# Patient Record
Sex: Male | Born: 1960 | Race: White | Hispanic: No | Marital: Married | Smoking: Current every day smoker
Health system: Southern US, Community
[De-identification: ages and names within clinical notes are randomized; demographics above are authoritative.]

## PROBLEM LIST (undated history)

## (undated) DIAGNOSIS — K219 Gastro-esophageal reflux disease without esophagitis: Secondary | ICD-10-CM

## (undated) DIAGNOSIS — J40 Bronchitis, not specified as acute or chronic: Secondary | ICD-10-CM

## (undated) DIAGNOSIS — E119 Type 2 diabetes mellitus without complications: Secondary | ICD-10-CM

## (undated) DIAGNOSIS — E7800 Pure hypercholesterolemia, unspecified: Secondary | ICD-10-CM

## (undated) DIAGNOSIS — J439 Emphysema, unspecified: Secondary | ICD-10-CM

## (undated) DIAGNOSIS — H269 Unspecified cataract: Secondary | ICD-10-CM

## (undated) DIAGNOSIS — I10 Essential (primary) hypertension: Secondary | ICD-10-CM

## (undated) DIAGNOSIS — M1990 Unspecified osteoarthritis, unspecified site: Secondary | ICD-10-CM

## (undated) HISTORY — DX: Gastro-esophageal reflux disease without esophagitis: K21.9

## (undated) HISTORY — PX: BACK SURGERY: SHX140

## (undated) HISTORY — DX: Unspecified cataract: H26.9

## (undated) HISTORY — PX: EYE SURGERY: SHX253

## (undated) HISTORY — DX: Unspecified osteoarthritis, unspecified site: M19.90

## (undated) HISTORY — DX: Bronchitis, not specified as acute or chronic: J40

## (undated) HISTORY — DX: Emphysema, unspecified: J43.9

## (undated) HISTORY — PX: HERNIA REPAIR: SHX51

---

## 2006-01-27 ENCOUNTER — Ambulatory Visit: Payer: Self-pay | Admitting: Cardiology

## 2007-08-31 ENCOUNTER — Ambulatory Visit (HOSPITAL_COMMUNITY): Admission: RE | Admit: 2007-08-31 | Discharge: 2007-08-31 | Payer: Self-pay | Admitting: Ophthalmology

## 2007-09-14 ENCOUNTER — Ambulatory Visit (HOSPITAL_COMMUNITY): Admission: RE | Admit: 2007-09-14 | Discharge: 2007-09-14 | Payer: Self-pay | Admitting: Ophthalmology

## 2009-02-12 ENCOUNTER — Ambulatory Visit (HOSPITAL_COMMUNITY): Admission: RE | Admit: 2009-02-12 | Discharge: 2009-02-12 | Payer: Self-pay | Admitting: Internal Medicine

## 2009-02-25 ENCOUNTER — Ambulatory Visit (HOSPITAL_COMMUNITY): Admission: RE | Admit: 2009-02-25 | Discharge: 2009-02-25 | Payer: Self-pay | Admitting: Internal Medicine

## 2011-02-09 LAB — BASIC METABOLIC PANEL
Calcium: 9.8
Chloride: 103
Potassium: 4.1

## 2011-11-01 ENCOUNTER — Emergency Department (HOSPITAL_COMMUNITY)
Admission: EM | Admit: 2011-11-01 | Discharge: 2011-11-01 | Disposition: A | Discharge status: Home / Self Care | Payer: Self-pay | Attending: Emergency Medicine | Admitting: Emergency Medicine

## 2011-11-01 ENCOUNTER — Emergency Department (HOSPITAL_COMMUNITY): Payer: Self-pay

## 2011-11-01 ENCOUNTER — Encounter (HOSPITAL_COMMUNITY): Payer: Self-pay | Admitting: *Deleted

## 2011-11-01 DIAGNOSIS — S2249XA Multiple fractures of ribs, unspecified side, initial encounter for closed fracture: Secondary | ICD-10-CM | POA: Insufficient documentation

## 2011-11-01 DIAGNOSIS — X58XXXA Exposure to other specified factors, initial encounter: Secondary | ICD-10-CM | POA: Insufficient documentation

## 2011-11-01 DIAGNOSIS — Z79899 Other long term (current) drug therapy: Secondary | ICD-10-CM | POA: Insufficient documentation

## 2011-11-01 DIAGNOSIS — I10 Essential (primary) hypertension: Secondary | ICD-10-CM | POA: Insufficient documentation

## 2011-11-01 DIAGNOSIS — F17200 Nicotine dependence, unspecified, uncomplicated: Secondary | ICD-10-CM | POA: Insufficient documentation

## 2011-11-01 DIAGNOSIS — E7800 Pure hypercholesterolemia, unspecified: Secondary | ICD-10-CM | POA: Insufficient documentation

## 2011-11-01 DIAGNOSIS — S2239XA Fracture of one rib, unspecified side, initial encounter for closed fracture: Secondary | ICD-10-CM

## 2011-11-01 HISTORY — DX: Essential (primary) hypertension: I10

## 2011-11-01 HISTORY — DX: Pure hypercholesterolemia, unspecified: E78.00

## 2011-11-01 MED ORDER — OXYCODONE-ACETAMINOPHEN 5-325 MG PO TABS: 1.0000 | ORAL_TABLET | ORAL | Status: AC | PRN

## 2011-11-01 MED ORDER — OXYCODONE-ACETAMINOPHEN 5-325 MG PO TABS
1.0000 | ORAL_TABLET | Freq: Once | ORAL | Status: AC
  Administered 2011-11-01: 1 via ORAL
  Filled 2011-11-01: qty 1

## 2011-11-01 NOTE — ED Notes (Signed)
Cough since Friday, pain lt lat ribs esp when coughs.  Nonproductive, No fever.

## 2011-11-01 NOTE — Discharge Instructions (Signed)
Rib Fracture Your caregiver has diagnosed you as having a rib fracture (a break). This can occur by a blow to the chest, by a fall against a hard object, or by violent coughing or sneezing. There may be one or many breaks. Rib fractures may heal on their own within 3 to 8 weeks. The longer healing period is usually associated with a continued cough or other aggravating activities. HOME CARE INSTRUCTIONS   Avoid strenuous activity. Be careful during activities and avoid bumping the injured rib. Activities that cause pain pull on the fracture site(s) and are best avoided if possible.   Eat a normal, well-balanced diet. Drink plenty of fluids to avoid constipation.   Take deep breaths several times a day to keep lungs free of infection. Try to cough several times a day, splinting the injured area with a pillow. This will help prevent pneumonia.   Do not wear a rib belt or binder. These restrict breathing which can lead to pneumonia.   Only take over-the-counter or prescription medicines for pain, discomfort, or fever as directed by your caregiver.  SEEK MEDICAL CARE IF:  You develop a continual cough, associated with thick or bloody sputum. SEEK IMMEDIATE MEDICAL CARE IF:   You have a fever.   You have difficulty breathing.   You have nausea (feeling sick to your stomach), vomiting, or abdominal (belly) pain.   You have worsening pain, not controlled with medications.  Document Released: 05/03/2005 Document Revised: 04/22/2011 Document Reviewed: 10/05/2006 Exit[employer] Patient Information 2012 [employer]. 

## 2011-11-01 NOTE — ED Provider Notes (Signed)
History     CSN: [id number]  Arrival date & time 11/01/11  1916   First MD Initiated Contact with Patient 11/01/11 1926      Chief Complaint  Patient presents with  . Cough    (Consider location/radiation/quality/duration/timing/severity/associated sxs/prior treatment) HPI Comments: Patient c/o localized pain to the lateral left chest wall for three days.  States the pain began after he coughed and felt a sharp pain to his side that worsens with deep breathing, coughing and certain movements.  Pain improves with certain positions.  He denies hemoptysis, fever, dyspnea or productive cough  Patient is a 51 y.o. male presenting with chest pain. The history is provided by the patient.  Chest Pain The chest pain began 3 - 5 days ago. Chest pain occurs constantly. The chest pain is unchanged. The pain is associated with coughing and exertion. The severity of the pain is mild. The quality of the pain is described as dull and sharp. The pain does not radiate. Chest pain is worsened by certain positions, deep breathing and exertion (palpation). Primary symptoms include cough. Pertinent negatives for primary symptoms include no fever, no syncope, no shortness of breath, no wheezing, no palpitations, no abdominal pain, no nausea, no vomiting, no dizziness and no altered mental status. He tried beta-agonist inhalers for the symptoms.  Pertinent negatives for past medical history include no cancer, no diabetes, no MI and no PE.     Past Medical History  Diagnosis Date  . Hypertension   . Hypercholesteremia     Past Surgical History  Procedure Date  . Hernia repair   . Back surgery   . Eye surgery     Family History  Problem Relation Age of Onset  . Cancer Mother     History  Substance Use Topics  . Smoking status: Current Everyday Smoker  . Smokeless tobacco: Not on file  . Alcohol Use: Yes      Review of Systems  Constitutional: Negative for fever and appetite change.    Respiratory: Positive for cough. Negative for chest tightness, shortness of breath and wheezing.   Cardiovascular: Positive for chest pain. Negative for palpitations and syncope.  Gastrointestinal: Negative for nausea, vomiting and abdominal pain.  Musculoskeletal: Negative for back pain.  Skin: Negative.   Neurological: Negative for dizziness.  Psychiatric/Behavioral: Negative for altered mental status.  All other systems reviewed and are negative.    Allergies  Review of patient's allergies indicates no known allergies.  Home Medications   Current Outpatient Rx  Name Route Sig Dispense Refill  . ALBUTEROL SULFATE HFA 108 (90 BASE) MCG/ACT IN AERS Inhalation Inhale 2 puffs into the lungs every 6 (six) hours as needed.    . CYMBALTA PO Oral Take 1 capsule by mouth daily.    Marland Kitchen ESOMEPRAZOLE MAGNESIUM 40 MG PO CPDR Oral Take 40 mg by mouth daily before breakfast.      BP 159/92  Pulse 93  Temp 98.5 F (36.9 C) (Oral)  Resp 20  Ht 5\' 7"  (1.702 m)  Wt 170 lb (77.111 kg)  BMI 26.63 kg/m2  SpO2 97%  Physical Exam  Nursing note and vitals reviewed. Constitutional: He is oriented to person, place, and time. He appears well-developed and well-nourished. No distress.  HENT:  Head: Normocephalic and atraumatic.  Mouth/Throat: Oropharynx is clear and moist.  Cardiovascular: Normal rate, normal heart sounds and intact distal pulses.   No murmur heard. Pulmonary/Chest: Effort normal and breath sounds normal. Not tachypneic. No respiratory distress.  He has no decreased breath sounds. He has no wheezes. He exhibits tenderness and bony tenderness. He exhibits no mass, no laceration, no crepitus, no edema, no deformity, no swelling and no retraction.    Abdominal: Soft. He exhibits no distension. There is no tenderness. There is no rebound and no guarding.  Musculoskeletal: Normal range of motion. He exhibits no edema.  Neurological: He is alert and oriented to person, place, and time.  He exhibits normal muscle tone. Coordination normal.  Skin: Skin is warm and dry.    ED Course  Procedures (including critical care time)  Labs Reviewed - No data to display Dg Ribs Unilateral W/chest Left  11/01/2011  *RADIOLOGY REPORT*  Clinical Data: Cough, left rib pain.  LEFT RIBS AND CHEST - 3+ VIEW  Comparison: 09/13/2011  Findings: There are fractures through the posterior lateral left seventh and eighth ribs.  No pneumothorax.  Heart is borderline in size.  Lungs are clear.  No effusions.  IMPRESSION: Left posterior lateral seventh and eighth rib fractures.  No pneumothorax.  Original Report Authenticated By: Cyndie Chime, M.D.        MDM    Patient has localized tenderness to palpation of the left lateral chest wall. No bruising, abrasions, or crepitus. Patient is not guarding. Vital signs are stable. Patient has a followup appointment with his primary care physician in 2 days. I've advised him to return here for any worsening symptoms.    Patient / Family / Caregiver understand and agree with initial ED impression and plan with expectations set for ED visit. Pt stable in ED with no significant deterioration in condition. Pt feels improved after observation and/or treatment in ED.    Percocet prepack dispensed for home use.   Prescribed: Percocet #24   Adams Hinch L. [place], [place] 11/04/11 2212

## 2011-11-08 MED FILL — Oxycodone w/ Acetaminophen Tab 5-325 MG: ORAL | Qty: 6 | Status: AC

## 2011-11-09 NOTE — ED Provider Notes (Signed)
Medical screening examination/treatment/procedure(s) were performed by non-physician practitioner and as supervising physician I was immediately available for consultation/collaboration.  Donnetta Hutching, MD 11/09/11 1739

## 2013-05-13 ENCOUNTER — Encounter (HOSPITAL_COMMUNITY): Payer: Self-pay | Admitting: Emergency Medicine

## 2013-05-13 ENCOUNTER — Emergency Department (HOSPITAL_COMMUNITY)
Admission: EM | Admit: 2013-05-13 | Discharge: 2013-05-13 | Disposition: A | Discharge status: Home / Self Care | Payer: Self-pay | Attending: Emergency Medicine | Admitting: Emergency Medicine

## 2013-05-13 DIAGNOSIS — Z79899 Other long term (current) drug therapy: Secondary | ICD-10-CM | POA: Insufficient documentation

## 2013-05-13 DIAGNOSIS — L02419 Cutaneous abscess of limb, unspecified: Secondary | ICD-10-CM | POA: Insufficient documentation

## 2013-05-13 DIAGNOSIS — Z862 Personal history of diseases of the blood and blood-forming organs and certain disorders involving the immune mechanism: Secondary | ICD-10-CM | POA: Insufficient documentation

## 2013-05-13 DIAGNOSIS — I10 Essential (primary) hypertension: Secondary | ICD-10-CM | POA: Insufficient documentation

## 2013-05-13 DIAGNOSIS — L02415 Cutaneous abscess of right lower limb: Secondary | ICD-10-CM

## 2013-05-13 DIAGNOSIS — Z8639 Personal history of other endocrine, nutritional and metabolic disease: Secondary | ICD-10-CM | POA: Insufficient documentation

## 2013-05-13 DIAGNOSIS — F17200 Nicotine dependence, unspecified, uncomplicated: Secondary | ICD-10-CM | POA: Insufficient documentation

## 2013-05-13 MED ORDER — DOXYCYCLINE HYCLATE 100 MG PO CAPS: 100.0000 mg | ORAL_CAPSULE | Freq: Two times a day (BID) | ORAL | Status: DC

## 2013-05-13 MED ORDER — DOXYCYCLINE HYCLATE 100 MG PO TABS
100.0000 mg | ORAL_TABLET | Freq: Once | ORAL | Status: AC
  Administered 2013-05-13: 100 mg via ORAL
  Filled 2013-05-13: qty 1

## 2013-05-13 MED ORDER — LIDOCAINE HCL (PF) 2 % IJ SOLN
10.0000 mL | Freq: Once | INTRAMUSCULAR | Status: AC
  Administered 2013-05-13: 10 mL
  Filled 2013-05-13: qty 10

## 2013-05-13 NOTE — ED Notes (Signed)
I have a place on the back of my right leg, spouse says it is a boil per pt.

## 2013-05-13 NOTE — ED Provider Notes (Signed)
CSN: [id number]     Arrival date & time 05/13/13  0106 History   First MD Initiated Contact with Patient 05/13/13 0330     Chief Complaint  Patient presents with  . Recurrent Skin Infections   (Consider location/radiation/quality/duration/timing/severity/associated sxs/prior Treatment) The history is provided by the patient.   52 year old male who has noted a painful knot in the right proximal medial thigh. Has been there for the last 3 weeks. He thought it would cost and get better but it hasn't at night. He denies fever, chills, sweats. It is painful to touch but nothing is actually making it better nothing is making it worse. He's not had similar lesions before.  Past Medical History  Diagnosis Date  . Hypertension   . Hypercholesteremia    Past Surgical History  Procedure Laterality Date  . Hernia repair    . Back surgery    . Eye surgery     Family History  Problem Relation Age of Onset  . Cancer Mother    History  Substance Use Topics  . Smoking status: Current Every Day Smoker  . Smokeless tobacco: Not on file  . Alcohol Use: Yes    Review of Systems  All other systems reviewed and are negative.    Allergies  Review of patient's allergies indicates no known allergies.  Home Medications   Current Outpatient Rx  Name  Route  Sig  Dispense  Refill  . albuterol (PROVENTIL HFA) 108 (90 BASE) MCG/ACT inhaler   Inhalation   Inhale 2 puffs into the lungs every 6 (six) hours as needed.         Marland Kitchen esomeprazole (NEXIUM) 40 MG capsule   Oral   Take 40 mg by mouth daily before breakfast.         . DULoxetine HCl (CYMBALTA PO)   Oral   Take 1 capsule by mouth daily.          BP 113/78  Pulse 87  Temp(Src) 97.9 F (36.6 C) (Oral)  Resp 18  Ht 5\' 7"  (1.702 m)  Wt 160 lb (72.576 kg)  BMI 25.05 kg/m2  SpO2 95% Physical Exam  Nursing note and vitals reviewed.  52 year old male, resting comfortably and in no acute distress. Vital signs are normal. Oxygen  saturation is 95%, which is normal. Head is normocephalic and atraumatic. PERRLA, EOMI. Oropharynx is clear. Neck is nontender and supple without adenopathy or JVD. Back is nontender and there is no CVA tenderness. Lungs are clear without rales, wheezes, or rhonchi. Chest is nontender. Heart has regular rate and rhythm without murmur. Abdomen is soft, flat, nontender without masses or hepatosplenomegaly and peristalsis is normoactive. Extremities have no cyanosis or edema, full range of motion is present. There is an abscess in the right proximal medial thigh with surrounding area of erythema consistent with cellulitis. The abscess is only about 1 cm in diameter. Skin is warm and dry without other rash. Neurologic: Mental status is normal, cranial nerves are intact, there are no motor or sensory deficits.  ED Course  Procedures (including critical care time) INCISION AND DRAINAGE Performed by: WUJWJ,XBJYN Consent: Verbal consent obtained. Risks and benefits: risks, benefits and alternatives were discussed Type: abscess  Body area: right thigh  Anesthesia: local infiltration  Incision was made with a #11 blade scalpel. Perpendicular incisions were made to promote ongoing drainage.  Local anesthetic: lidocaine 2% without epinephrine  Anesthetic total: 3 ml  Complexity: complex Blunt dissection to break up loculations  Drainage: purulent  Drainage amount: moderate  Packing material: none  Patient tolerance: Patient tolerated the procedure well with no immediate complications.     MDM   1. Abscess of right thigh    Abscess of the right thigh. This will be treated with incision and drainage because of surrounding cellulitis, he will be discharged with a prescription for doxycycline. He is encouraged to stop smoking.    Dione Booze, MD 05/13/13 [phone]

## 2014-05-20 ENCOUNTER — Emergency Department (HOSPITAL_COMMUNITY)
Admission: EM | Admit: 2014-05-20 | Discharge: 2014-05-20 | Disposition: A | Discharge status: Home / Self Care | Payer: 59 | Attending: Emergency Medicine | Admitting: Emergency Medicine

## 2014-05-20 ENCOUNTER — Emergency Department (HOSPITAL_COMMUNITY): Payer: 59

## 2014-05-20 ENCOUNTER — Encounter (HOSPITAL_COMMUNITY): Payer: Self-pay | Admitting: Emergency Medicine

## 2014-05-20 DIAGNOSIS — Z791 Long term (current) use of non-steroidal anti-inflammatories (NSAID): Secondary | ICD-10-CM | POA: Diagnosis not present

## 2014-05-20 DIAGNOSIS — Z8639 Personal history of other endocrine, nutritional and metabolic disease: Secondary | ICD-10-CM | POA: Diagnosis not present

## 2014-05-20 DIAGNOSIS — M722 Plantar fascial fibromatosis: Secondary | ICD-10-CM | POA: Insufficient documentation

## 2014-05-20 DIAGNOSIS — Z79899 Other long term (current) drug therapy: Secondary | ICD-10-CM | POA: Diagnosis not present

## 2014-05-20 DIAGNOSIS — M79672 Pain in left foot: Secondary | ICD-10-CM | POA: Diagnosis not present

## 2014-05-20 DIAGNOSIS — I10 Essential (primary) hypertension: Secondary | ICD-10-CM | POA: Insufficient documentation

## 2014-05-20 DIAGNOSIS — R52 Pain, unspecified: Secondary | ICD-10-CM

## 2014-05-20 MED ORDER — NAPROXEN 500 MG PO TABS: 500.0000 mg | ORAL_TABLET | Freq: Two times a day (BID) | ORAL | Status: DC

## 2014-05-20 MED ORDER — HYDROCODONE-ACETAMINOPHEN 5-325 MG PO TABS: 1.0000 | ORAL_TABLET | ORAL | Status: DC | PRN

## 2014-05-20 MED ORDER — HYDROCODONE-ACETAMINOPHEN 5-325 MG PO TABS
1.0000 | ORAL_TABLET | Freq: Once | ORAL | Status: AC
  Administered 2014-05-20: 1 via ORAL
  Filled 2014-05-20: qty 1

## 2014-05-20 NOTE — ED Notes (Signed)
PT c/o left foot pain x7 days with no injury reported. PT states the pain to the middle of foot with ambulation and painful ROM.

## 2014-05-20 NOTE — ED Provider Notes (Signed)
CSN: [id number]     Arrival date & time 05/20/14  1527 History  This chart was scribed for non-physician practitioner, Kerrie Buffalo, NP working with Vida Roller, MD by Gwenyth Ober, ED scribe. This patient was seen in room APFT21/APFT21 and the patient's care was started at 4:43 PM  Chief Complaint  Patient presents with  . Foot Pain   Patient is a 54 y.o. male presenting with lower extremity pain. The history is provided by the patient. No language interpreter was used.  Foot Pain This is a recurrent problem. The current episode started more than 1 week ago. The problem occurs every several days. The problem has not changed since onset.The symptoms are aggravated by walking, exertion and standing. He has tried nothing for the symptoms.    HPI Comments: Larry Floyd is a 54 y.o. male with a history of HTN and high cholesterol who presents to the Emergency Department complaining of recurrent, intermittent, shooting left foot pain that started 1 week ago. Pain becomes worse with walking and movement. He states a history of similar symptoms which started 6 months ago and return intermittently. 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 He denies recent injury and a history of DM. Pt also denies fever, chills and ankle pain as associated symptoms.  Past Medical History  Diagnosis Date  . Hypertension   . Hypercholesteremia    Past Surgical History  Procedure Laterality Date  . Hernia repair    . Back surgery    . Eye surgery     Family History  Problem Relation Age of Onset  . Cancer Mother    History  Substance Use Topics  . Smoking status: Current Every Day Smoker -- 2.00 packs/day    Types: Cigarettes  . Smokeless tobacco: Not on file  . Alcohol Use: Yes    Review of Systems  Constitutional: Negative for fever and chills.  Musculoskeletal: Positive for arthralgias. Negative for joint swelling.       Left foot pain  Skin: Negative for wound.  All other  systems reviewed and are negative.  Allergies  Review of patient's allergies indicates no known allergies.  Home Medications   Prior to Admission medications   Medication Sig Start Date End Date Taking? Authorizing Provider  albuterol (PROVENTIL HFA) 108 (90 BASE) MCG/ACT inhaler Inhale 2 puffs into the lungs every 6 (six) hours as needed.    Historical Provider, MD  doxycycline (VIBRAMYCIN) 100 MG capsule Take 1 capsule (100 mg total) by mouth 2 (two) times daily. 05/13/13   Dione Booze, MD  DULoxetine HCl (CYMBALTA PO) Take 1 capsule by mouth daily.    Historical Provider, MD  esomeprazole (NEXIUM) 40 MG capsule Take 40 mg by mouth daily before breakfast.    Historical Provider, MD  HYDROcodone-acetaminophen (NORCO/VICODIN) 5-325 MG per tablet Take 1 tablet by mouth every 4 (four) hours as needed. 05/20/14   Hope Orlene Och, NP  naproxen (NAPROSYN) 500 MG tablet Take 1 tablet (500 mg total) by mouth 2 (two) times daily. 05/20/14   Hope Orlene Och, NP   BP 158/90 mmHg  Pulse 87  Temp(Src) 98.1 F (36.7 C) (Oral)  Resp 18  Ht  (1.702 m)  Wt 157 lb (71.215 kg)  BMI 24.58 kg/m2  SpO2 100% Physical Exam  Constitutional: He is oriented to person, place, and time. He appears well-developed and well-nourished. No distress.  HENT:  Head: Normocephalic and atraumatic.  Mouth/Throat: Oropharynx is  clear and moist. No oropharyngeal exudate.  Eyes: Pupils are equal, round, and reactive to light.  Neck: Neck supple.  Cardiovascular: Normal rate.   Pulmonary/Chest: Effort normal.  Musculoskeletal: He exhibits no edema.  Slightly decreased sensation on the left foot; DP pulses are equal; strength equal; no swelling; full ROM  Neurological: He is alert and oriented to person, place, and time. No cranial nerve deficit.  Skin: Skin is warm and dry. No rash noted.  Psychiatric: He has a normal mood and affect. His behavior is normal.  Nursing note and vitals reviewed.   ED Course  Procedures  (including critical care time) DIAGNOSTIC STUDIES: Oxygen Saturation is 100% on RA, normal by my interpretation.    COORDINATION OF CARE: 4:49 PM Discussed treatment plan with pt at bedside and pt agreed to plan. Dg Foot Complete Left  05/20/2014   CLINICAL DATA:  Switch left foot pain for the past 7 days. No known injury.  EXAM: LEFT FOOT - COMPLETE 3+ VIEW  COMPARISON:  None.  FINDINGS: There is no evidence of fracture or dislocation. There is no evidence of arthropathy or other focal bone abnormality. Soft tissues are unremarkable.  IMPRESSION: Normal examination.   Electronically Signed   By: Gordan Payment M.D.   On: 05/20/2014 17:29     MDM  54 y.o. male with left foot pain that starts at the toes and goes to the arch of the foot. Stable for discharge without decreased circulation. Ace wrap applied, post op shoe, ice rest, medication for pain and inflammation. Follow up with ortho if symptoms worsen. Discussed with the patient and all questioned fully answered. He will return if any problems arise.    Final diagnoses:  Left foot pain  Plantar fasciitis of left foot    I personally performed the services described in this documentation, which was scribed in my presence. The recorded information has been reviewed and is accurate.   [place], NP 05/20/14 1750  Vida Roller, MD 05/20/14 [phone]

## 2014-07-18 ENCOUNTER — Emergency Department (HOSPITAL_COMMUNITY)
Admission: EM | Admit: 2014-07-18 | Discharge: 2014-07-18 | Disposition: A | Discharge status: Home / Self Care | Payer: Self-pay | Attending: Emergency Medicine | Admitting: Emergency Medicine

## 2014-07-18 ENCOUNTER — Encounter (HOSPITAL_COMMUNITY): Payer: Self-pay | Admitting: *Deleted

## 2014-07-18 ENCOUNTER — Emergency Department (HOSPITAL_COMMUNITY): Payer: Self-pay

## 2014-07-18 ENCOUNTER — Emergency Department (HOSPITAL_COMMUNITY): Payer: 59

## 2014-07-18 DIAGNOSIS — Z792 Long term (current) use of antibiotics: Secondary | ICD-10-CM | POA: Insufficient documentation

## 2014-07-18 DIAGNOSIS — Z8639 Personal history of other endocrine, nutritional and metabolic disease: Secondary | ICD-10-CM | POA: Insufficient documentation

## 2014-07-18 DIAGNOSIS — Z791 Long term (current) use of non-steroidal anti-inflammatories (NSAID): Secondary | ICD-10-CM | POA: Insufficient documentation

## 2014-07-18 DIAGNOSIS — I10 Essential (primary) hypertension: Secondary | ICD-10-CM | POA: Insufficient documentation

## 2014-07-18 DIAGNOSIS — R1010 Upper abdominal pain, unspecified: Secondary | ICD-10-CM | POA: Insufficient documentation

## 2014-07-18 DIAGNOSIS — Z79899 Other long term (current) drug therapy: Secondary | ICD-10-CM | POA: Insufficient documentation

## 2014-07-18 DIAGNOSIS — R109 Unspecified abdominal pain: Secondary | ICD-10-CM

## 2014-07-18 LAB — COMPREHENSIVE METABOLIC PANEL
ALT: 19 U/L (ref 0–53)
AST: 22 U/L (ref 0–37)
Albumin: 4.3 g/dL (ref 3.5–5.2)
Alkaline Phosphatase: 93 U/L (ref 39–117)
Anion gap: 9 (ref 5–15)
BILIRUBIN TOTAL: 0.3 mg/dL (ref 0.3–1.2)
BUN: 9 mg/dL (ref 6–23)
CHLORIDE: 103 mmol/L (ref 96–112)
CO2: 25 mmol/L (ref 19–32)
CREATININE: 0.82 mg/dL (ref 0.50–1.35)
Calcium: 9.5 mg/dL (ref 8.4–10.5)
GFR calc Af Amer: >90 mL/min (ref >90)
Glucose, Bld: 188 mg/dL — ABNORMAL HIGH (ref 70–99)
Potassium: 3.8 mmol/L (ref 3.5–5.1)
SODIUM: 137 mmol/L (ref 135–145)
Total Protein: 8 g/dL (ref 6.0–8.3)

## 2014-07-18 LAB — CBC WITH DIFFERENTIAL/PLATELET
BASOS PCT: 0 % (ref 0–1)
Basophils Absolute: 0 10*3/uL (ref 0.0–0.1)
EOS ABS: 0.4 10*3/uL (ref 0.0–0.7)
EOS PCT: 3 % (ref 0–5)
HCT: 45.2 % (ref 39.0–52.0)
Hemoglobin: 15.4 g/dL (ref 13.0–17.0)
Lymphocytes Relative: 27 % (ref 12–46)
Lymphs Abs: 3.2 10*3/uL (ref 0.7–4.0)
MCH: 29.9 pg (ref 26.0–34.0)
MCHC: 34.1 g/dL (ref 30.0–36.0)
MCV: 87.8 fL (ref 78.0–100.0)
Monocytes Absolute: 0.8 10*3/uL (ref 0.1–1.0)
Monocytes Relative: 7 % (ref 3–12)
NEUTROS PCT: 63 % (ref 43–77)
Neutro Abs: 7.4 10*3/uL (ref 1.7–7.7)
PLATELETS: 319 10*3/uL (ref 150–400)
RBC: 5.15 MIL/uL (ref 4.22–5.81)
RDW: 14.3 % (ref 11.5–15.5)
WBC: 11.8 10*3/uL — ABNORMAL HIGH (ref 4.0–10.5)

## 2014-07-18 LAB — URINALYSIS, ROUTINE W REFLEX MICROSCOPIC
BILIRUBIN URINE: NEGATIVE
Glucose, UA: 100 mg/dL — AB
Hgb urine dipstick: NEGATIVE
KETONES UR: NEGATIVE mg/dL
Leukocytes, UA: NEGATIVE
Nitrite: NEGATIVE
PH: 5.5 (ref 5.0–8.0)
PROTEIN: NEGATIVE mg/dL
Specific Gravity, Urine: 1.01 (ref 1.005–1.030)
UROBILINOGEN UA: 0.2 mg/dL (ref 0.0–1.0)

## 2014-07-18 LAB — LIPASE, BLOOD: Lipase: 46 U/L (ref 11–59)

## 2014-07-18 MED ORDER — FAMOTIDINE 20 MG PO TABS
20.0000 mg | ORAL_TABLET | Freq: Once | ORAL | Status: AC
  Administered 2014-07-18: 20 mg via ORAL
  Filled 2014-07-18: qty 1

## 2014-07-18 MED ORDER — IOHEXOL 300 MG/ML  SOLN
50.0000 mL | Freq: Once | INTRAMUSCULAR | Status: AC | PRN
  Administered 2014-07-18: 50 mL via ORAL

## 2014-07-18 MED ORDER — SODIUM CHLORIDE 0.9 % IJ SOLN
INTRAMUSCULAR | Status: AC
  Filled 2014-07-18: qty 36

## 2014-07-18 MED ORDER — IOHEXOL 300 MG/ML  SOLN
100.0000 mL | Freq: Once | INTRAMUSCULAR | Status: AC | PRN
  Administered 2014-07-18: 100 mL via INTRAVENOUS

## 2014-07-18 MED ORDER — FAMOTIDINE 20 MG PO TABS: 20.0000 mg | ORAL_TABLET | Freq: Two times a day (BID) | ORAL | Status: DC

## 2014-07-18 MED ORDER — MORPHINE SULFATE 4 MG/ML IJ SOLN
2.0000 mg | Freq: Once | INTRAMUSCULAR | Status: AC
  Administered 2014-07-18: 2 mg via INTRAVENOUS
  Filled 2014-07-18: qty 1

## 2014-07-18 NOTE — Discharge Instructions (Signed)
°  Do not take over the counter medicines for pain.  [facility]    Kari BaarsEdward Hawkins MD. Specialty: Pulmonary Disease Contact information: [address]  [address]  [place] [address]  [phone]   Syliva OvermanMargaret Simpson, MD. Specialty: [facility] Contact information: [address]  [place] [address]  [phone]   Lilyan PuntScott Luking, MD. Specialty: [facility] Contact information: [address]  [address]  [place] [address]  [phone]   Avon Gullyesfaye Fanta, MD Specialty: Internal Medicine Contact information: [address]  [place] [address]  [phone]   Catalina PizzaZach Hall, MD. Specialty: Internal Medicine Contact information: [address]  [place] [address]  [phone]   Butch PennyAngus Mcinnis, MD. Specialty: Family Medicine Contact information: [address]  [place] [address]  [phone]   John GiovanniStephen Knowlton, MD. Specialty: Family Medicine Contact information: [address]  [address]  [place] [address]  [phone]   Carylon Perchesoy Fagan, MD. Specialty: Internal Medicine Contact information: [address]  [address]  [place] [address]  [phone]     Abdominal Pain Many things can cause belly (abdominal) pain. Most times, the belly pain is not dangerous. Many cases of belly pain can be watched and treated at home. HOME CARE   Do not take medicines that help you go poop (laxatives) unless told to by your doctor.  Only take medicine as told by your doctor.  Eat or drink as told by your doctor. Your doctor will tell you if you should be on a special diet. GET HELP IF:  You do not know what is causing your belly pain.  You have belly pain while you are sick to your stomach (nauseous) or have runny poop (diarrhea).  You have pain while you pee or poop.  Your belly pain wakes you up at night.  You have belly pain that gets worse or better when you eat.  You have belly pain that gets  worse when you eat fatty foods.  You have a fever. GET HELP RIGHT AWAY IF:   The pain does not go away within 2 hours.  You keep throwing up (vomiting).  The pain changes and is only in the right or left part of the belly.  You have bloody or tarry looking poop. MAKE SURE YOU:   Understand these instructions.  Will watch your condition.  Will get help right away if you are not doing well or get worse. Document Released: 10/20/2007 Document Revised: 05/08/2013 Document Reviewed: 01/10/2013 [facility] Patient Information 2015 [employer] [place] [place]. This information is not intended to replace advice given to you by your health care provider. Make sure you discuss any questions you have with your health care provider.

## 2014-07-18 NOTE — ED Notes (Signed)
abd pain, for 3 weeks, no NVD.  Last bm this AM  No fever.  No urinary sx

## 2014-07-18 NOTE — ED Notes (Signed)
Pt alert & oriented x4, stable gait. Patient given discharge instructions, paperwork & prescription(s). Patient  instructed to stop at the registration desk to finish any additional paperwork. Patient verbalized understanding. Pt left department w/ no further questions. 

## 2014-07-18 NOTE — ED Notes (Signed)
Abdominal Ultrasound in progress at bedside.

## 2014-07-18 NOTE — ED Provider Notes (Signed)
CSN: [id number]     Arrival date & time 07/18/14  1617 History  This chart was scribed for Vida RollerBrian D Theoden Mauch, MD by Bronson CurbJacqueline Melvin, ED Scribe. This patient was seen in room APA16A/APA16A and the patient's care was started at 5:03 PM.     Chief Complaint  Patient presents with  . Abdominal Pain    The history is provided by the patient. No language interpreter was used.     HPI Comments: Larry Floyd is a 54 y.o. male who presents to the Emergency Department complaining of intermittent, 10/10 upper abdominal pain for the past 3 weeks, but is more constant today. Patient notes severe pain that wake him from sleep. Patient notes associated abdominal distention. He is able to tolerate PO. Patient reports occasional EtOH consumption, but denies any pain with EtOH consumption. He states the pain is worse at night. Patient takes Nexium for GERD symptoms, however, he states this does not feel similar to GERD. he denies any prior abdominal surgeries. He further denies dysuria, rectal bleeding, nausea, vomiting, diarrhea, fever, chills, numbness/weakness, or rash.   Past Medical History  Diagnosis Date  . Hypertension   . Hypercholesteremia    Past Surgical History  Procedure Laterality Date  . Hernia repair    . Back surgery    . Eye surgery     Family History  Problem Relation Age of Onset  . Cancer Mother    History  Substance Use Topics  . Smoking status: Current Every Day Smoker -- 2.00 packs/day    Types: Cigarettes  . Smokeless tobacco: Not on file  . Alcohol Use: Yes    Review of Systems  Constitutional: Negative for fever and chills.  Gastrointestinal: Positive for abdominal pain and abdominal distention. Negative for nausea, vomiting, diarrhea and anal bleeding.  Genitourinary: Negative for dysuria.  Skin: Negative for rash.  Neurological: Negative for weakness and numbness.  All other systems reviewed and are negative.     Allergies  Review of patient's allergies  indicates no known allergies.  Home Medications   Prior to Admission medications   Medication Sig Start Date End Date Taking? Authorizing Provider  esomeprazole (NEXIUM) 40 MG capsule Take 40 mg by mouth daily before breakfast.   Yes Historical Provider, MD  doxycycline (VIBRAMYCIN) 100 MG capsule Take 1 capsule (100 mg total) by mouth 2 (two) times daily. Patient not taking: Reported on 07/18/2014 05/13/13   Dione Boozeavid Glick, MD  famotidine (PEPCID) 20 MG tablet Take 1 tablet (20 mg total) by mouth 2 (two) times daily. 07/18/14   Vida RollerBrian D Kurstin Dimarzo, MD  HYDROcodone-acetaminophen (NORCO/VICODIN) 5-325 MG per tablet Take 1 tablet by mouth every 4 (four) hours as needed. Patient not taking: Reported on 07/18/2014 05/20/14   Janne NapoleonHope M Neese, NP  naproxen (NAPROSYN) 500 MG tablet Take 1 tablet (500 mg total) by mouth 2 (two) times daily. Patient not taking: Reported on 07/18/2014 05/20/14   Janne NapoleonHope M Neese, NP   Triage Vitals: BP 158/94 mmHg  Pulse 80  Temp(Src) 98.4 F (36.9 C) (Oral)  Resp 24  Ht 5\' 7"  (1.702 m)  Wt 154 lb (69.854 kg)  BMI 24.11 kg/m2  SpO2 99%  Physical Exam  Constitutional: He appears well-developed and well-nourished. No distress.  HENT:  Head: Normocephalic and atraumatic.  Mouth/Throat: Oropharynx is clear and moist. No oropharyngeal exudate.  Eyes: Conjunctivae and EOM are normal. Pupils are equal, round, and reactive to light. Right eye exhibits no discharge. Left eye exhibits no discharge.  No scleral icterus.  Neck: Normal range of motion. Neck supple. No JVD present. No thyromegaly present.  Cardiovascular: Normal rate, regular rhythm, normal heart sounds and intact distal pulses.  Exam reveals no gallop and no friction rub.   No murmur heard. Pulmonary/Chest: Effort normal and breath sounds normal. No respiratory distress. He has no wheezes. He has no rales.  Abdominal: Soft. Bowel sounds are normal. He exhibits no distension and no mass. There is tenderness. There is no CVA  tenderness and negative Murphy's sign. Hernia confirmed negative in the right inguinal area and confirmed negative in the left inguinal area.  RUQ: No murphy's. No CVA.  Scant tenderness bilateral lower. No inguinal hernia. Ventral hernia palpated.  Musculoskeletal: Normal range of motion. He exhibits no edema or tenderness.  Lymphadenopathy:    He has no cervical adenopathy.  Neurological: He is alert. Coordination normal.  Skin: Skin is warm and dry. No rash noted. No erythema.  Psychiatric: He has a normal mood and affect. His behavior is normal.  Nursing note and vitals reviewed.   ED Course  Procedures (including critical care time)  DIAGNOSTIC STUDIES: Oxygen Saturation is 99% on room air, normal by my interpretation.    COORDINATION OF CARE: At 1707 Discussed treatment plan with patient which includes [place]. Patient agrees.   Labs Review Labs Reviewed  COMPREHENSIVE METABOLIC PANEL - Abnormal; Notable for the following:    Glucose, Bld 188 (*)    All other components within normal limits  CBC WITH DIFFERENTIAL/PLATELET - Abnormal; Notable for the following:    WBC 11.8 (*)    All other components within normal limits  URINALYSIS, ROUTINE W REFLEX MICROSCOPIC - Abnormal; Notable for the following:    Glucose, UA 100 (*)    All other components within normal limits  LIPASE, BLOOD    Imaging Review US Abdomen Complete  07/18/2014   CLINICAL DATA:  Mid upper abdominal pain for 3 weeks.  Worse today.  EXAM: ULTRASOUND ABDOMEN COMPLETE  COMPARISON:  None.  FINDINGS: Gallbladder: No gallstones or wall thickening visualized. No sonographic Murphy sign noted.  Common bile duct: Diameter: 3.6 mm  Liver: No focal lesion identified. Increased hepatic parenchymal echogenicity.  IVC: No abnormality visualized.  Pancreas: Limited visualization secondary to overlying bowel gas.  Spleen: Size and appearance within normal limits.  Right Kidney: Length: 11.9 cm. Echogenicity within normal  limits. No mass or hydronephrosis visualized.  Left Kidney: Length: 11.3 cm. Echogenicity within normal limits. No mass or hydronephrosis visualized.  Abdominal aorta: The abdominal aorta measures 3.1 cm. Evaluation is limited secondary to overlying bowel gas.  Other findings: None.  IMPRESSION: 1. No cholelithiasis or sonographic evidence of acute cholecystitis. 2. Limited evaluation of the pancreas secondary to overlying bowel gas. 3. Increased hepatic echogenicity as can be seen with hepatic steatosis. 4. Abdominal aortic aneurysm measuring 3.1 cm. The overall aorta is limited in evaluation secondary to overlying bowel gas. Recommend followup by ultrasound in 3 years. This recommendation follows ACR consensus guidelines: Geller Paper of the ACR Incidental Findings Committee II on Vascular Findings. Earlyne Iba Radiol 2013; 56:213-086   Electronically Signed   By: Elige Ko   On: 07/18/2014 18:31   Ct Abdomen Pelvis W Contrast  07/18/2014   CLINICAL DATA:  Intermittent subacute onset of severe upper abdominal pain and abdominal distention. Initial encounter.  EXAM: CT ABDOMEN AND PELVIS WITH CONTRAST  TECHNIQUE: Multidetector CT imaging of the abdomen and pelvis was performed using the standard protocol  following bolus administration of intravenous contrast.  CONTRAST:  OMNIPAQUE IOHEXOL 300 MG/ML SOLN, 50mL OMNIPAQUE IOHEXOL 300 MG/ML SOLN  COMPARISON:  Abdominal ultrasound performed earlier today at 5:56 p.m., and MRI of the lumbar spine performed 02/25/2009  FINDINGS: Mild right basilar atelectasis is noted.  The liver and spleen are unremarkable in appearance, aside from a calcified granuloma at the anterior aspect of the spleen. The gallbladder is within normal limits. The pancreas and adrenal glands are unremarkable.  The kidneys are unremarkable in appearance. There is no evidence of hydronephrosis. No renal or ureteral stones are seen. No perinephric stranding is appreciated.  No free fluid is  identified. The small bowel is unremarkable in appearance. The stomach is within normal limits. No acute vascular abnormalities are seen. Scattered calcification is seen along the abdominal aorta and its branches.  The appendix is normal in caliber and contains air, without evidence for appendicitis. Scattered diverticulosis is noted along the distal descending and proximal sigmoid colon, without evidence of diverticulitis.  The bladder is mildly distended and grossly unremarkable. The prostate remains normal in size. No inguinal lymphadenopathy is seen.  No acute osseous abnormalities are identified.  IMPRESSION: 1. No acute abnormality seen within the abdomen or pelvis. 2. Scattered diverticulosis along the distal descending and proximal sigmoid colon, without evidence of diverticulitis. 3. Scattered calcification along the abdominal aorta and its branches. 4. Mild right basilar atelectasis noted.   Electronically Signed   By: Roanna Raider M.D.   On: 07/18/2014 19:42      MDM   Final diagnoses:  Abdominal pain  Abdominal pain    The patient has mild abdominal pain at this time, CT scans and ultrasounds reviewed, no acute findings to suggest a source of the patient's symptoms, he was verbally informed that he had a aneurysm of his aorta, this is small, this is not causing his pain. We'll start on Pepcid, he appears stable for discharge at this time. Vital signs reviewed  Filed Vitals:   07/18/14 1645 07/18/14 1722  BP: 158/94 159/94  Pulse: 80 76  Temp: 98.4 F (36.9 C)   TempSrc: Oral   Resp: 24   Height:  (1.702 m)   Weight: 154 lb (69.854 kg)   SpO2: 99% 95%     I personally performed the services described in this documentation, which was scribed in my presence. The recorded information has been reviewed and is accurate.      Vida Roller, MD 07/18/14 2027

## 2014-07-19 NOTE — ED Notes (Signed)
Wife called requesting note for pt to return to work tomorrow

## 2015-08-27 ENCOUNTER — Emergency Department (HOSPITAL_COMMUNITY)
Admission: EM | Admit: 2015-08-27 | Discharge: 2015-08-27 | Disposition: A | Discharge status: Home / Self Care | Payer: Self-pay | Attending: Emergency Medicine | Admitting: Emergency Medicine

## 2015-08-27 ENCOUNTER — Emergency Department (HOSPITAL_COMMUNITY): Payer: Self-pay

## 2015-08-27 ENCOUNTER — Encounter (HOSPITAL_COMMUNITY): Payer: Self-pay

## 2015-08-27 DIAGNOSIS — R0781 Pleurodynia: Secondary | ICD-10-CM

## 2015-08-27 DIAGNOSIS — F17210 Nicotine dependence, cigarettes, uncomplicated: Secondary | ICD-10-CM | POA: Insufficient documentation

## 2015-08-27 DIAGNOSIS — J40 Bronchitis, not specified as acute or chronic: Secondary | ICD-10-CM | POA: Insufficient documentation

## 2015-08-27 DIAGNOSIS — I10 Essential (primary) hypertension: Secondary | ICD-10-CM | POA: Insufficient documentation

## 2015-08-27 DIAGNOSIS — Z79899 Other long term (current) drug therapy: Secondary | ICD-10-CM | POA: Insufficient documentation

## 2015-08-27 MED ORDER — AZITHROMYCIN 250 MG PO TABS: 250.0000 mg | ORAL_TABLET | Freq: Every day | ORAL | Status: DC

## 2015-08-27 MED ORDER — ALBUTEROL SULFATE HFA 108 (90 BASE) MCG/ACT IN AERS
2.0000 | INHALATION_SPRAY | Freq: Once | RESPIRATORY_TRACT | Status: AC
  Administered 2015-08-27: 2 via RESPIRATORY_TRACT
  Filled 2015-08-27: qty 6.7

## 2015-08-27 MED ORDER — HYDROCODONE-ACETAMINOPHEN 5-325 MG PO TABS
1.0000 | ORAL_TABLET | Freq: Once | ORAL | Status: AC
  Administered 2015-08-27: 1 via ORAL
  Filled 2015-08-27: qty 1

## 2015-08-27 MED ORDER — IBUPROFEN 800 MG PO TABS: 800.0000 mg | ORAL_TABLET | Freq: Three times a day (TID) | ORAL | Status: DC

## 2015-08-27 MED ORDER — GUAIFENESIN-CODEINE 100-10 MG/5ML PO SYRP: 10.0000 mL | ORAL_SOLUTION | Freq: Three times a day (TID) | ORAL | Status: DC | PRN

## 2015-08-27 MED ORDER — IBUPROFEN 800 MG PO TABS
800.0000 mg | ORAL_TABLET | Freq: Once | ORAL | Status: AC
  Administered 2015-08-27: 800 mg via ORAL
  Filled 2015-08-27: qty 1

## 2015-08-27 NOTE — ED Notes (Signed)
Pt reports has had a cough for the past week thinks he broke a rib on the left side when he coughed Friday.

## 2015-08-27 NOTE — ED Provider Notes (Signed)
CSN: [id number]     Arrival date & time 08/27/15  1545 History   First MD Initiated Contact with Patient 08/27/15 1606     Chief Complaint  Patient presents with  . rib pain      (Consider location/radiation/quality/duration/timing/severity/associated sxs/prior Treatment) HPI   Larry Floyd is a 55 y.o. male who presents to the Emergency Department complaining of left rib pain for nearly one week.  He states that he has been coughing frequently and thinks he may have broken a rib.  He describes a sharp stabbing pain to the left lateral chest that is worse with deep breathing and movement, improves at rest.  He denies shortness of breath, fever, chills, abd pain and hemoptysis  Past Medical History  Diagnosis Date  . Hypertension   . Hypercholesteremia    Past Surgical History  Procedure Laterality Date  . Hernia repair    . Back surgery    . Eye surgery     Family History  Problem Relation Age of Onset  . Cancer Mother    Social History  Substance Use Topics  . Smoking status: Current Every Day Smoker -- 2.00 packs/day    Types: Cigarettes  . Smokeless tobacco: None  . Alcohol Use: Yes     Comment: occ    Review of Systems  Constitutional: Negative for fever, chills and appetite change.  HENT: Positive for congestion. Negative for sore throat and trouble swallowing.   Respiratory: Positive for cough. Negative for chest tightness, shortness of breath and wheezing.   Cardiovascular: Negative for chest pain (left rib pain).  Gastrointestinal: Negative for nausea, vomiting and abdominal pain.  Genitourinary: Negative for dysuria.  Musculoskeletal: Negative for arthralgias.  Skin: Negative for rash.  Neurological: Negative for dizziness, weakness and numbness.  Hematological: Negative for adenopathy.  All other systems reviewed and are negative.     Allergies  Review of patient's allergies indicates no known allergies.  Home Medications   Prior to Admission  medications   Medication Sig Start Date End Date Taking? Authorizing Provider  doxycycline (VIBRAMYCIN) 100 MG capsule Take 1 capsule (100 mg total) by mouth 2 (two) times daily. Patient not taking: Reported on 07/18/2014 05/13/13   Dione Booze, MD  esomeprazole (NEXIUM) 40 MG capsule Take 40 mg by mouth daily before breakfast.    Historical Provider, MD  famotidine (PEPCID) 20 MG tablet Take 1 tablet (20 mg total) by mouth 2 (two) times daily. 07/18/14   Eber Hong, MD  HYDROcodone-acetaminophen (NORCO/VICODIN) 5-325 MG per tablet Take 1 tablet by mouth every 4 (four) hours as needed. Patient not taking: Reported on 07/18/2014 05/20/14   Janne Napoleon, NP  naproxen (NAPROSYN) 500 MG tablet Take 1 tablet (500 mg total) by mouth 2 (two) times daily. Patient not taking: Reported on 07/18/2014 05/20/14   Janne Napoleon, NP   BP 160/94 mmHg  Pulse 88  Temp(Src) 97.6 F (36.4 C) (Temporal)  Resp 16  Ht  (1.702 m)  Wt 71.215 kg  BMI 24.58 kg/m2  SpO2 97% Physical Exam  Constitutional: He is oriented to person, place, and time. He appears well-developed and well-nourished. No distress.  HENT:  Head: Normocephalic and atraumatic.  Right Ear: Tympanic membrane and ear canal normal.  Left Ear: Tympanic membrane and ear canal normal.  Mouth/Throat: Uvula is midline, oropharynx is clear and moist and mucous membranes are normal. No oropharyngeal exudate.  Eyes: EOM are normal. Pupils are equal, round, and reactive to light.  Neck: Normal range of motion, full passive range of motion without pain and phonation normal. Neck supple.  Cardiovascular: Normal rate, regular rhythm, normal heart sounds and intact distal pulses.   No murmur heard. Pulmonary/Chest: Effort normal. No stridor. No respiratory distress. He has no wheezes. He has no rales. He exhibits tenderness.  Focal tenderness of the left lateral chest wall.  No crepitus or ecchymosis  Abdominal: Soft. He exhibits no distension. There is no  tenderness. There is no rebound.  Musculoskeletal: He exhibits no edema.  Lymphadenopathy:    He has no cervical adenopathy.  Neurological: He is alert and oriented to person, place, and time. He exhibits normal muscle tone. Coordination normal.  Skin: Skin is warm and dry.  Psychiatric: He has a normal mood and affect.  Nursing note and vitals reviewed.   ED Course  Procedures (including critical care time) Labs Review Labs Reviewed - No data to display  Imaging Review Dg Ribs Unilateral W/chest Left  08/27/2015  CLINICAL DATA:  Left-sided axillary rib pain, 5 days duration. No known injury. EXAM: LEFT RIBS AND CHEST - 3+ VIEW COMPARISON:  11/01/2011 FINDINGS: Heart size is normal. Mediastinal shadows are normal. The lungs are clear except for a linear scar at the right base laterally. No rib fracture seen on the right. On the left, there are old healed fractures at the sixth, seventh and eighth ribs. Skin marker in the region of pain overlies a fracture of the eighth rib anteriorly, age indeterminate. No pneumothorax or hemothorax. IMPRESSION: Old healed fractures of the left sixth, seventh and eighth ribs laterally. Indeterminate age fracture of the left eighth rib anteriorly/ laterally. This appears to be in the region of the patient's symptoms, but my impression would be that this is also an old fracture. Electronically Signed   By: Paulina FusiMark  Shogry M.D.   On: 08/27/2015 16:24    I have personally reviewed and evaluated these images and lab results as part of my medical decision-making.   EKG Interpretation None      MDM   Final diagnoses:  Rib pain on left side  Bronchitis   Pt with focal tenderness of the left lateral chest wall.  Sudden onset after coughing.  Possible acute on chronic fx.  Vitals stable.  No shortness of breath.  He appears stable for d/c and agrees to symptomatic tx and close PMD f/u.  Return precautions also given.     Pauline Ausammy Aalia Greulich, PA-C 08/29/15  1349  Bethann BerkshireJoseph Zammit, MD 08/30/15 [phone]

## 2018-03-16 ENCOUNTER — Ambulatory Visit: Payer: Self-pay | Admitting: Physician Assistant

## 2018-03-20 ENCOUNTER — Other Ambulatory Visit: Payer: Self-pay | Admitting: Physician Assistant

## 2018-03-20 ENCOUNTER — Encounter: Payer: Self-pay | Admitting: Physician Assistant

## 2018-03-20 ENCOUNTER — Ambulatory Visit: Payer: Self-pay | Admitting: Physician Assistant

## 2018-03-20 VITALS — BP 136/76 | HR 75 | Temp 98.2°F | Ht 63.0 in | Wt 156.0 lb

## 2018-03-20 DIAGNOSIS — I10 Essential (primary) hypertension: Secondary | ICD-10-CM

## 2018-03-20 DIAGNOSIS — J449 Chronic obstructive pulmonary disease, unspecified: Secondary | ICD-10-CM

## 2018-03-20 DIAGNOSIS — Z1211 Encounter for screening for malignant neoplasm of colon: Secondary | ICD-10-CM

## 2018-03-20 DIAGNOSIS — E785 Hyperlipidemia, unspecified: Secondary | ICD-10-CM

## 2018-03-20 DIAGNOSIS — F17200 Nicotine dependence, unspecified, uncomplicated: Secondary | ICD-10-CM | POA: Insufficient documentation

## 2018-03-20 DIAGNOSIS — Z125 Encounter for screening for malignant neoplasm of prostate: Secondary | ICD-10-CM

## 2018-03-20 DIAGNOSIS — I152 Hypertension secondary to endocrine disorders: Secondary | ICD-10-CM | POA: Insufficient documentation

## 2018-03-20 DIAGNOSIS — Z7689 Persons encountering health services in other specified circumstances: Secondary | ICD-10-CM

## 2018-03-20 DIAGNOSIS — E1159 Type 2 diabetes mellitus with other circulatory complications: Secondary | ICD-10-CM | POA: Insufficient documentation

## 2018-03-20 DIAGNOSIS — K219 Gastro-esophageal reflux disease without esophagitis: Secondary | ICD-10-CM

## 2018-03-20 DIAGNOSIS — E119 Type 2 diabetes mellitus without complications: Secondary | ICD-10-CM

## 2018-03-20 NOTE — Progress Notes (Signed)
BP 136/76 (BP Location: Left Arm, Patient Position: Sitting, Cuff Size: Normal)   Pulse 75   Temp 98.2 F (36.8 C)   Ht 5\' 3"  (1.6 m)   Wt 156 lb (70.8 kg)   SpO2 98%   BMI 27.63 kg/m    Subjective:    Patient ID: Larry Floyd, male    DOB: 08-11-1960, 57 y.o.   MRN: [id number]  HPI: Larry Floyd is a 57 y.o. male presenting on 03/20/2018 for New Patient (Initial Visit) (previous patient at [facility]. pt last been there about a month ago)   HPI   Chief Complaint  Patient presents with  . New Patient (Initial Visit)    previous patient at [facility]. pt last been there about a month ago   Pt had labs and last went to [facility] first part of October.  He says he was told he was diabetic but he was not given any medication nor counseled on eating.   He says he has had No colon canceer screening since he was here in 2013  Pt works as Electrical engineer  Pt is still smoking 3 ppd.    Relevant past medical, surgical, family and social history reviewed and updated as indicated. Interim medical history since our last visit reviewed. Allergies and medications reviewed and updated.   Current Outpatient Medications:  .  albuterol (PROVENTIL HFA;VENTOLIN HFA) 108 (90 Base) MCG/ACT inhaler, Inhale 2 puffs into the lungs every 6 (six) hours as needed for wheezing or shortness of breath (cough)., Disp: , Rfl:  .  amLODipine-atorvastatin (CADUET) 5-10 MG tablet, Take 1 tablet by mouth daily., Disp: , Rfl:  .  cetirizine (ZYRTEC) 10 MG tablet, Take 5 mg by mouth daily., Disp: , Rfl:  .  gabapentin (NEURONTIN) 600 MG tablet, Take 600 mg by mouth daily., Disp: , Rfl:  .  mometasone-formoterol (DULERA) 100-5 MCG/ACT AERO, Inhale 2 puffs into the lungs 2 (two) times daily., Disp: , Rfl:  .  nicotine (NICOTROL) 10 MG inhaler, Inhale 1 continuous puffing into the lungs as needed for smoking cessation., Disp: , Rfl:  .  omeprazole (PRILOSEC) 20 MG capsule, Take 20 mg by mouth daily.,  Disp: , Rfl:    Review of Systems  Constitutional: Negative for appetite change, chills, diaphoresis, fatigue, fever and unexpected weight change.  HENT: Positive for dental problem. Negative for congestion, drooling, ear pain, facial swelling, hearing loss, mouth sores, sneezing, sore throat, trouble swallowing and voice change.   Eyes: Negative for pain, discharge, redness, itching and visual disturbance.  Respiratory: Negative for cough, choking, shortness of breath and wheezing.   Cardiovascular: Positive for leg swelling. Negative for chest pain and palpitations.  Gastrointestinal: Negative for abdominal pain, blood in stool, constipation, diarrhea and vomiting.  Endocrine: Negative for cold intolerance, heat intolerance and polydipsia.  Genitourinary: Negative for decreased urine volume, dysuria and hematuria.  Musculoskeletal: Positive for back pain. Negative for arthralgias and gait problem.  Skin: Negative for rash.  Allergic/Immunologic: Positive for environmental allergies.  Neurological: Positive for headaches. Negative for seizures, syncope and light-headedness.  Hematological: Negative for adenopathy.  Psychiatric/Behavioral: Negative for agitation, dysphoric mood and suicidal ideas. The patient is nervous/anxious.     Per HPI unless specifically indicated above     Objective:    BP 136/76 (BP Location: Left Arm, Patient Position: Sitting, Cuff Size: Normal)   Pulse 75   Temp 98.2 F (36.8 C)   Ht 5\' 3"  (1.6 m)  Wt 156 lb (70.8 kg)   SpO2 98%   BMI 27.63 kg/m   Wt Readings from Last 3 Encounters:  03/20/18 156 lb (70.8 kg)  08/27/15 157 lb (71.2 kg)  07/18/14 154 lb (69.9 kg)    Physical Exam  Constitutional: He is oriented to person, place, and time. He appears well-developed and well-nourished.  HENT:  Head: Normocephalic and atraumatic.  Mouth/Throat: Oropharynx is clear and moist. No oropharyngeal exudate.  Eyes: Pupils are equal, round, and reactive to  light. Conjunctivae and EOM are normal.  Neck: Neck supple. No thyromegaly present.  Cardiovascular: Normal rate and regular rhythm.  Pulmonary/Chest: Effort normal and breath sounds normal. He has no wheezes. He has no rales.  Abdominal: Soft. Bowel sounds are normal. He exhibits no mass. There is no hepatosplenomegaly. There is no tenderness.  Musculoskeletal: He exhibits no edema.  Lymphadenopathy:    He has no cervical adenopathy.  Neurological: He is alert and oriented to person, place, and time.  Skin: Skin is warm and dry. No rash noted.  Psychiatric: He has a normal mood and affect. His behavior is normal. Thought content normal.  Vitals reviewed.       Assessment & Plan:   Encounter Diagnoses  Name Primary?  . Encounter to establish care Yes  . Essential hypertension   . Hyperlipidemia, unspecified hyperlipidemia type   . Chronic obstructive pulmonary disease, unspecified COPD type (HCC)   . Tobacco use disorder   . Gastroesophageal reflux disease, esophagitis presence not specified   . Screening for colon cancer     -pt was given iFOBT for colon cancer screening -Record request for labs done last month was sent to [facility] clinic -pt to Continue current meds -counseled smoking cessation -pt will follow up in 2 months.  If labs reveal a need, will have pt RTO sooner.  He can RTO sooner if he has a need.

## 2018-04-11 ENCOUNTER — Encounter: Payer: Self-pay | Admitting: Physician Assistant

## 2018-04-18 ENCOUNTER — Other Ambulatory Visit (HOSPITAL_COMMUNITY)
Admission: RE | Admit: 2018-04-18 | Discharge: 2018-04-18 | Disposition: A | Discharge status: Home / Self Care | Payer: Self-pay | Source: Ambulatory Visit | Attending: Physician Assistant | Admitting: Physician Assistant

## 2018-04-18 DIAGNOSIS — Z125 Encounter for screening for malignant neoplasm of prostate: Secondary | ICD-10-CM | POA: Insufficient documentation

## 2018-04-18 DIAGNOSIS — I10 Essential (primary) hypertension: Secondary | ICD-10-CM | POA: Insufficient documentation

## 2018-04-18 DIAGNOSIS — E785 Hyperlipidemia, unspecified: Secondary | ICD-10-CM | POA: Insufficient documentation

## 2018-04-18 DIAGNOSIS — E119 Type 2 diabetes mellitus without complications: Secondary | ICD-10-CM | POA: Insufficient documentation

## 2018-04-18 LAB — HEMOGLOBIN A1C
Hgb A1c MFr Bld: 6.8 % — ABNORMAL HIGH (ref 4.8–5.6)
Mean Plasma Glucose: 148.46 mg/dL

## 2018-04-18 LAB — COMPREHENSIVE METABOLIC PANEL
ALT: 18 U/L (ref 0–44)
AST: 17 U/L (ref 15–41)
Albumin: 4.6 g/dL (ref 3.5–5.0)
Alkaline Phosphatase: 120 U/L (ref 38–126)
Anion gap: 8 (ref 5–15)
BUN: 6 mg/dL (ref 6–20)
CALCIUM: 9.6 mg/dL (ref 8.9–10.3)
CO2: 26 mmol/L (ref 22–32)
Chloride: 103 mmol/L (ref 98–111)
Creatinine, Ser: 0.76 mg/dL (ref 0.61–1.24)
GFR calc Af Amer: >60 mL/min (ref >60)
Glucose, Bld: 123 mg/dL — ABNORMAL HIGH (ref 70–99)
Potassium: 4 mmol/L (ref 3.5–5.1)
Sodium: 137 mmol/L (ref 135–145)
Total Bilirubin: 0.6 mg/dL (ref 0.3–1.2)
Total Protein: 9 g/dL — ABNORMAL HIGH (ref 6.5–8.1)

## 2018-04-18 LAB — LIPID PANEL
Cholesterol: 158 mg/dL (ref 0–200)
HDL: 34 mg/dL — ABNORMAL LOW (ref >40)
LDL CALC: 94 mg/dL (ref 0–99)
TRIGLYCERIDES: 150 mg/dL — AB (ref <150)
Total CHOL/HDL Ratio: 4.6 RATIO
VLDL: 30 mg/dL (ref 0–40)

## 2018-04-18 LAB — PSA: PROSTATIC SPECIFIC ANTIGEN: 0.49 ng/mL (ref 0.00–4.00)

## 2018-04-25 ENCOUNTER — Encounter: Payer: Self-pay | Admitting: Physician Assistant

## 2018-04-25 ENCOUNTER — Ambulatory Visit: Payer: Self-pay | Admitting: Physician Assistant

## 2018-04-25 VITALS — BP 126/82 | HR 83 | Temp 97.9°F | Ht 63.0 in | Wt 157.5 lb

## 2018-04-25 DIAGNOSIS — F17200 Nicotine dependence, unspecified, uncomplicated: Secondary | ICD-10-CM

## 2018-04-25 DIAGNOSIS — I10 Essential (primary) hypertension: Secondary | ICD-10-CM

## 2018-04-25 DIAGNOSIS — K219 Gastro-esophageal reflux disease without esophagitis: Secondary | ICD-10-CM

## 2018-04-25 DIAGNOSIS — E119 Type 2 diabetes mellitus without complications: Secondary | ICD-10-CM

## 2018-04-25 DIAGNOSIS — J449 Chronic obstructive pulmonary disease, unspecified: Secondary | ICD-10-CM

## 2018-04-25 DIAGNOSIS — E785 Hyperlipidemia, unspecified: Secondary | ICD-10-CM

## 2018-04-25 DIAGNOSIS — F17210 Nicotine dependence, cigarettes, uncomplicated: Secondary | ICD-10-CM

## 2018-04-25 LAB — IFOBT (OCCULT BLOOD): IMMUNOLOGICAL FECAL OCCULT BLOOD TEST: NEGATIVE

## 2018-04-25 MED ORDER — METFORMIN HCL ER 500 MG PO TB24: 500.0000 mg | ORAL_TABLET | Freq: Every day | ORAL | 1 refills | Status: DC

## 2018-04-25 MED ORDER — MOMETASONE FURO-FORMOTEROL FUM 100-5 MCG/ACT IN AERO: 2.0000 | INHALATION_SPRAY | Freq: Two times a day (BID) | RESPIRATORY_TRACT | 1 refills | Status: DC

## 2018-04-25 MED ORDER — GABAPENTIN 600 MG PO TABS: ORAL_TABLET | ORAL | 1 refills | Status: DC

## 2018-04-25 MED ORDER — ALBUTEROL SULFATE HFA 108 (90 BASE) MCG/ACT IN AERS: 2.0000 | INHALATION_SPRAY | Freq: Four times a day (QID) | RESPIRATORY_TRACT | 1 refills | Status: DC | PRN

## 2018-04-25 MED ORDER — CETIRIZINE HCL 10 MG PO TABS: 5.0000 mg | ORAL_TABLET | Freq: Every day | ORAL | 1 refills | Status: DC

## 2018-04-25 MED ORDER — AMLODIPINE-ATORVASTATIN 5-10 MG PO TABS: 1.0000 | ORAL_TABLET | Freq: Every day | ORAL | 1 refills | Status: DC

## 2018-04-25 MED ORDER — OMEPRAZOLE 20 MG PO CPDR: 20.0000 mg | DELAYED_RELEASE_CAPSULE | Freq: Every day | ORAL | 1 refills | Status: DC

## 2018-04-25 NOTE — Patient Instructions (Signed)

## 2018-04-25 NOTE — Progress Notes (Signed)
BP 126/82 (BP Location: Right Arm, Patient Position: Sitting, Cuff Size: Normal)   Pulse 83   Temp 97.9 F (36.6 C)   Ht 5\' 3"  (1.6 m)   Wt 157 lb 8 oz (71.4 kg)   SpO2 97%   BMI 27.90 kg/m    Subjective:    Patient ID: Larry Floyd, male    DOB: [birth year], 57 y.o.   MRN: [id number]  HPI: Larry Floyd is a 57 y.o. male presenting on 04/25/2018 for Hyperlipidemia; Hypertension; and COPD   HPI  Pt still smoking about 2 1/2 ppd  Pt has no complaints today.    Relevant past medical, surgical, family and social history reviewed and updated as indicated. Interim medical history since our last visit reviewed. Allergies and medications reviewed and updated.   Current Outpatient Medications:  .  albuterol (PROVENTIL HFA;VENTOLIN HFA) 108 (90 Base) MCG/ACT inhaler, Inhale 2 puffs into the lungs every 6 (six) hours as needed for wheezing or shortness of breath (cough)., Disp: , Rfl:  .  amLODipine-atorvastatin (CADUET) 5-10 MG tablet, Take 1 tablet by mouth daily., Disp: , Rfl:  .  cetirizine (ZYRTEC) 10 MG tablet, Take 5 mg by mouth daily., Disp: , Rfl:  .  gabapentin (NEURONTIN) 600 MG tablet, Take 600 mg by mouth daily., Disp: , Rfl:  .  mometasone-formoterol (DULERA) 100-5 MCG/ACT AERO, Inhale 2 puffs into the lungs 2 (two) times daily., Disp: , Rfl:  .  nicotine (NICOTROL) 10 MG inhaler, Inhale 1 continuous puffing into the lungs as needed for smoking cessation., Disp: , Rfl:  .  omeprazole (PRILOSEC) 20 MG capsule, Take 20 mg by mouth daily., Disp: , Rfl:      Review of Systems  Constitutional: Negative for appetite change, chills, diaphoresis, fatigue, fever and unexpected weight change.  HENT: Positive for sneezing. Negative for congestion, dental problem, drooling, ear pain, facial swelling, hearing loss, mouth sores, sore throat, trouble swallowing and voice change.   Eyes: Negative for pain, discharge, redness, itching and visual disturbance.  Respiratory:  Negative for cough, choking, shortness of breath and wheezing.   Cardiovascular: Negative for chest pain, palpitations and leg swelling.  Gastrointestinal: Negative for abdominal pain, blood in stool, constipation, diarrhea and vomiting.  Endocrine: Negative for cold intolerance, heat intolerance and polydipsia.  Genitourinary: Negative for decreased urine volume, dysuria and hematuria.  Musculoskeletal: Positive for gait problem. Negative for arthralgias and back pain.  Skin: Negative for rash.  Allergic/Immunologic: Negative for environmental allergies.  Neurological: Negative for seizures, syncope, light-headedness and headaches.  Hematological: Negative for adenopathy.  Psychiatric/Behavioral: Negative for agitation, dysphoric mood and suicidal ideas. The patient is not nervous/anxious.     Per HPI unless specifically indicated above     Objective:    BP 126/82 (BP Location: Right Arm, Patient Position: Sitting, Cuff Size: Normal)   Pulse 83   Temp 97.9 F (36.6 C)   Ht 5\' 3"  (1.6 m)   Wt 157 lb 8 oz (71.4 kg)   SpO2 97%   BMI 27.90 kg/m   Wt Readings from Last 3 Encounters:  04/25/18 157 lb 8 oz (71.4 kg)  03/20/18 156 lb (70.8 kg)  08/27/15 157 lb (71.2 kg)    Physical Exam Vitals signs reviewed.  Constitutional:      Appearance: He is well-developed.  HENT:     Head: Normocephalic and atraumatic.  Neck:     Musculoskeletal: Neck supple.  Cardiovascular:     Rate and Rhythm: Normal rate  and regular rhythm.  Pulmonary:     Effort: Pulmonary effort is normal.     Breath sounds: Normal breath sounds. No wheezing.  Abdominal:     General: Bowel sounds are normal.     Palpations: Abdomen is soft.     Tenderness: There is no abdominal tenderness.  Lymphadenopathy:     Cervical: No cervical adenopathy.  Skin:    General: Skin is warm and dry.  Neurological:     Mental Status: He is alert and oriented to person, place, and time.  Psychiatric:        Behavior:  Behavior normal.     Results for orders placed or performed during the hospital encounter of 04/18/18  Lipid panel  Result Value Ref Range   Cholesterol 158 0 - 200 mg/dL   Triglycerides 409 (H) <150 mg/dL   HDL 34 (L) >81 mg/dL   Total CHOL/HDL Ratio 4.6 RATIO   VLDL 30 0 - 40 mg/dL   LDL Cholesterol 94 0 - 99 mg/dL  PSA  Result Value Ref Range   Prostatic Specific Antigen 0.49 0.00 - 4.00 ng/mL  Comprehensive metabolic panel  Result Value Ref Range   Sodium 137 135 - 145 mmol/L   Potassium 4.0 3.5 - 5.1 mmol/L   Chloride 103 98 - 111 mmol/L   CO2 26 22 - 32 mmol/L   Glucose, Bld 123 (H) 70 - 99 mg/dL   BUN 6 6 - 20 mg/dL   Creatinine, Ser 1.91 0.61 - 1.24 mg/dL   Calcium 9.6 8.9 - 47.8 mg/dL   Total Protein 9.0 (H) 6.5 - 8.1 g/dL   Albumin 4.6 3.5 - 5.0 g/dL   AST 17 15 - 41 U/L   ALT 18 0 - 44 U/L   Alkaline Phosphatase 120 38 - 126 U/L   Total Bilirubin 0.6 0.3 - 1.2 mg/dL   GFR calc non Af Amer >60 >60 mL/min   GFR calc Af Amer >60 >60 mL/min   Anion gap 8 5 - 15  Hemoglobin A1c  Result Value Ref Range   Hgb A1c MFr Bld 6.8 (H) 4.8 - 5.6 %   Mean Plasma Glucose 148.46 mg/dL      Assessment & Plan:   Encounter Diagnoses  Name Primary?  . Controlled type 2 diabetes mellitus without complication, without long-term current use of insulin (HCC) Yes  . Essential hypertension   . Hyperlipidemia, unspecified hyperlipidemia type   . Chronic obstructive pulmonary disease, unspecified COPD type (HCC)   . Tobacco use disorder   . Heavy cigarette smoker   . Gastroesophageal reflux disease, esophagitis presence not specified      -Reviewed labs with pt -discussed diagnosis of Diabetes with pt-   rx metformin and counseled pt on diet and lifestyle.  Will mail reading information -counseled Smoking cessation.  Discussed that it is even more important now with the diagnosis of DM -pt to continue other medications -pt to follow up 3 months.  He is to RTO sooner for any  problems with medications or anything else

## 2018-05-01 DIAGNOSIS — E119 Type 2 diabetes mellitus without complications: Secondary | ICD-10-CM | POA: Insufficient documentation

## 2018-05-01 DIAGNOSIS — E1140 Type 2 diabetes mellitus with diabetic neuropathy, unspecified: Secondary | ICD-10-CM | POA: Insufficient documentation

## 2018-06-27 ENCOUNTER — Ambulatory Visit: Payer: Self-pay | Admitting: Physician Assistant

## 2018-07-24 ENCOUNTER — Other Ambulatory Visit (HOSPITAL_COMMUNITY)
Admission: RE | Admit: 2018-07-24 | Discharge: 2018-07-24 | Disposition: A | Discharge status: Home / Self Care | Payer: Self-pay | Source: Ambulatory Visit | Attending: Physician Assistant | Admitting: Physician Assistant

## 2018-07-24 DIAGNOSIS — E785 Hyperlipidemia, unspecified: Secondary | ICD-10-CM | POA: Insufficient documentation

## 2018-07-24 DIAGNOSIS — I10 Essential (primary) hypertension: Secondary | ICD-10-CM | POA: Insufficient documentation

## 2018-07-24 DIAGNOSIS — E119 Type 2 diabetes mellitus without complications: Secondary | ICD-10-CM | POA: Insufficient documentation

## 2018-07-24 LAB — COMPREHENSIVE METABOLIC PANEL
ALT: 24 U/L (ref 0–44)
AST: 20 U/L (ref 15–41)
Albumin: 4.4 g/dL (ref 3.5–5.0)
Alkaline Phosphatase: 103 U/L (ref 38–126)
Anion gap: 8 (ref 5–15)
BUN: 10 mg/dL (ref 6–20)
CO2: 25 mmol/L (ref 22–32)
CREATININE: 0.72 mg/dL (ref 0.61–1.24)
Calcium: 9.8 mg/dL (ref 8.9–10.3)
Chloride: 103 mmol/L (ref 98–111)
GFR calc Af Amer: >60 mL/min (ref >60)
GFR calc non Af Amer: >60 mL/min (ref >60)
Glucose, Bld: 100 mg/dL — ABNORMAL HIGH (ref 70–99)
Potassium: 3.7 mmol/L (ref 3.5–5.1)
Sodium: 136 mmol/L (ref 135–145)
Total Bilirubin: 0.3 mg/dL (ref 0.3–1.2)
Total Protein: 8.1 g/dL (ref 6.5–8.1)

## 2018-07-24 LAB — LIPID PANEL
Cholesterol: 173 mg/dL (ref 0–200)
HDL: 32 mg/dL — ABNORMAL LOW (ref >40)
LDL Cholesterol: 74 mg/dL (ref 0–99)
Total CHOL/HDL Ratio: 5.4 RATIO
Triglycerides: 337 mg/dL — ABNORMAL HIGH (ref <150)
VLDL: 67 mg/dL — ABNORMAL HIGH (ref 0–40)

## 2018-07-24 LAB — HEMOGLOBIN A1C
Hgb A1c MFr Bld: 7.1 % — ABNORMAL HIGH (ref 4.8–5.6)
Mean Plasma Glucose: 157.07 mg/dL

## 2018-07-25 LAB — MICROALBUMIN, URINE: Microalb, Ur: <3 ug/mL — ABNORMAL HIGH

## 2018-07-31 ENCOUNTER — Ambulatory Visit: Payer: Self-pay | Admitting: Physician Assistant

## 2018-07-31 ENCOUNTER — Other Ambulatory Visit: Payer: Self-pay

## 2018-07-31 ENCOUNTER — Encounter: Payer: Self-pay | Admitting: Physician Assistant

## 2018-07-31 VITALS — BP 136/80 | HR 81 | Temp 100.0°F | Ht 63.0 in | Wt 158.5 lb

## 2018-07-31 DIAGNOSIS — K219 Gastro-esophageal reflux disease without esophagitis: Secondary | ICD-10-CM

## 2018-07-31 DIAGNOSIS — J449 Chronic obstructive pulmonary disease, unspecified: Secondary | ICD-10-CM

## 2018-07-31 DIAGNOSIS — F17200 Nicotine dependence, unspecified, uncomplicated: Secondary | ICD-10-CM

## 2018-07-31 DIAGNOSIS — E119 Type 2 diabetes mellitus without complications: Secondary | ICD-10-CM

## 2018-07-31 DIAGNOSIS — E785 Hyperlipidemia, unspecified: Secondary | ICD-10-CM

## 2018-07-31 DIAGNOSIS — I10 Essential (primary) hypertension: Secondary | ICD-10-CM

## 2018-07-31 MED ORDER — GABAPENTIN 600 MG PO TABS: ORAL_TABLET | ORAL | 4 refills | Status: DC

## 2018-07-31 NOTE — Progress Notes (Signed)
BP 136/80 (BP Location: Right Arm, Patient Position: Sitting, Cuff Size: Normal)   Pulse 81   Temp 100 F (37.8 C)   Ht 5\' 3"  (1.6 m)   Wt 158 lb 8 oz (71.9 kg)   SpO2 96%   BMI 28.08 kg/m    Subjective:    Patient ID: Larry Floyd, male    DOB: December 13, 1960, 58 y.o.   MRN: [id number]  HPI: Larry Floyd is a 58 y.o. male presenting on 07/31/2018 for Diabetes; Hyperlipidemia; Hypertension; and COPD   HPI   Chief Complaint  Patient presents with  . Diabetes  . Hyperlipidemia  . Hypertension  . COPD     Pt is doing well and has no complaints.  He is still smoking.   Relevant past medical, surgical, family and social history reviewed and updated as indicated. Interim medical history since our last visit reviewed. Allergies and medications reviewed and updated.   Current Outpatient Medications:  .  albuterol (PROVENTIL HFA;VENTOLIN HFA) 108 (90 Base) MCG/ACT inhaler, Inhale 2 puffs into the lungs every 6 (six) hours as needed for wheezing or shortness of breath (cough)., Disp: 3 Inhaler, Rfl: 1 .  amLODipine-atorvastatin (CADUET) 5-10 MG tablet, Take 1 tablet by mouth daily., Disp: 90 tablet, Rfl: 1 .  cetirizine (ZYRTEC) 10 MG tablet, Take 0.5 tablets (5 mg total) by mouth daily., Disp: 90 tablet, Rfl: 1 .  gabapentin (NEURONTIN) 600 MG tablet, 1 po qd at 5pm, Disp: 90 tablet, Rfl: 1 .  metFORMIN (GLUCOPHAGE-XR) 500 MG 24 hr tablet, Take 1 tablet (500 mg total) by mouth daily with breakfast., Disp: 90 tablet, Rfl: 1 .  mometasone-formoterol (DULERA) 100-5 MCG/ACT AERO, Inhale 2 puffs into the lungs 2 (two) times daily., Disp: 3 Inhaler, Rfl: 1 .  nicotine (NICOTROL) 10 MG inhaler, Inhale 1 continuous puffing into the lungs as needed for smoking cessation., Disp: , Rfl:  .  omeprazole (PRILOSEC) 20 MG capsule, Take 1 capsule (20 mg total) by mouth daily., Disp: 90 capsule, Rfl: 1    Review of Systems  Respiratory: Negative for shortness of breath.   Cardiovascular:  Negative for chest pain.  Gastrointestinal: Negative for abdominal pain.    Per HPI unless specifically indicated above     Objective:    BP 136/80 (BP Location: Right Arm, Patient Position: Sitting, Cuff Size: Normal)   Pulse 81   Temp 100 F (37.8 C)   Ht 5\' 3"  (1.6 m)   Wt 158 lb 8 oz (71.9 kg)   SpO2 96%   BMI 28.08 kg/m   Wt Readings from Last 3 Encounters:  07/31/18 158 lb 8 oz (71.9 kg)  04/25/18 157 lb 8 oz (71.4 kg)  03/20/18 156 lb (70.8 kg)    Physical Exam Vitals signs reviewed.  Constitutional:      Appearance: He is well-developed.  HENT:     Head: Normocephalic and atraumatic.  Neck:     Musculoskeletal: Neck supple.  Cardiovascular:     Rate and Rhythm: Normal rate and regular rhythm.  Pulmonary:     Effort: Pulmonary effort is normal.     Breath sounds: Normal breath sounds. No wheezing.  Abdominal:     General: Bowel sounds are normal.     Palpations: Abdomen is soft.     Tenderness: There is no abdominal tenderness.  Musculoskeletal:     Right lower leg: No edema.     Left lower leg: No edema.  Lymphadenopathy:  Cervical: No cervical adenopathy.  Skin:    General: Skin is warm and dry.  Neurological:     Mental Status: He is alert and oriented to person, place, and time.  Psychiatric:        Behavior: Behavior normal.     Results for orders placed or performed during the hospital encounter of 07/24/18  Microalbumin, urine  Result Value Ref Range   Microalb, Ur <3.0 (H) Not Estab. ug/mL  Lipid panel  Result Value Ref Range   Cholesterol 173 0 - 200 mg/dL   Triglycerides 194 (H) <150 mg/dL   HDL 32 (L) >17 mg/dL   Total CHOL/HDL Ratio 5.4 RATIO   VLDL 67 (H) 0 - 40 mg/dL   LDL Cholesterol 74 0 - 99 mg/dL  Comprehensive metabolic panel  Result Value Ref Range   Sodium 136 135 - 145 mmol/L   Potassium 3.7 3.5 - 5.1 mmol/L   Chloride 103 98 - 111 mmol/L   CO2 25 22 - 32 mmol/L   Glucose, Bld 100 (H) 70 - 99 mg/dL   BUN 10 6 -  20 mg/dL   Creatinine, Ser 4.08 0.61 - 1.24 mg/dL   Calcium 9.8 8.9 - 14.4 mg/dL   Total Protein 8.1 6.5 - 8.1 g/dL   Albumin 4.4 3.5 - 5.0 g/dL   AST 20 15 - 41 U/L   ALT 24 0 - 44 U/L   Alkaline Phosphatase 103 38 - 126 U/L   Total Bilirubin 0.3 0.3 - 1.2 mg/dL   GFR calc non Af Amer >60 >60 mL/min   GFR calc Af Amer >60 >60 mL/min   Anion gap 8 5 - 15  Hemoglobin A1c  Result Value Ref Range   Hgb A1c MFr Bld 7.1 (H) 4.8 - 5.6 %   Mean Plasma Glucose 157.07 mg/dL      Assessment & Plan:   Encounter Diagnoses  Name Primary?  . Controlled type 2 diabetes mellitus without complication, without long-term current use of insulin (HCC) Yes  . Essential hypertension   . Hyperlipidemia, unspecified hyperlipidemia type   . Chronic obstructive pulmonary disease, unspecified COPD type (HCC)   . Tobacco use disorder   . Gastroesophageal reflux disease, esophagitis presence not specified      -reviewed lab with pt -pt to continue current medications -counseled smoking cessation -pt to follow up 3 months.  RTO sooner prn

## 2018-10-06 ENCOUNTER — Other Ambulatory Visit: Payer: Self-pay | Admitting: Physician Assistant

## 2018-10-25 ENCOUNTER — Other Ambulatory Visit (HOSPITAL_COMMUNITY)
Admission: RE | Admit: 2018-10-25 | Discharge: 2018-10-25 | Disposition: A | Discharge status: Home / Self Care | Payer: Self-pay | Source: Ambulatory Visit | Attending: Physician Assistant | Admitting: Physician Assistant

## 2018-10-25 DIAGNOSIS — I10 Essential (primary) hypertension: Secondary | ICD-10-CM | POA: Insufficient documentation

## 2018-10-25 DIAGNOSIS — E119 Type 2 diabetes mellitus without complications: Secondary | ICD-10-CM | POA: Insufficient documentation

## 2018-10-25 DIAGNOSIS — E785 Hyperlipidemia, unspecified: Secondary | ICD-10-CM | POA: Insufficient documentation

## 2018-10-25 LAB — LIPID PANEL
Cholesterol: 143 mg/dL (ref 0–200)
HDL: 32 mg/dL — ABNORMAL LOW (ref >40)
LDL Cholesterol: 70 mg/dL (ref 0–99)
Total CHOL/HDL Ratio: 4.5 RATIO
Triglycerides: 206 mg/dL — ABNORMAL HIGH (ref <150)
VLDL: 41 mg/dL — ABNORMAL HIGH (ref 0–40)

## 2018-10-25 LAB — COMPREHENSIVE METABOLIC PANEL
ALT: 28 U/L (ref 0–44)
AST: 21 U/L (ref 15–41)
Albumin: 4.3 g/dL (ref 3.5–5.0)
Alkaline Phosphatase: 112 U/L (ref 38–126)
Anion gap: 9 (ref 5–15)
BUN: 11 mg/dL (ref 6–20)
CO2: 25 mmol/L (ref 22–32)
Calcium: 9.5 mg/dL (ref 8.9–10.3)
Chloride: 102 mmol/L (ref 98–111)
Creatinine, Ser: 0.68 mg/dL (ref 0.61–1.24)
GFR calc Af Amer: >60 mL/min (ref >60)
GFR calc non Af Amer: >60 mL/min (ref >60)
Glucose, Bld: 94 mg/dL (ref 70–99)
Potassium: 4 mmol/L (ref 3.5–5.1)
Sodium: 136 mmol/L (ref 135–145)
Total Bilirubin: 0.3 mg/dL (ref 0.3–1.2)
Total Protein: 8 g/dL (ref 6.5–8.1)

## 2018-10-25 LAB — HEMOGLOBIN A1C
Hgb A1c MFr Bld: 7 % — ABNORMAL HIGH (ref 4.8–5.6)
Mean Plasma Glucose: 154.2 mg/dL

## 2018-10-30 ENCOUNTER — Encounter: Payer: Self-pay | Admitting: Physician Assistant

## 2018-10-30 ENCOUNTER — Ambulatory Visit: Payer: Self-pay | Admitting: Physician Assistant

## 2018-10-30 DIAGNOSIS — I10 Essential (primary) hypertension: Secondary | ICD-10-CM

## 2018-10-30 DIAGNOSIS — E119 Type 2 diabetes mellitus without complications: Secondary | ICD-10-CM

## 2018-10-30 DIAGNOSIS — F17200 Nicotine dependence, unspecified, uncomplicated: Secondary | ICD-10-CM

## 2018-10-30 DIAGNOSIS — E785 Hyperlipidemia, unspecified: Secondary | ICD-10-CM

## 2018-10-30 DIAGNOSIS — J449 Chronic obstructive pulmonary disease, unspecified: Secondary | ICD-10-CM

## 2018-10-30 NOTE — Progress Notes (Signed)
There were no vitals taken for this visit.   Subjective:    Patient ID: Larry Floyd, male    DOB: Nov 18, 1960, 58 y.o.   MRN: [id number]  HPI: Larry Floyd is a 58 y.o. male presenting on 10/30/2018 for No chief complaint on file.   HPI    This is a telemedicine visit due to coronavirus pandemic.  It is via Telephone as pt does not have a mobile phone with video capabilities   I connected with  Larry Floyd on 10/30/18 by a video enabled telemedicine application and verified that I am speaking with the correct person using two identifiers.   I discussed the limitations of evaluation and management by telemedicine. The patient expressed understanding and agreed to proceed.   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  Provider is at office  Pt says he never wears a mask.  He says his wife doesn't either.   Pt says he is doing well and has no complaints  He is continuing to smoke.  He says the nicotrol has helped him to cut back however.     Relevant past medical, surgical, family and social history reviewed and updated as indicated. Interim medical history since our last visit reviewed. Allergies and medications reviewed and updated.   Current Outpatient Medications:  .  albuterol (PROVENTIL HFA;VENTOLIN HFA) 108 (90 Base) MCG/ACT inhaler, Inhale 2 puffs into the lungs every 6 (six) hours as needed for wheezing or shortness of breath (cough)., Disp: 3 Inhaler, Rfl: 1 .  amLODipine-atorvastatin (CADUET) 5-10 MG tablet, Take 1 tablet by mouth daily., Disp: 90 tablet, Rfl: 1 .  cetirizine (ZYRTEC) 10 MG tablet, Take 0.5 tablets (5 mg total) by mouth daily., Disp: 90 tablet, Rfl: 1 .  gabapentin (NEURONTIN) 600 MG tablet, 1 po qd at 5pm, Disp: 30 tablet, Rfl: 4 .  metFORMIN (GLUCOPHAGE-XR) 500 MG 24 hr tablet, TAKE 1 Tablet BY MOUTH ONCE DAILY WITH BREAKFAST, Disp: 90 tablet, Rfl: 1 .  mometasone-formoterol (DULERA) 100-5 MCG/ACT AERO, Inhale 2 puffs into  the lungs 2 (two) times daily., Disp: 3 Inhaler, Rfl: 1 .  nicotine (NICOTROL) 10 MG inhaler, Inhale 1 continuous puffing into the lungs as needed for smoking cessation., Disp: , Rfl:  .  omeprazole (PRILOSEC) 20 MG capsule, Take 1 capsule (20 mg total) by mouth daily., Disp: 90 capsule, Rfl: 1    Review of Systems  Per HPI unless specifically indicated above     Objective:    There were no vitals taken for this visit.  Wt Readings from Last 3 Encounters:  07/31/18 158 lb 8 oz (71.9 kg)  04/25/18 157 lb 8 oz (71.4 kg)  03/20/18 156 lb (70.8 kg)    Physical Exam Pulmonary:     Effort: Pulmonary effort is normal. No respiratory distress.  Neurological:     Mental Status: He is alert and oriented to person, place, and time.  Psychiatric:        Attention and Perception: Attention normal.        Speech: Speech normal.        Behavior: Behavior is cooperative.     Results for orders placed or performed during the hospital encounter of 10/25/18  Hemoglobin A1c  Result Value Ref Range   Hgb A1c MFr Bld 7.0 (H) 4.8 - 5.6 %   Mean Plasma Glucose 154.2 mg/dL  Lipid panel  Result Value Ref Range   Cholesterol 143 0 - 200  mg/dL   Triglycerides 409206 (H) <150 mg/dL   HDL 32 (L) >81>40 mg/dL   Total CHOL/HDL Ratio 4.5 RATIO   VLDL 41 (H) 0 - 40 mg/dL   LDL Cholesterol 70 0 - 99 mg/dL  Comprehensive metabolic panel  Result Value Ref Range   Sodium 136 135 - 145 mmol/L   Potassium 4.0 3.5 - 5.1 mmol/L   Chloride 102 98 - 111 mmol/L   CO2 25 22 - 32 mmol/L   Glucose, Bld 94 70 - 99 mg/dL   BUN 11 6 - 20 mg/dL   Creatinine, Ser 1.910.68 0.61 - 1.24 mg/dL   Calcium 9.5 8.9 - 47.810.3 mg/dL   Total Protein 8.0 6.5 - 8.1 g/dL   Albumin 4.3 3.5 - 5.0 g/dL   AST 21 15 - 41 U/L   ALT 28 0 - 44 U/L   Alkaline Phosphatase 112 38 - 126 U/L   Total Bilirubin 0.3 0.3 - 1.2 mg/dL   GFR calc non Af Amer >60 >60 mL/min   GFR calc Af Amer >60 >60 mL/min   Anion gap 9 5 - 15      Assessment & Plan:    Encounter Diagnoses  Name Primary?  . Controlled type 2 diabetes mellitus without complication, without long-term current use of insulin (HCC) Yes  . Hyperlipidemia, unspecified hyperlipidemia type   . Essential hypertension   . Chronic obstructive pulmonary disease, unspecified COPD type (HCC)   . Tobacco use disorder      -Reviewed labs with pt -pt to continue current medications -encouraged pt to wear mask in public to reduce risk of contracting covid-19 -Pt to follow up in 3 months.  He is to contact office sooner prn

## 2018-12-04 ENCOUNTER — Other Ambulatory Visit: Payer: Self-pay | Admitting: Physician Assistant

## 2018-12-07 ENCOUNTER — Other Ambulatory Visit: Payer: Self-pay | Admitting: Physician Assistant

## 2019-01-31 ENCOUNTER — Other Ambulatory Visit (HOSPITAL_COMMUNITY)
Admission: RE | Admit: 2019-01-31 | Discharge: 2019-01-31 | Disposition: A | Discharge status: Home / Self Care | Payer: Self-pay | Source: Ambulatory Visit | Attending: Physician Assistant | Admitting: Physician Assistant

## 2019-01-31 DIAGNOSIS — E785 Hyperlipidemia, unspecified: Secondary | ICD-10-CM | POA: Insufficient documentation

## 2019-01-31 DIAGNOSIS — E119 Type 2 diabetes mellitus without complications: Secondary | ICD-10-CM | POA: Insufficient documentation

## 2019-01-31 DIAGNOSIS — I10 Essential (primary) hypertension: Secondary | ICD-10-CM | POA: Insufficient documentation

## 2019-01-31 LAB — COMPREHENSIVE METABOLIC PANEL
ALT: 26 U/L (ref 0–44)
AST: 27 U/L (ref 15–41)
Albumin: 4.2 g/dL (ref 3.5–5.0)
Alkaline Phosphatase: 100 U/L (ref 38–126)
Anion gap: 9 (ref 5–15)
BUN: 8 mg/dL (ref 6–20)
CO2: 23 mmol/L (ref 22–32)
Calcium: 9.2 mg/dL (ref 8.9–10.3)
Chloride: 104 mmol/L (ref 98–111)
Creatinine, Ser: 0.73 mg/dL (ref 0.61–1.24)
GFR calc Af Amer: >60 mL/min (ref >60)
GFR calc non Af Amer: >60 mL/min (ref >60)
Glucose, Bld: 168 mg/dL — ABNORMAL HIGH (ref 70–99)
Potassium: 3.3 mmol/L — ABNORMAL LOW (ref 3.5–5.1)
Sodium: 136 mmol/L (ref 135–145)
Total Bilirubin: 0.7 mg/dL (ref 0.3–1.2)
Total Protein: 8 g/dL (ref 6.5–8.1)

## 2019-01-31 LAB — LIPID PANEL
Cholesterol: 137 mg/dL (ref 0–200)
HDL: 34 mg/dL — ABNORMAL LOW (ref >40)
LDL Cholesterol: 67 mg/dL (ref 0–99)
Total CHOL/HDL Ratio: 4 RATIO
Triglycerides: 180 mg/dL — ABNORMAL HIGH (ref <150)
VLDL: 36 mg/dL (ref 0–40)

## 2019-01-31 LAB — HEMOGLOBIN A1C
Hgb A1c MFr Bld: 6.8 % — ABNORMAL HIGH (ref 4.8–5.6)
Mean Plasma Glucose: 148.46 mg/dL

## 2019-02-05 ENCOUNTER — Ambulatory Visit: Payer: Self-pay | Admitting: Physician Assistant

## 2019-02-08 ENCOUNTER — Ambulatory Visit: Payer: Self-pay | Admitting: Physician Assistant

## 2019-02-08 ENCOUNTER — Encounter: Payer: Self-pay | Admitting: Physician Assistant

## 2019-02-08 VITALS — BP 110/66 | HR 76 | Temp 99.3°F | Wt 153.2 lb

## 2019-02-08 DIAGNOSIS — I10 Essential (primary) hypertension: Secondary | ICD-10-CM

## 2019-02-08 DIAGNOSIS — J449 Chronic obstructive pulmonary disease, unspecified: Secondary | ICD-10-CM

## 2019-02-08 DIAGNOSIS — F17200 Nicotine dependence, unspecified, uncomplicated: Secondary | ICD-10-CM

## 2019-02-08 DIAGNOSIS — Z125 Encounter for screening for malignant neoplasm of prostate: Secondary | ICD-10-CM

## 2019-02-08 DIAGNOSIS — E119 Type 2 diabetes mellitus without complications: Secondary | ICD-10-CM

## 2019-02-08 DIAGNOSIS — E785 Hyperlipidemia, unspecified: Secondary | ICD-10-CM

## 2019-02-08 NOTE — Progress Notes (Signed)
BP 110/66   Pulse 76   Temp 99.3 F (37.4 C)   Wt 153 lb 3.2 oz (69.5 kg)   SpO2 95%   BMI 27.14 kg/m    Subjective:    Patient ID: Larry Floyd, male    DOB: March 01, 1961, 58 y.o.   MRN: [id number]  HPI: Larry Floyd is a 58 y.o. male presenting on 02/08/2019 for Diabetes, COPD, and Hyperlipidemia   HPI  Pt had a negative covid 19 screening questionnaire  Pt says he is doing well.  He has cut back on his smoking.  He says he is feeling well and he has no complaints.  He says he is wearing a mask on the rare occasion that he goes out in public.    Relevant past medical, surgical, family and social history reviewed and updated as indicated. Interim medical history since our last visit reviewed. Allergies and medications reviewed and updated.   Current Outpatient Medications:  .  CADUET 5-10 MG tablet, TAKE 1 Tablet BY MOUTH ONCE DAILY (Patient taking differently: Take 1 tablet by mouth daily as needed. ), Disp: 90 tablet, Rfl: 1 .  cetirizine (ZYRTEC) 10 MG tablet, Take 0.5 tablets (5 mg total) by mouth daily., Disp: 90 tablet, Rfl: 1 .  DULERA 100-5 MCG/ACT AERO, INHALE 2 PUFFS BY MOUTH TWICE DAILY. RINSE MOUTH AFTER EACH USE, Disp: 39 g, Rfl: 1 .  gabapentin (NEURONTIN) 600 MG tablet, 1 po qd at 5pm, Disp: 30 tablet, Rfl: 4 .  metFORMIN (GLUCOPHAGE-XR) 500 MG 24 hr tablet, TAKE 1 Tablet BY MOUTH ONCE DAILY WITH BREAKFAST, Disp: 90 tablet, Rfl: 1 .  nicotine (NICOTROL) 10 MG inhaler, Inhale 1 continuous puffing into the lungs as needed for smoking cessation., Disp: , Rfl:  .  omeprazole (PRILOSEC) 20 MG capsule, TAKE 1 Capsule BY MOUTH ONCE DAILY, Disp: 90 capsule, Rfl: 1 .  PROVENTIL HFA 108 (90 Base) MCG/ACT inhaler, INHALE 2 PUFFS BY MOUTH EVERY 6 HOURS AS NEEDED FOR COUGHING, WHEEZING, OR SHORTNESS OF BREATH, Disp: 20.1 g, Rfl: 1    Review of Systems  Per HPI unless specifically indicated above     Objective:    BP 110/66   Pulse 76   Temp 99.3 F (37.4 C)    Wt 153 lb 3.2 oz (69.5 kg)   SpO2 95%   BMI 27.14 kg/m   Wt Readings from Last 3 Encounters:  02/08/19 153 lb 3.2 oz (69.5 kg)  07/31/18 158 lb 8 oz (71.9 kg)  04/25/18 157 lb 8 oz (71.4 kg)    Physical Exam Vitals signs reviewed.  Constitutional:      General: He is not in acute distress.    Appearance: Normal appearance. He is well-developed. He is not ill-appearing.  HENT:     Head: Normocephalic and atraumatic.  Neck:     Musculoskeletal: Neck supple.  Cardiovascular:     Rate and Rhythm: Normal rate and regular rhythm.  Pulmonary:     Effort: Pulmonary effort is normal.     Breath sounds: Normal breath sounds. No wheezing.  Abdominal:     General: Bowel sounds are normal.     Palpations: Abdomen is soft.     Tenderness: There is no abdominal tenderness.  Musculoskeletal:     Right lower leg: No edema.     Left lower leg: No edema.  Lymphadenopathy:     Cervical: No cervical adenopathy.  Skin:    General: Skin is warm and dry.  Neurological:     Mental Status: He is alert and oriented to person, place, and time.  Psychiatric:        Attention and Perception: Attention normal.        Speech: Speech normal.        Behavior: Behavior normal. Behavior is cooperative.     Results for orders placed or performed during the hospital encounter of 01/31/19  Comprehensive metabolic panel  Result Value Ref Range   Sodium 136 135 - 145 mmol/L   Potassium 3.3 (L) 3.5 - 5.1 mmol/L   Chloride 104 98 - 111 mmol/L   CO2 23 22 - 32 mmol/L   Glucose, Bld 168 (H) 70 - 99 mg/dL   BUN 8 6 - 20 mg/dL   Creatinine, Ser 4.74 0.61 - 1.24 mg/dL   Calcium 9.2 8.9 - 25.9 mg/dL   Total Protein 8.0 6.5 - 8.1 g/dL   Albumin 4.2 3.5 - 5.0 g/dL   AST 27 15 - 41 U/L   ALT 26 0 - 44 U/L   Alkaline Phosphatase 100 38 - 126 U/L   Total Bilirubin 0.7 0.3 - 1.2 mg/dL   GFR calc non Af Amer >60 >60 mL/min   GFR calc Af Amer >60 >60 mL/min   Anion gap 9 5 - 15  Lipid panel  Result Value  Ref Range   Cholesterol 137 0 - 200 mg/dL   Triglycerides 563 (H) <150 mg/dL   HDL 34 (L) >87 mg/dL   Total CHOL/HDL Ratio 4.0 RATIO   VLDL 36 0 - 40 mg/dL   LDL Cholesterol 67 0 - 99 mg/dL  Hemoglobin F6E  Result Value Ref Range   Hgb A1c MFr Bld 6.8 (H) 4.8 - 5.6 %   Mean Plasma Glucose 148.46 mg/dL      Assessment & Plan:   Encounter Diagnoses  Name Primary?  . Controlled type 2 diabetes mellitus without complication, without long-term current use of insulin (HCC) Yes  . Essential hypertension   . Hyperlipidemia, unspecified hyperlipidemia type   . Chronic obstructive pulmonary disease, unspecified COPD type (HCC)   . Tobacco use disorder       -reviewed labs with pt -pt to continue current medications -pt congratulated on efforts to cut back on smoking and encouraged him to continue to goal of quitting smoking -pt encouraged to continue to wear a mask when in public -pt to follow up 3 months.   He is to contact office sooner prn

## 2019-02-12 ENCOUNTER — Ambulatory Visit: Payer: Self-pay | Admitting: Physician Assistant

## 2019-04-16 ENCOUNTER — Other Ambulatory Visit: Payer: Self-pay | Admitting: Physician Assistant

## 2019-05-30 ENCOUNTER — Other Ambulatory Visit (HOSPITAL_COMMUNITY)
Admission: RE | Admit: 2019-05-30 | Discharge: 2019-05-30 | Disposition: A | Discharge status: Home / Self Care | Payer: Self-pay | Source: Ambulatory Visit | Attending: Physician Assistant | Admitting: Physician Assistant

## 2019-05-30 ENCOUNTER — Other Ambulatory Visit: Payer: Self-pay

## 2019-05-30 DIAGNOSIS — Z125 Encounter for screening for malignant neoplasm of prostate: Secondary | ICD-10-CM

## 2019-05-30 DIAGNOSIS — E785 Hyperlipidemia, unspecified: Secondary | ICD-10-CM

## 2019-05-30 DIAGNOSIS — I10 Essential (primary) hypertension: Secondary | ICD-10-CM

## 2019-05-30 DIAGNOSIS — E119 Type 2 diabetes mellitus without complications: Secondary | ICD-10-CM

## 2019-05-30 LAB — COMPREHENSIVE METABOLIC PANEL
ALT: 25 U/L (ref 0–44)
AST: 20 U/L (ref 15–41)
Albumin: 4.5 g/dL (ref 3.5–5.0)
Alkaline Phosphatase: 116 U/L (ref 38–126)
Anion gap: 8 (ref 5–15)
BUN: 7 mg/dL (ref 6–20)
CO2: 27 mmol/L (ref 22–32)
Calcium: 9.4 mg/dL (ref 8.9–10.3)
Chloride: 101 mmol/L (ref 98–111)
Creatinine, Ser: 0.67 mg/dL (ref 0.61–1.24)
GFR calc Af Amer: >60 mL/min (ref >60)
GFR calc non Af Amer: >60 mL/min (ref >60)
Glucose, Bld: 81 mg/dL (ref 70–99)
Potassium: 4 mmol/L (ref 3.5–5.1)
Sodium: 136 mmol/L (ref 135–145)
Total Bilirubin: 0.4 mg/dL (ref 0.3–1.2)
Total Protein: 8.4 g/dL — ABNORMAL HIGH (ref 6.5–8.1)

## 2019-05-30 LAB — LIPID PANEL
Cholesterol: 161 mg/dL (ref 0–200)
HDL: 38 mg/dL — ABNORMAL LOW (ref >40)
LDL Cholesterol: 91 mg/dL (ref 0–99)
Total CHOL/HDL Ratio: 4.2 RATIO
Triglycerides: 158 mg/dL — ABNORMAL HIGH (ref <150)
VLDL: 32 mg/dL (ref 0–40)

## 2019-05-30 LAB — PSA: Prostatic Specific Antigen: 0.42 ng/mL (ref 0.00–4.00)

## 2019-05-30 LAB — HEMOGLOBIN A1C
Hgb A1c MFr Bld: 6.7 % — ABNORMAL HIGH (ref 4.8–5.6)
Mean Plasma Glucose: 145.59 mg/dL

## 2019-06-05 ENCOUNTER — Encounter: Payer: Self-pay | Admitting: Physician Assistant

## 2019-06-05 ENCOUNTER — Ambulatory Visit: Payer: Self-pay | Admitting: Physician Assistant

## 2019-06-05 DIAGNOSIS — I10 Essential (primary) hypertension: Secondary | ICD-10-CM

## 2019-06-05 DIAGNOSIS — E785 Hyperlipidemia, unspecified: Secondary | ICD-10-CM

## 2019-06-05 DIAGNOSIS — J449 Chronic obstructive pulmonary disease, unspecified: Secondary | ICD-10-CM

## 2019-06-05 DIAGNOSIS — E119 Type 2 diabetes mellitus without complications: Secondary | ICD-10-CM

## 2019-06-05 DIAGNOSIS — F17200 Nicotine dependence, unspecified, uncomplicated: Secondary | ICD-10-CM

## 2019-06-05 NOTE — Progress Notes (Signed)
There were no vitals taken for this visit.   Subjective:    Patient ID: Larry Floyd, male    DOB: 1961/01/09, 59 y.o.   MRN: [id number]  HPI: Larry Floyd is a 59 y.o. male presenting on 06/05/2019 for No chief complaint on file.   HPI  This is a telemedicine appointment due to coronavirus pandemic.  It is via Telephone as pt does not have a video enabled device.  Pt was scheduled to come into the office for this visit, but his wife called yesterday and changed both of their appointments to virtual format.    I connected with  [place] on 06/05/19 by a video enabled telemedicine application and verified that I am speaking with the correct person using two identifiers.   I discussed the limitations of evaluation and management by telemedicine. The patient expressed understanding and agreed to proceed.  Pt is at work.  Provider is at office.     Pt says he Feels okay.  He is Still smoking.  He checks his bp at home and he says it's been good.  He Can't remember any numbers of his readings.  He has no complaints today.      Relevant past medical, surgical, family and social history reviewed and updated as indicated. Interim medical history since our last visit reviewed. Allergies and medications reviewed and updated.   Current Outpatient Medications:  .  CADUET 5-10 MG tablet, TAKE 1 Tablet BY MOUTH ONCE DAILY, Disp: 90 tablet, Rfl: 1 .  cetirizine (ZYRTEC) 10 MG tablet, TAKE 1/2 Tablet BY MOUTH ONCE DAILY, Disp: 90 tablet, Rfl: 1 .  DULERA 100-5 MCG/ACT AERO, INHALE 2 PUFFS BY MOUTH TWICE DAILY. RINSE MOUTH AFTER EACH USE, Disp: 39 g, Rfl: 1 .  gabapentin (NEURONTIN) 600 MG tablet, 1 po qd at 5pm, Disp: 30 tablet, Rfl: 4 .  metFORMIN (GLUCOPHAGE-XR) 500 MG 24 hr tablet, TAKE 1 Tablet BY MOUTH ONCE DAILY WITH BREAKFAST, Disp: 90 tablet, Rfl: 1 .  nicotine (NICOTROL) 10 MG inhaler, Inhale 1 continuous puffing into the lungs as needed for smoking cessation., Disp: , Rfl:   .  omeprazole (PRILOSEC) 20 MG capsule, TAKE 1 Capsule BY MOUTH ONCE DAILY, Disp: 90 capsule, Rfl: 1 .  PROVENTIL HFA 108 (90 Base) MCG/ACT inhaler, INHALE 2 PUFFS BY MOUTH EVERY 6 HOURS AS NEEDED FOR COUGHING, WHEEZING, OR SHORTNESS OF BREATH, Disp: 20.1 g, Rfl: 1    Review of Systems  Per HPI unless specifically indicated above     Objective:    There were no vitals taken for this visit.  Wt Readings from Last 3 Encounters:  02/08/19 153 lb 3.2 oz (69.5 kg)  07/31/18 158 lb 8 oz (71.9 kg)  04/25/18 157 lb 8 oz (71.4 kg)    Physical Exam Pulmonary:     Effort: Pulmonary effort is normal. No respiratory distress.  Neurological:     Mental Status: He is alert and oriented to person, place, and time.  Psychiatric:        Attention and Perception: Attention normal.        Speech: Speech normal.        Behavior: Behavior is cooperative.     Comments: Pt is his usual pleasant self today.       Results for orders placed or performed during the hospital encounter of 05/30/19  PSA  Result Value Ref Range   Prostatic Specific Antigen 0.42 0.00 - 4.00 ng/mL  Lipid panel  Result Value Ref Range   Cholesterol 161 0 - 200 mg/dL   Triglycerides 817 (H) <150 mg/dL   HDL 38 (L) >71 mg/dL   Total CHOL/HDL Ratio 4.2 RATIO   VLDL 32 0 - 40 mg/dL   LDL Cholesterol 91 0 - 99 mg/dL  Comprehensive metabolic panel  Result Value Ref Range   Sodium 136 135 - 145 mmol/L   Potassium 4.0 3.5 - 5.1 mmol/L   Chloride 101 98 - 111 mmol/L   CO2 27 22 - 32 mmol/L   Glucose, Bld 81 70 - 99 mg/dL   BUN 7 6 - 20 mg/dL   Creatinine, Ser 1.65 0.61 - 1.24 mg/dL   Calcium 9.4 8.9 - 79.0 mg/dL   Total Protein 8.4 (H) 6.5 - 8.1 g/dL   Albumin 4.5 3.5 - 5.0 g/dL   AST 20 15 - 41 U/L   ALT 25 0 - 44 U/L   Alkaline Phosphatase 116 38 - 126 U/L   Total Bilirubin 0.4 0.3 - 1.2 mg/dL   GFR calc non Af Amer >60 >60 mL/min   GFR calc Af Amer >60 >60 mL/min   Anion gap 8 5 - 15  Hemoglobin A1c  Result  Value Ref Range   Hgb A1c MFr Bld 6.7 (H) 4.8 - 5.6 %   Mean Plasma Glucose 145.59 mg/dL      Assessment & Plan:   Encounter Diagnoses  Name Primary?  . Controlled type 2 diabetes mellitus without complication, without long-term current use of insulin (HCC) Yes  . Essential hypertension   . Hyperlipidemia, unspecified hyperlipidemia type   . Chronic obstructive pulmonary disease, unspecified COPD type (HCC)   . Tobacco use disorder      -Reviewed labs with pt -as things look pretty well controlled, will Contine current rx -Encouraged smoking cessation -pt to F/u 3 mo.  He is to contact office sooner prn

## 2019-08-01 ENCOUNTER — Other Ambulatory Visit: Payer: Self-pay | Admitting: Physician Assistant

## 2019-08-28 ENCOUNTER — Other Ambulatory Visit: Payer: Self-pay

## 2019-08-28 ENCOUNTER — Other Ambulatory Visit (HOSPITAL_COMMUNITY)
Admission: RE | Admit: 2019-08-28 | Discharge: 2019-08-28 | Disposition: A | Discharge status: Home / Self Care | Payer: Self-pay | Source: Ambulatory Visit | Attending: Physician Assistant | Admitting: Physician Assistant

## 2019-08-28 DIAGNOSIS — E785 Hyperlipidemia, unspecified: Secondary | ICD-10-CM | POA: Insufficient documentation

## 2019-08-28 DIAGNOSIS — I10 Essential (primary) hypertension: Secondary | ICD-10-CM | POA: Insufficient documentation

## 2019-08-28 DIAGNOSIS — E119 Type 2 diabetes mellitus without complications: Secondary | ICD-10-CM | POA: Insufficient documentation

## 2019-08-28 LAB — LIPID PANEL
Cholesterol: 160 mg/dL (ref 0–200)
HDL: 32 mg/dL — ABNORMAL LOW (ref >40)
LDL Cholesterol: 88 mg/dL (ref 0–99)
Total CHOL/HDL Ratio: 5 RATIO
Triglycerides: 201 mg/dL — ABNORMAL HIGH (ref <150)
VLDL: 40 mg/dL (ref 0–40)

## 2019-08-28 LAB — COMPREHENSIVE METABOLIC PANEL
ALT: 21 U/L (ref 0–44)
AST: 17 U/L (ref 15–41)
Albumin: 4.3 g/dL (ref 3.5–5.0)
Alkaline Phosphatase: 107 U/L (ref 38–126)
Anion gap: 10 (ref 5–15)
BUN: 9 mg/dL (ref 6–20)
CO2: 27 mmol/L (ref 22–32)
Calcium: 9.4 mg/dL (ref 8.9–10.3)
Chloride: 98 mmol/L (ref 98–111)
Creatinine, Ser: 0.74 mg/dL (ref 0.61–1.24)
GFR calc Af Amer: >60 mL/min (ref >60)
GFR calc non Af Amer: >60 mL/min (ref >60)
Glucose, Bld: 95 mg/dL (ref 70–99)
Potassium: 3.6 mmol/L (ref 3.5–5.1)
Sodium: 135 mmol/L (ref 135–145)
Total Bilirubin: 0.5 mg/dL (ref 0.3–1.2)
Total Protein: 8 g/dL (ref 6.5–8.1)

## 2019-08-29 LAB — HEMOGLOBIN A1C
Hgb A1c MFr Bld: 6.8 % — ABNORMAL HIGH (ref 4.8–5.6)
Mean Plasma Glucose: 148 mg/dL

## 2019-08-29 LAB — MICROALBUMIN, URINE: Microalb, Ur: <3 ug/mL — ABNORMAL HIGH

## 2019-09-03 ENCOUNTER — Encounter: Payer: Self-pay | Admitting: Physician Assistant

## 2019-09-03 ENCOUNTER — Other Ambulatory Visit: Payer: Self-pay

## 2019-09-03 ENCOUNTER — Ambulatory Visit: Payer: Self-pay | Admitting: Physician Assistant

## 2019-09-03 VITALS — BP 136/80 | HR 89 | Temp 99.7°F

## 2019-09-03 DIAGNOSIS — E785 Hyperlipidemia, unspecified: Secondary | ICD-10-CM

## 2019-09-03 DIAGNOSIS — Z1211 Encounter for screening for malignant neoplasm of colon: Secondary | ICD-10-CM

## 2019-09-03 DIAGNOSIS — I10 Essential (primary) hypertension: Secondary | ICD-10-CM

## 2019-09-03 DIAGNOSIS — E119 Type 2 diabetes mellitus without complications: Secondary | ICD-10-CM

## 2019-09-03 DIAGNOSIS — F17200 Nicotine dependence, unspecified, uncomplicated: Secondary | ICD-10-CM

## 2019-09-03 DIAGNOSIS — J449 Chronic obstructive pulmonary disease, unspecified: Secondary | ICD-10-CM

## 2019-09-03 MED ORDER — GABAPENTIN 600 MG PO TABS: ORAL_TABLET | ORAL | 3 refills | Status: DC

## 2019-09-03 NOTE — Progress Notes (Signed)
BP 136/80   Pulse 89   Temp 99.7 F (37.6 C)   SpO2 96%    Subjective:    Patient ID: Larry Floyd, male    DOB: 12/17/1960, 59 y.o.   MRN: [id number]  HPI: Larry Floyd is a 59 y.o. male presenting on 09/03/2019 for Diabetes, Hyperlipidemia, Hypertension, and COPD   HPI    Pt had a negative covid 19 screening questionnaire.   Pt is 70yoM with dm, copd, GERD, htn who is Working as a Presenter, broadcasting.  Pt is doing well.  His only complaint is he C/o burning toes- already taking gabapentin 600 qd.  He continues to smoke.      Relevant past medical, surgical, family and social history reviewed and updated as indicated. Interim medical history since our last visit reviewed. Allergies and medications reviewed and updated.   Current Outpatient Medications:  .  CADUET 5-10 MG tablet, TAKE 1 Tablet BY MOUTH ONCE DAILY, Disp: 90 tablet, Rfl: 1 .  cetirizine (ZYRTEC) 10 MG tablet, TAKE 1/2 Tablet BY MOUTH ONCE DAILY, Disp: 90 tablet, Rfl: 1 .  DULERA 100-5 MCG/ACT AERO, INHALE 2 PUFFS BY MOUTH TWICE DAILY. RINSE MOUTH AFTER EACH USE, Disp: 39 g, Rfl: 1 .  gabapentin (NEURONTIN) 600 MG tablet, TAKE 1 TABLET BY MOUTH ONCE DAILY AT 5PM, Disp: 30 tablet, Rfl: 0 .  metFORMIN (GLUCOPHAGE-XR) 500 MG 24 hr tablet, TAKE 1 Tablet BY MOUTH ONCE DAILY WITH BREAKFAST, Disp: 90 tablet, Rfl: 1 .  nicotine (NICOTROL) 10 MG inhaler, Inhale 1 continuous puffing into the lungs as needed for smoking cessation., Disp: , Rfl:  .  omeprazole (PRILOSEC) 20 MG capsule, TAKE 1 Capsule BY MOUTH ONCE DAILY, Disp: 90 capsule, Rfl: 1 .  PROVENTIL HFA 108 (90 Base) MCG/ACT inhaler, INHALE 2 PUFFS BY MOUTH EVERY 6 HOURS AS NEEDED FOR COUGHING, WHEEZING, OR SHORTNESS OF BREATH, Disp: 20.1 g, Rfl: 1     Review of Systems  Per HPI unless specifically indicated above     Objective:    BP 136/80   Pulse 89   Temp 99.7 F (37.6 C)   SpO2 96%   Wt Readings from Last 3 Encounters:  02/08/19 153 lb 3.2 oz  (69.5 kg)  07/31/18 158 lb 8 oz (71.9 kg)  04/25/18 157 lb 8 oz (71.4 kg)    Physical Exam Vitals reviewed.  Constitutional:      General: He is not in acute distress.    Appearance: He is well-developed. He is not ill-appearing.  HENT:     Head: Normocephalic and atraumatic.  Cardiovascular:     Rate and Rhythm: Normal rate and regular rhythm.  Pulmonary:     Effort: Pulmonary effort is normal.     Breath sounds: Normal breath sounds. No wheezing.  Abdominal:     General: Bowel sounds are normal.     Palpations: Abdomen is soft.     Tenderness: There is no abdominal tenderness.  Musculoskeletal:     Cervical back: Neck supple.     Right lower leg: No edema.     Left lower leg: No edema.  Lymphadenopathy:     Cervical: No cervical adenopathy.  Skin:    General: Skin is warm and dry.  Neurological:     Mental Status: He is alert and oriented to person, place, and time.  Psychiatric:        Attention and Perception: Attention normal.        Mood and  Affect: Mood normal.        Speech: Speech normal.        Behavior: Behavior normal. Behavior is cooperative.     Results for orders placed or performed during the hospital encounter of 08/28/19  Microalbumin, urine  Result Value Ref Range   Microalb, Ur <3.0 (H) Not Estab. ug/mL  Hemoglobin A1c  Result Value Ref Range   Hgb A1c MFr Bld 6.8 (H) 4.8 - 5.6 %   Mean Plasma Glucose 148 mg/dL  Lipid panel  Result Value Ref Range   Cholesterol 160 0 - 200 mg/dL   Triglycerides 161 (H) <150 mg/dL   HDL 32 (L) >09 mg/dL   Total CHOL/HDL Ratio 5.0 RATIO   VLDL 40 0 - 40 mg/dL   LDL Cholesterol 88 0 - 99 mg/dL  Comprehensive metabolic panel  Result Value Ref Range   Sodium 135 135 - 145 mmol/L   Potassium 3.6 3.5 - 5.1 mmol/L   Chloride 98 98 - 111 mmol/L   CO2 27 22 - 32 mmol/L   Glucose, Bld 95 70 - 99 mg/dL   BUN 9 6 - 20 mg/dL   Creatinine, Ser 6.04 0.61 - 1.24 mg/dL   Calcium 9.4 8.9 - 54.0 mg/dL   Total Protein  8.0 6.5 - 8.1 g/dL   Albumin 4.3 3.5 - 5.0 g/dL   AST 17 15 - 41 U/L   ALT 21 0 - 44 U/L   Alkaline Phosphatase 107 38 - 126 U/L   Total Bilirubin 0.5 0.3 - 1.2 mg/dL   GFR calc non Af Amer >60 >60 mL/min   GFR calc Af Amer >60 >60 mL/min   Anion gap 10 5 - 15      Assessment & Plan:   Encounter Diagnoses  Name Primary?  . Controlled type 2 diabetes mellitus without complication, without long-term current use of insulin (HCC) Yes  . Essential hypertension   . Hyperlipidemia, unspecified hyperlipidemia type   . Chronic obstructive pulmonary disease, unspecified COPD type (HCC)   . Tobacco use disorder   . Screening for colon cancer      -Reviewed labs with pt -Pt was given ifobt -will Add 1/2 tab gabapentin in morning and continue 1 whole pill in evening for neuropathy -DM foot exam was updated -encouraged smoking cessation - pt to Follow up 3 months.  He is to contact office sooner prn

## 2019-09-07 ENCOUNTER — Other Ambulatory Visit: Payer: Self-pay | Admitting: Physician Assistant

## 2019-09-07 DIAGNOSIS — Z1211 Encounter for screening for malignant neoplasm of colon: Secondary | ICD-10-CM

## 2019-10-11 ENCOUNTER — Other Ambulatory Visit: Payer: Self-pay | Admitting: Physician Assistant

## 2019-10-11 MED ORDER — METFORMIN HCL ER 500 MG PO TB24: ORAL_TABLET | ORAL | 1 refills | Status: DC

## 2019-10-11 MED ORDER — FLUTICASONE-SALMETEROL 100-50 MCG/DOSE IN AEPB: 1.0000 | INHALATION_SPRAY | Freq: Two times a day (BID) | RESPIRATORY_TRACT | 1 refills | Status: DC

## 2019-10-11 MED ORDER — AMLODIPINE-ATORVASTATIN 5-10 MG PO TABS: 1.0000 | ORAL_TABLET | Freq: Every day | ORAL | 1 refills | Status: DC

## 2019-10-11 MED ORDER — ALBUTEROL SULFATE HFA 108 (90 BASE) MCG/ACT IN AERS: INHALATION_SPRAY | RESPIRATORY_TRACT | 1 refills | Status: DC

## 2019-10-11 MED ORDER — OMEPRAZOLE 20 MG PO CPDR: 20.0000 mg | DELAYED_RELEASE_CAPSULE | Freq: Every day | ORAL | 1 refills | Status: DC

## 2019-11-29 ENCOUNTER — Other Ambulatory Visit (HOSPITAL_COMMUNITY)
Admission: RE | Admit: 2019-11-29 | Discharge: 2019-11-29 | Disposition: A | Discharge status: Home / Self Care | Payer: Self-pay | Source: Ambulatory Visit | Attending: Physician Assistant | Admitting: Physician Assistant

## 2019-11-29 DIAGNOSIS — E785 Hyperlipidemia, unspecified: Secondary | ICD-10-CM | POA: Insufficient documentation

## 2019-11-29 DIAGNOSIS — E119 Type 2 diabetes mellitus without complications: Secondary | ICD-10-CM | POA: Insufficient documentation

## 2019-11-29 DIAGNOSIS — I10 Essential (primary) hypertension: Secondary | ICD-10-CM | POA: Insufficient documentation

## 2019-11-29 LAB — COMPREHENSIVE METABOLIC PANEL
ALT: 27 U/L (ref 0–44)
AST: 19 U/L (ref 15–41)
Albumin: 4.4 g/dL (ref 3.5–5.0)
Alkaline Phosphatase: 108 U/L (ref 38–126)
Anion gap: 9 (ref 5–15)
BUN: 11 mg/dL (ref 6–20)
CO2: 25 mmol/L (ref 22–32)
Calcium: 9.1 mg/dL (ref 8.9–10.3)
Chloride: 99 mmol/L (ref 98–111)
Creatinine, Ser: 0.73 mg/dL (ref 0.61–1.24)
GFR calc Af Amer: >60 mL/min (ref >60)
GFR calc non Af Amer: >60 mL/min (ref >60)
Glucose, Bld: 111 mg/dL — ABNORMAL HIGH (ref 70–99)
Potassium: 3.6 mmol/L (ref 3.5–5.1)
Sodium: 133 mmol/L — ABNORMAL LOW (ref 135–145)
Total Bilirubin: 0.5 mg/dL (ref 0.3–1.2)
Total Protein: 8.1 g/dL (ref 6.5–8.1)

## 2019-11-29 LAB — LIPID PANEL
Cholesterol: 161 mg/dL (ref 0–200)
HDL: 28 mg/dL — ABNORMAL LOW (ref >40)
LDL Cholesterol: 83 mg/dL (ref 0–99)
Total CHOL/HDL Ratio: 5.8 RATIO
Triglycerides: 248 mg/dL — ABNORMAL HIGH (ref <150)
VLDL: 50 mg/dL — ABNORMAL HIGH (ref 0–40)

## 2019-11-29 LAB — HEMOGLOBIN A1C
Hgb A1c MFr Bld: 7.3 % — ABNORMAL HIGH (ref 4.8–5.6)
Mean Plasma Glucose: 162.81 mg/dL

## 2019-12-03 ENCOUNTER — Ambulatory Visit: Payer: Self-pay | Admitting: Physician Assistant

## 2019-12-12 ENCOUNTER — Ambulatory Visit: Payer: Self-pay | Admitting: Physician Assistant

## 2019-12-31 ENCOUNTER — Ambulatory Visit: Payer: Self-pay | Admitting: Physician Assistant

## 2019-12-31 ENCOUNTER — Encounter: Payer: Self-pay | Admitting: Physician Assistant

## 2019-12-31 VITALS — BP 128/76 | HR 78 | Temp 97.7°F

## 2019-12-31 DIAGNOSIS — E1140 Type 2 diabetes mellitus with diabetic neuropathy, unspecified: Secondary | ICD-10-CM

## 2019-12-31 DIAGNOSIS — J449 Chronic obstructive pulmonary disease, unspecified: Secondary | ICD-10-CM

## 2019-12-31 DIAGNOSIS — F17200 Nicotine dependence, unspecified, uncomplicated: Secondary | ICD-10-CM

## 2019-12-31 DIAGNOSIS — I10 Essential (primary) hypertension: Secondary | ICD-10-CM

## 2019-12-31 DIAGNOSIS — E119 Type 2 diabetes mellitus without complications: Secondary | ICD-10-CM

## 2019-12-31 MED ORDER — GABAPENTIN 600 MG PO TABS: 600.0000 mg | ORAL_TABLET | Freq: Two times a day (BID) | ORAL | 3 refills | Status: DC

## 2019-12-31 NOTE — Progress Notes (Signed)
BP 128/76   Pulse 78   Temp 97.7 F (36.5 C)   SpO2 96%    Subjective:    Patient ID: Larry Floyd, male    DOB: 25-Sep-1960, 59 y.o.   MRN: [id number]  HPI: Larry Floyd is a 59 y.o. male presenting on 12/31/2019 for No chief complaint on file.   HPI     Pt had a negative covid 19 screening questionnaire.   Pt is 58yoM with HTN, DM and COPd.  He is doing Doing okay except for his feet burn.  He says it bothers him a lot because he is on his feet a lot at work.  He got his covid vaccination.       Relevant past medical, surgical, family and social history reviewed and updated as indicated. Interim medical history since our last visit reviewed. Allergies and medications reviewed and updated.   Current Outpatient Medications:  .  albuterol (PROVENTIL HFA) 108 (90 Base) MCG/ACT inhaler, INHALE 2 PUFFS BY MOUTH EVERY 6 HOURS AS NEEDED FOR COUGHING, WHEEZING, OR SHORTNESS OF BREATH, Disp: 20.1 g, Rfl: 1 .  amLODipine-atorvastatin (CADUET) 5-10 MG tablet, Take 1 tablet by mouth daily., Disp: 90 tablet, Rfl: 1 .  cetirizine (ZYRTEC) 10 MG tablet, TAKE 1/2 Tablet BY MOUTH ONCE DAILY, Disp: 90 tablet, Rfl: 1 .  Fluticasone-Salmeterol (ADVAIR DISKUS) 100-50 MCG/DOSE AEPB, Inhale 1 puff into the lungs in the morning and at bedtime., Disp: 180 each, Rfl: 1 .  gabapentin (NEURONTIN) 600 MG tablet, Take 1/2 tablet qam and TAKE 1 TABLET BY MOUTH ONCE DAILY AT 5PM, Disp: 45 tablet, Rfl: 3 .  metFORMIN (GLUCOPHAGE-XR) 500 MG 24 hr tablet, TAKE 1 Tablet BY MOUTH ONCE DAILY WITH BREAKFAST, Disp: 90 tablet, Rfl: 1 .  omeprazole (PRILOSEC) 20 MG capsule, Take 1 capsule (20 mg total) by mouth daily., Disp: 90 capsule, Rfl: 1 .  nicotine (NICOTROL) 10 MG inhaler, Inhale 1 continuous puffing into the lungs as needed for smoking cessation., Disp: , Rfl:      Review of Systems  Per HPI unless specifically indicated above     Objective:    BP 128/76   Pulse 78   Temp 97.7 F (36.5 C)    SpO2 96%   Wt Readings from Last 3 Encounters:  02/08/19 153 lb 3.2 oz (69.5 kg)  07/31/18 158 lb 8 oz (71.9 kg)  04/25/18 157 lb 8 oz (71.4 kg)    Physical Exam Vitals reviewed.  Constitutional:      General: He is not in acute distress.    Appearance: He is well-developed. He is not ill-appearing.  HENT:     Head: Normocephalic and atraumatic.  Cardiovascular:     Rate and Rhythm: Normal rate and regular rhythm.  Pulmonary:     Effort: Pulmonary effort is normal.     Breath sounds: Normal breath sounds. No wheezing.  Abdominal:     General: Bowel sounds are normal.     Palpations: Abdomen is soft.     Tenderness: There is no abdominal tenderness.  Musculoskeletal:     Cervical back: Neck supple.     Right lower leg: No edema.     Left lower leg: No edema.  Lymphadenopathy:     Cervical: No cervical adenopathy.  Skin:    General: Skin is warm and dry.  Neurological:     Mental Status: He is alert and oriented to person, place, and time.  Psychiatric:  Behavior: Behavior normal.     Results for orders placed or performed during the hospital encounter of 11/29/19  Hemoglobin A1c  Result Value Ref Range   Hgb A1c MFr Bld 7.3 (H) 4.8 - 5.6 %   Mean Plasma Glucose 162.81 mg/dL  Comprehensive metabolic panel  Result Value Ref Range   Sodium 133 (L) 135 - 145 mmol/L   Potassium 3.6 3.5 - 5.1 mmol/L   Chloride 99 98 - 111 mmol/L   CO2 25 22 - 32 mmol/L   Glucose, Bld 111 (H) 70 - 99 mg/dL   BUN 11 6 - 20 mg/dL   Creatinine, Ser 2.68 0.61 - 1.24 mg/dL   Calcium 9.1 8.9 - 34.1 mg/dL   Total Protein 8.1 6.5 - 8.1 g/dL   Albumin 4.4 3.5 - 5.0 g/dL   AST 19 15 - 41 U/L   ALT 27 0 - 44 U/L   Alkaline Phosphatase 108 38 - 126 U/L   Total Bilirubin 0.5 0.3 - 1.2 mg/dL   GFR calc non Af Amer >60 >60 mL/min   GFR calc Af Amer >60 >60 mL/min   Anion gap 9 5 - 15  Lipid panel  Result Value Ref Range   Cholesterol 161 0 - 200 mg/dL   Triglycerides 962 (H) <150  mg/dL   HDL 28 (L) >22 mg/dL   Total CHOL/HDL Ratio 5.8 RATIO   VLDL 50 (H) 0 - 40 mg/dL   LDL Cholesterol 83 0 - 99 mg/dL      Assessment & Plan:   Encounter Diagnoses  Name Primary?  . Controlled type 2 diabetes mellitus without complication, without long-term current use of insulin (HCC) Yes  . Essential hypertension   . Chronic obstructive pulmonary disease, unspecified COPD type (HCC)   . Tobacco use disorder   . Type 2 diabetes mellitus with diabetic neuropathy, without long-term current use of insulin (HCC)       -reviewed labs wth pt.  Counseled to watch diet as his a1c is up some -pt to continue current medications -will Increase gabapentin to 1 tab bid for neuropathy -encouraged smoking cessation -pt to follow up 3 months.  He is to contact office sooner prn

## 2019-12-31 NOTE — Patient Instructions (Signed)
WYSHU'O appt- Nov 15 at 2:15pm

## 2020-01-10 ENCOUNTER — Other Ambulatory Visit: Payer: Self-pay | Admitting: Physician Assistant

## 2020-01-23 ENCOUNTER — Other Ambulatory Visit: Payer: Self-pay | Admitting: Physician Assistant

## 2020-01-23 MED ORDER — METFORMIN HCL ER 500 MG PO TB24: 500.0000 mg | ORAL_TABLET | Freq: Every day | ORAL | 1 refills | Status: DC

## 2020-01-23 MED ORDER — AMLODIPINE BESYLATE 5 MG PO TABS: 5.0000 mg | ORAL_TABLET | Freq: Every day | ORAL | 1 refills | Status: DC

## 2020-03-19 ENCOUNTER — Other Ambulatory Visit: Payer: Self-pay | Admitting: Physician Assistant

## 2020-03-19 DIAGNOSIS — I10 Essential (primary) hypertension: Secondary | ICD-10-CM

## 2020-03-19 DIAGNOSIS — E119 Type 2 diabetes mellitus without complications: Secondary | ICD-10-CM

## 2020-03-19 DIAGNOSIS — E785 Hyperlipidemia, unspecified: Secondary | ICD-10-CM

## 2020-03-24 ENCOUNTER — Other Ambulatory Visit: Payer: Self-pay

## 2020-03-24 ENCOUNTER — Other Ambulatory Visit (HOSPITAL_COMMUNITY)
Admission: RE | Admit: 2020-03-24 | Discharge: 2020-03-24 | Disposition: A | Discharge status: Home / Self Care | Payer: Self-pay | Source: Ambulatory Visit | Attending: Physician Assistant | Admitting: Physician Assistant

## 2020-03-24 DIAGNOSIS — E785 Hyperlipidemia, unspecified: Secondary | ICD-10-CM | POA: Insufficient documentation

## 2020-03-24 DIAGNOSIS — I10 Essential (primary) hypertension: Secondary | ICD-10-CM | POA: Insufficient documentation

## 2020-03-24 DIAGNOSIS — E119 Type 2 diabetes mellitus without complications: Secondary | ICD-10-CM | POA: Insufficient documentation

## 2020-03-24 LAB — COMPREHENSIVE METABOLIC PANEL WITH GFR
ALT: 24 U/L (ref 0–44)
AST: 18 U/L (ref 15–41)
Albumin: 4.2 g/dL (ref 3.5–5.0)
Alkaline Phosphatase: 98 U/L (ref 38–126)
Anion gap: 10 (ref 5–15)
BUN: 11 mg/dL (ref 6–20)
CO2: 25 mmol/L (ref 22–32)
Calcium: 9.4 mg/dL (ref 8.9–10.3)
Chloride: 101 mmol/L (ref 98–111)
Creatinine, Ser: 0.81 mg/dL (ref 0.61–1.24)
GFR, Estimated: >60 mL/min
Glucose, Bld: 91 mg/dL (ref 70–99)
Potassium: 3.5 mmol/L (ref 3.5–5.1)
Sodium: 136 mmol/L (ref 135–145)
Total Bilirubin: 0.4 mg/dL (ref 0.3–1.2)
Total Protein: 8 g/dL (ref 6.5–8.1)

## 2020-03-24 LAB — LIPID PANEL
Cholesterol: 171 mg/dL (ref 0–200)
HDL: 36 mg/dL — ABNORMAL LOW (ref >40)
LDL Cholesterol: 75 mg/dL (ref 0–99)
Total CHOL/HDL Ratio: 4.8 RATIO
Triglycerides: 300 mg/dL — ABNORMAL HIGH (ref <150)
VLDL: 60 mg/dL — ABNORMAL HIGH (ref 0–40)

## 2020-03-24 LAB — HEMOGLOBIN A1C
Hgb A1c MFr Bld: 7.1 % — ABNORMAL HIGH (ref 4.8–5.6)
Mean Plasma Glucose: 157.07 mg/dL

## 2020-03-31 ENCOUNTER — Encounter: Payer: Self-pay | Admitting: Physician Assistant

## 2020-03-31 ENCOUNTER — Ambulatory Visit: Payer: Self-pay | Admitting: Physician Assistant

## 2020-03-31 VITALS — BP 130/76 | HR 98 | Temp 96.3°F | Ht 67.0 in | Wt 159.0 lb

## 2020-03-31 DIAGNOSIS — E119 Type 2 diabetes mellitus without complications: Secondary | ICD-10-CM

## 2020-03-31 DIAGNOSIS — F17200 Nicotine dependence, unspecified, uncomplicated: Secondary | ICD-10-CM

## 2020-03-31 DIAGNOSIS — J449 Chronic obstructive pulmonary disease, unspecified: Secondary | ICD-10-CM

## 2020-03-31 DIAGNOSIS — I10 Essential (primary) hypertension: Secondary | ICD-10-CM

## 2020-03-31 DIAGNOSIS — E785 Hyperlipidemia, unspecified: Secondary | ICD-10-CM

## 2020-03-31 MED ORDER — FISH OIL 1000 MG PO CPDR: 1.0000 | DELAYED_RELEASE_CAPSULE | Freq: Two times a day (BID) | ORAL | Status: DC

## 2020-03-31 NOTE — Progress Notes (Signed)
BP 130/76   Pulse 98   Temp (!) 96.3 F (35.7 C)   Ht 5\' 7"  (1.702 m)   Wt 159 lb (72.1 kg)   SpO2 94%   BMI 24.90 kg/m    Subjective:    Patient ID: Larry Floyd, male    DOB: 01/04/1961, 59 y.o.   MRN: [id number]  HPI: Larry Floyd is a 59 y.o. male presenting on 03/31/2020 for Diabetes, Hyperlipidemia, and Hypertension   HPI   Pt had a negative covid 19 screening questionnaire.   Chief Complaint  Patient presents with  . Diabetes  . Hyperlipidemia  . Hypertension   Pt says he is doing well and he has no complaints.     Relevant past medical, surgical, family and social history reviewed and updated as indicated. Interim medical history since our last visit reviewed. Allergies and medications reviewed and updated.   Current Outpatient Medications:  .  albuterol (PROVENTIL HFA) 108 (90 Base) MCG/ACT inhaler, INHALE 2 PUFFS BY MOUTH EVERY 6 HOURS AS NEEDED FOR COUGHING, WHEEZING, OR SHORTNESS OF BREATH, Disp: 20.1 g, Rfl: 1 .  amLODipine-atorvastatin (CADUET) 5-10 MG tablet, Take 1 tablet by mouth daily., Disp: 90 tablet, Rfl: 1 .  cetirizine (ZYRTEC) 10 MG tablet, TAKE 1/2 Tablet BY MOUTH ONCE DAILY, Disp: 90 tablet, Rfl: 1 .  Fluticasone-Salmeterol (ADVAIR DISKUS) 100-50 MCG/DOSE AEPB, Inhale 1 puff into the lungs in the morning and at bedtime., Disp: 180 each, Rfl: 1 .  gabapentin (NEURONTIN) 600 MG tablet, Take 1 tablet (600 mg total) by mouth 2 (two) times daily., Disp: 60 tablet, Rfl: 3 .  metFORMIN (GLUCOPHAGE-XR) 500 MG 24 hr tablet, TAKE 1 Tablet BY MOUTH ONCE DAILY WITH BREAKFAST, Disp: 90 tablet, Rfl: 1 .  nicotine (NICOTROL) 10 MG inhaler, Inhale 1 continuous puffing into the lungs as needed for smoking cessation., Disp: , Rfl:  .  omeprazole (PRILOSEC) 20 MG capsule, Take 1 capsule (20 mg total) by mouth daily., Disp: 90 capsule, Rfl: 1     Review of Systems  Per HPI unless specifically indicated above     Objective:    BP 130/76    Pulse 98   Temp (!) 96.3 F (35.7 C)   Ht 5\' 7"  (1.702 m)   Wt 159 lb (72.1 kg)   SpO2 94%   BMI 24.90 kg/m   Wt Readings from Last 3 Encounters:  03/31/20 159 lb (72.1 kg)  02/08/19 153 lb 3.2 oz (69.5 kg)  07/31/18 158 lb 8 oz (71.9 kg)    Physical Exam Vitals reviewed.  Constitutional:      General: He is not in acute distress.    Appearance: He is well-developed. He is not ill-appearing.  HENT:     Head: Normocephalic and atraumatic.  Cardiovascular:     Rate and Rhythm: Normal rate and regular rhythm.  Pulmonary:     Effort: Pulmonary effort is normal.     Breath sounds: Normal breath sounds. No wheezing.  Abdominal:     General: Bowel sounds are normal.     Palpations: Abdomen is soft.     Tenderness: There is no abdominal tenderness.  Musculoskeletal:     Cervical back: Neck supple.     Right lower leg: No edema.     Left lower leg: No edema.  Lymphadenopathy:     Cervical: No cervical adenopathy.  Skin:    General: Skin is warm and dry.  Neurological:     Mental Status: He  is alert and oriented to person, place, and time.  Psychiatric:        Behavior: Behavior normal.     Results for orders placed or performed during the hospital encounter of 03/24/20  Hemoglobin A1c  Result Value Ref Range   Hgb A1c MFr Bld 7.1 (H) 4.8 - 5.6 %   Mean Plasma Glucose 157.07 mg/dL  Lipid panel  Result Value Ref Range   Cholesterol 171 0 - 200 mg/dL   Triglycerides 182 (H) <150 mg/dL   HDL 36 (L) >99 mg/dL   Total CHOL/HDL Ratio 4.8 RATIO   VLDL 60 (H) 0 - 40 mg/dL   LDL Cholesterol 75 0 - 99 mg/dL  Comprehensive metabolic panel  Result Value Ref Range   Sodium 136 135 - 145 mmol/L   Potassium 3.5 3.5 - 5.1 mmol/L   Chloride 101 98 - 111 mmol/L   CO2 25 22 - 32 mmol/L   Glucose, Bld 91 70 - 99 mg/dL   BUN 11 6 - 20 mg/dL   Creatinine, Ser 3.71 0.61 - 1.24 mg/dL   Calcium 9.4 8.9 - 69.6 mg/dL   Total Protein 8.0 6.5 - 8.1 g/dL   Albumin 4.2 3.5 - 5.0 g/dL    AST 18 15 - 41 U/L   ALT 24 0 - 44 U/L   Alkaline Phosphatase 98 38 - 126 U/L   Total Bilirubin 0.4 0.3 - 1.2 mg/dL   GFR, Estimated >78 >93 mL/min   Anion gap 10 5 - 15      Assessment & Plan:   Encounter Diagnoses  Name Primary?  . Controlled type 2 diabetes mellitus without complication, without long-term current use of insulin (HCC) Yes  . Essential hypertension   . Hyperlipidemia, unspecified hyperlipidemia type   . Chronic obstructive pulmonary disease, unspecified COPD type (HCC)   . Tobacco use disorder      -reviewed labs with pt -pt to Continue current meds -will Add fish oit -pt on list for DM eye exam -encouaraged smoking cessation -pt to follow up 3 months.  He is to contact office sooner prn

## 2020-05-08 ENCOUNTER — Other Ambulatory Visit: Payer: Self-pay | Admitting: Physician Assistant

## 2020-05-08 MED ORDER — ATORVASTATIN CALCIUM 10 MG PO TABS: 10.0000 mg | ORAL_TABLET | Freq: Every day | ORAL | 2 refills | Status: DC

## 2020-05-08 MED ORDER — METFORMIN HCL ER 500 MG PO TB24: ORAL_TABLET | ORAL | 2 refills | Status: DC

## 2020-05-08 MED ORDER — AMLODIPINE BESYLATE 5 MG PO TABS: 5.0000 mg | ORAL_TABLET | Freq: Every day | ORAL | 2 refills | Status: DC

## 2020-05-13 ENCOUNTER — Other Ambulatory Visit: Payer: Self-pay | Admitting: Physician Assistant

## 2020-06-05 ENCOUNTER — Other Ambulatory Visit: Payer: Self-pay | Admitting: Physician Assistant

## 2020-06-05 MED ORDER — ALBUTEROL SULFATE HFA 108 (90 BASE) MCG/ACT IN AERS: INHALATION_SPRAY | RESPIRATORY_TRACT | 1 refills | Status: DC

## 2020-06-05 MED ORDER — AMLODIPINE BESYLATE 5 MG PO TABS: 5.0000 mg | ORAL_TABLET | Freq: Every day | ORAL | 1 refills | Status: DC

## 2020-06-05 MED ORDER — FLUTICASONE-SALMETEROL 100-50 MCG/DOSE IN AEPB: INHALATION_SPRAY | RESPIRATORY_TRACT | 1 refills | Status: DC

## 2020-06-05 MED ORDER — ATORVASTATIN CALCIUM 10 MG PO TABS: 10.0000 mg | ORAL_TABLET | Freq: Every day | ORAL | 1 refills | Status: DC

## 2020-07-02 ENCOUNTER — Other Ambulatory Visit: Payer: Self-pay | Admitting: Physician Assistant

## 2020-07-02 DIAGNOSIS — Z125 Encounter for screening for malignant neoplasm of prostate: Secondary | ICD-10-CM

## 2020-07-02 DIAGNOSIS — I10 Essential (primary) hypertension: Secondary | ICD-10-CM

## 2020-07-02 DIAGNOSIS — E119 Type 2 diabetes mellitus without complications: Secondary | ICD-10-CM

## 2020-07-02 DIAGNOSIS — E785 Hyperlipidemia, unspecified: Secondary | ICD-10-CM

## 2020-07-08 ENCOUNTER — Other Ambulatory Visit (HOSPITAL_COMMUNITY)
Admission: RE | Admit: 2020-07-08 | Discharge: 2020-07-08 | Disposition: A | Discharge status: Home / Self Care | Payer: Self-pay | Source: Ambulatory Visit | Attending: Physician Assistant | Admitting: Physician Assistant

## 2020-07-08 ENCOUNTER — Other Ambulatory Visit: Payer: Self-pay

## 2020-07-08 DIAGNOSIS — E119 Type 2 diabetes mellitus without complications: Secondary | ICD-10-CM

## 2020-07-08 DIAGNOSIS — I10 Essential (primary) hypertension: Secondary | ICD-10-CM

## 2020-07-08 DIAGNOSIS — E785 Hyperlipidemia, unspecified: Secondary | ICD-10-CM

## 2020-07-08 DIAGNOSIS — Z125 Encounter for screening for malignant neoplasm of prostate: Secondary | ICD-10-CM

## 2020-07-08 LAB — COMPREHENSIVE METABOLIC PANEL
ALT: 23 U/L (ref 0–44)
AST: 17 U/L (ref 15–41)
Albumin: 4.3 g/dL (ref 3.5–5.0)
Alkaline Phosphatase: 101 U/L (ref 38–126)
Anion gap: 8 (ref 5–15)
BUN: 8 mg/dL (ref 6–20)
CO2: 25 mmol/L (ref 22–32)
Calcium: 9.1 mg/dL (ref 8.9–10.3)
Chloride: 98 mmol/L (ref 98–111)
Creatinine, Ser: 0.77 mg/dL (ref 0.61–1.24)
GFR, Estimated: >60 mL/min (ref >60)
Glucose, Bld: 113 mg/dL — ABNORMAL HIGH (ref 70–99)
Potassium: 4 mmol/L (ref 3.5–5.1)
Sodium: 131 mmol/L — ABNORMAL LOW (ref 135–145)
Total Bilirubin: 0.4 mg/dL (ref 0.3–1.2)
Total Protein: 8.4 g/dL — ABNORMAL HIGH (ref 6.5–8.1)

## 2020-07-08 LAB — LIPID PANEL
Cholesterol: 156 mg/dL (ref 0–200)
HDL: 38 mg/dL — ABNORMAL LOW (ref >40)
LDL Cholesterol: 86 mg/dL (ref 0–99)
Total CHOL/HDL Ratio: 4.1 RATIO
Triglycerides: 159 mg/dL — ABNORMAL HIGH (ref <150)
VLDL: 32 mg/dL (ref 0–40)

## 2020-07-08 LAB — PSA: Prostatic Specific Antigen: 0.61 ng/mL (ref 0.00–4.00)

## 2020-07-08 LAB — HEMOGLOBIN A1C
Hgb A1c MFr Bld: 7.3 % — ABNORMAL HIGH (ref 4.8–5.6)
Mean Plasma Glucose: 162.81 mg/dL

## 2020-07-14 ENCOUNTER — Encounter: Payer: Self-pay | Admitting: Physician Assistant

## 2020-07-14 ENCOUNTER — Ambulatory Visit: Payer: Self-pay | Admitting: Physician Assistant

## 2020-07-14 VITALS — BP 138/80

## 2020-07-14 DIAGNOSIS — J449 Chronic obstructive pulmonary disease, unspecified: Secondary | ICD-10-CM

## 2020-07-14 DIAGNOSIS — I10 Essential (primary) hypertension: Secondary | ICD-10-CM

## 2020-07-14 DIAGNOSIS — E119 Type 2 diabetes mellitus without complications: Secondary | ICD-10-CM

## 2020-07-14 DIAGNOSIS — E785 Hyperlipidemia, unspecified: Secondary | ICD-10-CM

## 2020-07-14 DIAGNOSIS — F17200 Nicotine dependence, unspecified, uncomplicated: Secondary | ICD-10-CM

## 2020-07-14 MED ORDER — GABAPENTIN 600 MG PO TABS: 600.0000 mg | ORAL_TABLET | Freq: Two times a day (BID) | ORAL | 3 refills | Status: DC

## 2020-07-14 NOTE — Progress Notes (Signed)
BP 138/80    Subjective:    Patient ID: Larry Floyd, male    DOB: 11/15/1960, 60 y.o.   MRN: [id number]  HPI: Larry Floyd is a 60 y.o. male presenting on 07/14/2020 for No chief complaint on file.   HPI     This is a telemedicine appointment due to coronavirus pandemic,  It is via telephone as pt does not have a video enabled device.   I connected with  Loralyn Freshwater Din on 07/14/20 by a video enabled telemedicine application and verified that I am speaking with the correct person using two identifiers.   I discussed the limitations of evaluation and management by telemedicine. The patient expressed understanding and agreed to proceed.  Pt is at home.  Provider is at office.    Pt is 60yoM with Dm dyslipidemia  htn and copd.  He says he is Doing well.  He says he Gets "swimmy headed" at times.  When questioned further, the times he is getting "swimmy headed" is in the morning when he gets to work.  He says he does not eat breakfast.   He doesn't have any cp or sob or nausea with the episodes.   He is still smoking  He Got his covid booster  He says he drinks 1 or 2 beer maybe twice/week.        Relevant past medical, surgical, family and social history reviewed and updated as indicated. Interim medical history since our last visit reviewed. Allergies and medications reviewed and updated.   Current Outpatient Medications:  .  albuterol (PROVENTIL HFA) 108 (90 Base) MCG/ACT inhaler, INHALE 2 PUFFS BY MOUTH EVERY 6 HOURS AS NEEDED FOR COUGHING, WHEEZING, OR SHORTNESS OF BREATH, Disp: 6.7 g, Rfl: 1 .  CADUET 5-10 MG tablet, TAKE 1 Tablet BY MOUTH ONCE DAILY, Disp: 90 tablet, Rfl: 1 .  cetirizine (ZYRTEC) 10 MG tablet, TAKE 1/2 Tablet BY MOUTH ONCE DAILY, Disp: 45 tablet, Rfl: 1 .  Fluticasone-Salmeterol (ADVAIR) 100-50 MCG/DOSE AEPB, INHALE 1 PUFF BY MOUTH TWICE DAILY (EVERY 12 HOURS). RINSE MOUTH AFTER USE. (IN THE morning and at bedtime), Disp: 60 each, Rfl: 1 .   gabapentin (NEURONTIN) 600 MG tablet, Take 1 tablet (600 mg total) by mouth 2 (two) times daily., Disp: 60 tablet, Rfl: 3 .  metFORMIN (GLUCOPHAGE-XR) 500 MG 24 hr tablet, TAKE 1 Tablet BY MOUTH ONCE DAILY WITH BREAKFAST, Disp: 90 tablet, Rfl: 1 .  nicotine (NICOTROL) 10 MG inhaler, Inhale 1 continuous puffing into the lungs as needed for smoking cessation., Disp: , Rfl:  .  omeprazole (PRILOSEC) 20 MG capsule, TAKE 1 Capsule BY MOUTH ONCE DAILY, Disp: 90 capsule, Rfl: 1 .  Omega-3 Fatty Acids (FISH OIL) 1000 MG CPDR, Take 1 capsule by mouth in the morning and at bedtime. (Patient not taking: Reported on 07/14/2020), Disp: , Rfl:      Review of Systems  Per HPI unless specifically indicated above     Objective:    BP 138/80   Wt Readings from Last 3 Encounters:  03/31/20 159 lb (72.1 kg)  02/08/19 153 lb 3.2 oz (69.5 kg)  07/31/18 158 lb 8 oz (71.9 kg)    Physical Exam Pulmonary:     Effort: No respiratory distress.     Comments: Pt is talking in complete sentences without sob Neurological:     Mental Status: He is alert and oriented to person, place, and time.  Psychiatric:        Attention  and Perception: Attention normal.        Speech: Speech normal.        Behavior: Behavior is cooperative.     Results for orders placed or performed during the hospital encounter of 07/08/20  Lipid panel  Result Value Ref Range   Cholesterol 156 0 - 200 mg/dL   Triglycerides 224 (H) <150 mg/dL   HDL 38 (L) >82 mg/dL   Total CHOL/HDL Ratio 4.1 RATIO   VLDL 32 0 - 40 mg/dL   LDL Cholesterol 86 0 - 99 mg/dL  Hemoglobin N0I  Result Value Ref Range   Hgb A1c MFr Bld 7.3 (H) 4.8 - 5.6 %   Mean Plasma Glucose 162.81 mg/dL  Comprehensive metabolic panel  Result Value Ref Range   Sodium 131 (L) 135 - 145 mmol/L   Potassium 4.0 3.5 - 5.1 mmol/L   Chloride 98 98 - 111 mmol/L   CO2 25 22 - 32 mmol/L   Glucose, Bld 113 (H) 70 - 99 mg/dL   BUN 8 6 - 20 mg/dL   Creatinine, Ser 3.70 0.61 -  1.24 mg/dL   Calcium 9.1 8.9 - 48.8 mg/dL   Total Protein 8.4 (H) 6.5 - 8.1 g/dL   Albumin 4.3 3.5 - 5.0 g/dL   AST 17 15 - 41 U/L   ALT 23 0 - 44 U/L   Alkaline Phosphatase 101 38 - 126 U/L   Total Bilirubin 0.4 0.3 - 1.2 mg/dL   GFR, Estimated >89 >16 mL/min   Anion gap 8 5 - 15  PSA  Result Value Ref Range   Prostatic Specific Antigen 0.61 0.00 - 4.00 ng/mL      Assessment & Plan:    Encounter Diagnoses  Name Primary?  . Controlled type 2 diabetes mellitus without complication, without long-term current use of insulin (HCC) Yes  . Essential hypertension   . Hyperlipidemia, unspecified hyperlipidemia type   . Chronic obstructive pulmonary disease, unspecified COPD type (HCC)   . Tobacco use disorder      -reviewed labs with pt -pt is counseled to Eat before work.  Discussed that being diabetic he should not skip meals.  Small meal or a snack is fine to prevent low blood sugars and that swimmy headed feeling he has before work. He should contact office if this strategy does not resolve his symptoms -pt to continue current medications -encouraged smoking cessation -pt to follow up 3 months.  He is to contact office sooner prn

## 2020-09-29 ENCOUNTER — Other Ambulatory Visit: Payer: Self-pay | Admitting: Physician Assistant

## 2020-09-29 DIAGNOSIS — E119 Type 2 diabetes mellitus without complications: Secondary | ICD-10-CM

## 2020-09-29 DIAGNOSIS — E785 Hyperlipidemia, unspecified: Secondary | ICD-10-CM

## 2020-09-29 DIAGNOSIS — I10 Essential (primary) hypertension: Secondary | ICD-10-CM

## 2020-10-01 ENCOUNTER — Other Ambulatory Visit: Payer: Self-pay | Admitting: Physician Assistant

## 2020-10-03 ENCOUNTER — Other Ambulatory Visit (HOSPITAL_COMMUNITY)
Admission: RE | Admit: 2020-10-03 | Discharge: 2020-10-03 | Disposition: A | Discharge status: Home / Self Care | Payer: Self-pay | Source: Ambulatory Visit | Attending: Physician Assistant | Admitting: Physician Assistant

## 2020-10-03 ENCOUNTER — Other Ambulatory Visit: Payer: Self-pay

## 2020-10-03 DIAGNOSIS — I10 Essential (primary) hypertension: Secondary | ICD-10-CM

## 2020-10-03 DIAGNOSIS — E119 Type 2 diabetes mellitus without complications: Secondary | ICD-10-CM

## 2020-10-03 DIAGNOSIS — E785 Hyperlipidemia, unspecified: Secondary | ICD-10-CM

## 2020-10-03 LAB — LIPID PANEL
Cholesterol: 163 mg/dL (ref 0–200)
HDL: 30 mg/dL — ABNORMAL LOW (ref >40)
LDL Cholesterol: 56 mg/dL (ref 0–99)
Total CHOL/HDL Ratio: 5.4 RATIO
Triglycerides: 383 mg/dL — ABNORMAL HIGH (ref <150)
VLDL: 77 mg/dL — ABNORMAL HIGH (ref 0–40)

## 2020-10-03 LAB — COMPREHENSIVE METABOLIC PANEL
ALT: 24 U/L (ref 0–44)
AST: 19 U/L (ref 15–41)
Albumin: 4.4 g/dL (ref 3.5–5.0)
Alkaline Phosphatase: 110 U/L (ref 38–126)
Anion gap: 8 (ref 5–15)
BUN: 13 mg/dL (ref 6–20)
CO2: 24 mmol/L (ref 22–32)
Calcium: 9.2 mg/dL (ref 8.9–10.3)
Chloride: 101 mmol/L (ref 98–111)
Creatinine, Ser: 0.85 mg/dL (ref 0.61–1.24)
GFR, Estimated: >60 mL/min (ref >60)
Glucose, Bld: 139 mg/dL — ABNORMAL HIGH (ref 70–99)
Potassium: 3.7 mmol/L (ref 3.5–5.1)
Sodium: 133 mmol/L — ABNORMAL LOW (ref 135–145)
Total Bilirubin: 0.5 mg/dL (ref 0.3–1.2)
Total Protein: 8 g/dL (ref 6.5–8.1)

## 2020-10-03 LAB — HEMOGLOBIN A1C
Hgb A1c MFr Bld: 8 % — ABNORMAL HIGH (ref 4.8–5.6)
Mean Plasma Glucose: 182.9 mg/dL

## 2020-10-04 LAB — MICROALBUMIN, URINE: Microalb, Ur: 8.5 ug/mL — ABNORMAL HIGH

## 2020-10-09 ENCOUNTER — Other Ambulatory Visit: Payer: Self-pay

## 2020-10-09 ENCOUNTER — Ambulatory Visit: Payer: Self-pay | Admitting: Physician Assistant

## 2020-10-09 ENCOUNTER — Encounter: Payer: Self-pay | Admitting: Physician Assistant

## 2020-10-09 ENCOUNTER — Ambulatory Visit (HOSPITAL_COMMUNITY)
Admission: RE | Admit: 2020-10-09 | Discharge: 2020-10-09 | Disposition: A | Discharge status: Home / Self Care | Payer: Self-pay | Source: Ambulatory Visit | Attending: Physician Assistant | Admitting: Physician Assistant

## 2020-10-09 VITALS — BP 122/76 | HR 77 | Temp 97.8°F | Wt 155.0 lb

## 2020-10-09 DIAGNOSIS — F17200 Nicotine dependence, unspecified, uncomplicated: Secondary | ICD-10-CM

## 2020-10-09 DIAGNOSIS — E785 Hyperlipidemia, unspecified: Secondary | ICD-10-CM

## 2020-10-09 DIAGNOSIS — E119 Type 2 diabetes mellitus without complications: Secondary | ICD-10-CM

## 2020-10-09 DIAGNOSIS — R609 Edema, unspecified: Secondary | ICD-10-CM | POA: Insufficient documentation

## 2020-10-09 DIAGNOSIS — J449 Chronic obstructive pulmonary disease, unspecified: Secondary | ICD-10-CM | POA: Insufficient documentation

## 2020-10-09 DIAGNOSIS — I10 Essential (primary) hypertension: Secondary | ICD-10-CM

## 2020-10-09 MED ORDER — FISH OIL 1000 MG PO CPDR: 1.0000 | DELAYED_RELEASE_CAPSULE | Freq: Two times a day (BID) | ORAL | Status: DC

## 2020-10-09 MED ORDER — ATORVASTATIN CALCIUM 10 MG PO TABS: 10.0000 mg | ORAL_TABLET | Freq: Every day | ORAL | 3 refills | Status: DC

## 2020-10-09 MED ORDER — OMEPRAZOLE 20 MG PO CPDR
1.0000 | DELAYED_RELEASE_CAPSULE | Freq: Every day | ORAL | 1 refills | Status: DC
Start: 2020-10-09 — End: 2021-04-13

## 2020-10-09 MED ORDER — METFORMIN HCL 500 MG PO TABS: 500.0000 mg | ORAL_TABLET | Freq: Two times a day (BID) | ORAL | 3 refills | Status: DC

## 2020-10-09 MED ORDER — ALBUTEROL SULFATE HFA 108 (90 BASE) MCG/ACT IN AERS: INHALATION_SPRAY | RESPIRATORY_TRACT | 1 refills | Status: DC

## 2020-10-09 MED ORDER — HYDROCHLOROTHIAZIDE 25 MG PO TABS: 25.0000 mg | ORAL_TABLET | Freq: Every day | ORAL | 3 refills | Status: DC

## 2020-10-09 NOTE — Patient Instructions (Signed)
[employer] application

## 2020-10-09 NOTE — Progress Notes (Signed)
BP 122/76   Pulse 77   Temp 97.8 F (36.6 C)   Wt 155 lb (70.3 kg)   SpO2 95%   BMI 24.28 kg/m    Subjective:    Patient ID: Larry Floyd, male    DOB: 1961-03-29, 60 y.o.   MRN: [id number]  HPI: Larry Floyd is a 60 y.o. male presenting on 10/09/2020 for No chief complaint on file.   HPI  Pt had a negative covid 19 screening questionnaire.     Pt is 59yoM who presents for routine follow up diabetes, hypertension and dyslipidemia.  He says his Ankles swelling for coulple weeks.  He Works as Electrical engineer.  He is Still smoking.  He says he sometimes feels dizzy.  He says when he feels dizzy is when he gets to work.  He does not eat anything prior to going to work.  No LOC.  He says the dizziness goes away when he eats.    Relevant past medical, surgical, family and social history reviewed and updated as indicated. Interim medical history since our last visit reviewed. Allergies and medications reviewed and updated.   Current Outpatient Medications:  .  albuterol (PROVENTIL HFA) 108 (90 Base) MCG/ACT inhaler, INHALE 2 PUFFS BY MOUTH EVERY 6 HOURS AS NEEDED FOR COUGHING, WHEEZING, OR SHORTNESS OF BREATH, Disp: 6.7 g, Rfl: 1 .  CADUET 5-10 MG tablet, TAKE 1 Tablet BY MOUTH ONCE DAILY, Disp: 90 tablet, Rfl: 1 .  cetirizine (ZYRTEC) 10 MG tablet, TAKE 1/2 Tablet BY MOUTH ONCE DAILY, Disp: 45 tablet, Rfl: 1 .  Fluticasone-Salmeterol (ADVAIR) 100-50 MCG/DOSE AEPB, INHALE 1 PUFF BY MOUTH TWICE DAILY (EVERY 12 HOURS). RINSE MOUTH AFTER USE. (IN THE morning and at bedtime), Disp: 60 each, Rfl: 1 .  gabapentin (NEURONTIN) 600 MG tablet, Take 1 tablet (600 mg total) by mouth 2 (two) times daily., Disp: 60 tablet, Rfl: 3 .  metFORMIN (GLUCOPHAGE-XR) 500 MG 24 hr tablet, TAKE 1 Tablet BY MOUTH ONCE DAILY WITH BREAKFAST, Disp: 90 tablet, Rfl: 1 .  nicotine (NICOTROL) 10 MG inhaler, Inhale 1 continuous puffing into the lungs as needed for smoking cessation., Disp: , Rfl:  .  Omega-3  Fatty Acids (FISH OIL) 1000 MG CPDR, Take 1 capsule by mouth in the morning and at bedtime., Disp: , Rfl:  .  omeprazole (PRILOSEC) 20 MG capsule, TAKE 1 Capsule BY MOUTH ONCE DAILY, Disp: 90 capsule, Rfl: 1   Review of Systems  Per HPI unless specifically indicated above     Objective:    BP 122/76   Pulse 77   Temp 97.8 F (36.6 C)   Wt 155 lb (70.3 kg)   SpO2 95%   BMI 24.28 kg/m   Wt Readings from Last 3 Encounters:  10/09/20 155 lb (70.3 kg)  03/31/20 159 lb (72.1 kg)  02/08/19 153 lb 3.2 oz (69.5 kg)    Physical Exam Vitals reviewed.  Constitutional:      General: He is not in acute distress.    Appearance: He is well-developed. He is not toxic-appearing.  HENT:     Head: Normocephalic and atraumatic.  Cardiovascular:     Rate and Rhythm: Normal rate and regular rhythm.  Pulmonary:     Effort: Pulmonary effort is normal. No tachypnea or respiratory distress.     Breath sounds: No stridor. Wheezing present. No rhonchi.     Comments: Scattered expiratory wheezes Abdominal:     General: Bowel sounds are normal.  Palpations: Abdomen is soft.     Tenderness: There is no abdominal tenderness.  Musculoskeletal:     Cervical back: Neck supple.     Right lower leg: No edema.     Left lower leg: No edema.  Lymphadenopathy:     Cervical: No cervical adenopathy.  Skin:    General: Skin is warm and dry.  Neurological:     Mental Status: He is alert and oriented to person, place, and time.  Psychiatric:        Behavior: Behavior normal.     Results for orders placed or performed during the hospital encounter of 10/03/20  Lipid panel  Result Value Ref Range   Cholesterol 163 0 - 200 mg/dL   Triglycerides 845 (H) <150 mg/dL   HDL 30 (L) >36 mg/dL   Total CHOL/HDL Ratio 5.4 RATIO   VLDL 77 (H) 0 - 40 mg/dL   LDL Cholesterol 56 0 - 99 mg/dL  Comprehensive metabolic panel  Result Value Ref Range   Sodium 133 (L) 135 - 145 mmol/L   Potassium 3.7 3.5 - 5.1  mmol/L   Chloride 101 98 - 111 mmol/L   CO2 24 22 - 32 mmol/L   Glucose, Bld 139 (H) 70 - 99 mg/dL   BUN 13 6 - 20 mg/dL   Creatinine, Ser 4.68 0.61 - 1.24 mg/dL   Calcium 9.2 8.9 - 03.2 mg/dL   Total Protein 8.0 6.5 - 8.1 g/dL   Albumin 4.4 3.5 - 5.0 g/dL   AST 19 15 - 41 U/L   ALT 24 0 - 44 U/L   Alkaline Phosphatase 110 38 - 126 U/L   Total Bilirubin 0.5 0.3 - 1.2 mg/dL   GFR, Estimated >12 >24 mL/min   Anion gap 8 5 - 15  Hemoglobin A1c  Result Value Ref Range   Hgb A1c MFr Bld 8.0 (H) 4.8 - 5.6 %   Mean Plasma Glucose 182.9 mg/dL  Microalbumin, urine  Result Value Ref Range   Microalb, Ur 8.5 (H) Not Estab. ug/mL      Assessment & Plan:   Encounter Diagnoses  Name Primary?  . Controlled type 2 diabetes mellitus without complication, without long-term current use of insulin (HCC) Yes  . Essential hypertension   . Chronic obstructive pulmonary disease, unspecified COPD type (HCC)   . Hyperlipidemia, unspecified hyperlipidemia type   . Edema, unspecified type   . Tobacco use disorder      -reviewed labs with pt  -Increase metformin to bid -Order caduiet separatedly (not available together through [facility]) -Increase fish oil to 2 dauly -will get CXR for edema and smoking and abnormal lung sounds -he is given application for [facility] [employer] financial assistance (to cover cxr) -encouraged smoking cessation -pt was given [employer] application for advair (since it is no longer available through [facility]) -will discontinue amlodipine and rx hctz -pt is encouraged to eat before he goes to work.  Discussed importance of regular feedings, particularly in light of his diabetes -DM foot exam was updated -pt self-cancelled 2 different DM eye exams that had been scheduled for him.  He cannot be scheduled again -pt to follow up 3 months.  He is to contact office sooner prn

## 2020-10-14 ENCOUNTER — Ambulatory Visit: Payer: Self-pay | Admitting: Physician Assistant

## 2020-12-29 ENCOUNTER — Other Ambulatory Visit: Payer: Self-pay | Admitting: Physician Assistant

## 2020-12-29 DIAGNOSIS — E785 Hyperlipidemia, unspecified: Secondary | ICD-10-CM

## 2020-12-29 DIAGNOSIS — E119 Type 2 diabetes mellitus without complications: Secondary | ICD-10-CM

## 2020-12-29 DIAGNOSIS — I10 Essential (primary) hypertension: Secondary | ICD-10-CM

## 2021-01-01 ENCOUNTER — Other Ambulatory Visit (HOSPITAL_COMMUNITY)
Admission: RE | Admit: 2021-01-01 | Discharge: 2021-01-01 | Disposition: A | Discharge status: Home / Self Care | Payer: Self-pay | Source: Ambulatory Visit | Attending: Physician Assistant | Admitting: Physician Assistant

## 2021-01-01 DIAGNOSIS — E119 Type 2 diabetes mellitus without complications: Secondary | ICD-10-CM | POA: Insufficient documentation

## 2021-01-01 DIAGNOSIS — E785 Hyperlipidemia, unspecified: Secondary | ICD-10-CM | POA: Insufficient documentation

## 2021-01-01 DIAGNOSIS — I10 Essential (primary) hypertension: Secondary | ICD-10-CM | POA: Insufficient documentation

## 2021-01-01 LAB — COMPREHENSIVE METABOLIC PANEL
ALT: 20 U/L (ref 0–44)
AST: 14 U/L — ABNORMAL LOW (ref 15–41)
Albumin: 4.5 g/dL (ref 3.5–5.0)
Alkaline Phosphatase: 135 U/L — ABNORMAL HIGH (ref 38–126)
Anion gap: 11 (ref 5–15)
BUN: 13 mg/dL (ref 6–20)
CO2: 24 mmol/L (ref 22–32)
Calcium: 8.7 mg/dL — ABNORMAL LOW (ref 8.9–10.3)
Chloride: 90 mmol/L — ABNORMAL LOW (ref 98–111)
Creatinine, Ser: 0.83 mg/dL (ref 0.61–1.24)
GFR, Estimated: >60 mL/min (ref >60)
Glucose, Bld: 382 mg/dL — ABNORMAL HIGH (ref 70–99)
Potassium: 3.5 mmol/L (ref 3.5–5.1)
Sodium: 125 mmol/L — ABNORMAL LOW (ref 135–145)
Total Bilirubin: 0.7 mg/dL (ref 0.3–1.2)
Total Protein: 8.5 g/dL — ABNORMAL HIGH (ref 6.5–8.1)

## 2021-01-01 LAB — LIPID PANEL
Cholesterol: 166 mg/dL (ref 0–200)
HDL: 30 mg/dL — ABNORMAL LOW (ref >40)
LDL Cholesterol: 62 mg/dL (ref 0–99)
Total CHOL/HDL Ratio: 5.5 RATIO
Triglycerides: 368 mg/dL — ABNORMAL HIGH (ref <150)
VLDL: 74 mg/dL — ABNORMAL HIGH (ref 0–40)

## 2021-01-01 LAB — HEMOGLOBIN A1C
Hgb A1c MFr Bld: 10.8 % — ABNORMAL HIGH (ref 4.8–5.6)
Mean Plasma Glucose: 263.26 mg/dL

## 2021-01-06 ENCOUNTER — Ambulatory Visit: Payer: Self-pay | Admitting: Physician Assistant

## 2021-01-06 ENCOUNTER — Encounter: Payer: Self-pay | Admitting: Physician Assistant

## 2021-01-06 VITALS — BP 129/89 | HR 87 | Temp 97.6°F | Wt 147.0 lb

## 2021-01-06 DIAGNOSIS — E1165 Type 2 diabetes mellitus with hyperglycemia: Secondary | ICD-10-CM

## 2021-01-06 DIAGNOSIS — Z1211 Encounter for screening for malignant neoplasm of colon: Secondary | ICD-10-CM

## 2021-01-06 DIAGNOSIS — J449 Chronic obstructive pulmonary disease, unspecified: Secondary | ICD-10-CM

## 2021-01-06 DIAGNOSIS — E785 Hyperlipidemia, unspecified: Secondary | ICD-10-CM

## 2021-01-06 DIAGNOSIS — F17200 Nicotine dependence, unspecified, uncomplicated: Secondary | ICD-10-CM

## 2021-01-06 DIAGNOSIS — I10 Essential (primary) hypertension: Secondary | ICD-10-CM

## 2021-01-06 MED ORDER — METFORMIN HCL 1000 MG PO TABS: 1000.0000 mg | ORAL_TABLET | Freq: Two times a day (BID) | ORAL | 1 refills | Status: DC

## 2021-01-06 NOTE — Progress Notes (Signed)
BP 129/89   Pulse 87   Temp 97.6 F (36.4 C)   Wt 147 lb (66.7 kg)   SpO2 96%   BMI 23.02 kg/m    Subjective:    Patient ID: Larry Floyd, male    DOB: 02/20/1961, 60 y.o.   MRN: [id number]  HPI: Larry Floyd is a 60 y.o. male presenting on 01/06/2021 for Hypertension, Diabetes, and Hyperlipidemia   HPI   Pt had a negative covid 19 screening questionnaire.   Chief Complaint  Patient presents with   Hypertension   Diabetes   Hyperlipidemia     Pt says he is doing well and has no complaints.  He is Still smoking.  He says his Breathing is okay and he is having no sob or CP.     Relevant past medical, surgical, family and social history reviewed and updated as indicated. Interim medical history since our last visit reviewed. Allergies and medications reviewed and updated.    Current Outpatient Medications:    albuterol (PROVENTIL HFA) 108 (90 Base) MCG/ACT inhaler, INHALE 2 PUFFS BY MOUTH EVERY 6 HOURS AS NEEDED FOR COUGHING, WHEEZING, OR SHORTNESS OF BREATH, Disp: 6.7 g, Rfl: 1   atorvastatin (LIPITOR) 10 MG tablet, Take 1 tablet (10 mg total) by mouth daily., Disp: 90 tablet, Rfl: 3   cetirizine (ZYRTEC) 10 MG tablet, TAKE 1/2 Tablet BY MOUTH ONCE DAILY, Disp: 45 tablet, Rfl: 1   Fluticasone-Salmeterol (ADVAIR) 100-50 MCG/DOSE AEPB, INHALE 1 PUFF BY MOUTH TWICE DAILY (EVERY 12 HOURS). RINSE MOUTH AFTER USE. (IN THE morning and at bedtime), Disp: 60 each, Rfl: 1   gabapentin (NEURONTIN) 600 MG tablet, Take 1 tablet (600 mg total) by mouth 2 (two) times daily., Disp: 60 tablet, Rfl: 3   hydrochlorothiazide (HYDRODIURIL) 25 MG tablet, Take 1 tablet (25 mg total) by mouth daily., Disp: 90 tablet, Rfl: 3   metFORMIN (GLUCOPHAGE) 500 MG tablet, Take 1 tablet (500 mg total) by mouth 2 (two) times daily with a meal., Disp: 180 tablet, Rfl: 3   nicotine (NICOTROL) 10 MG inhaler, Inhale 1 continuous puffing into the lungs as needed for smoking cessation., Disp: , Rfl:     Omega-3 Fatty Acids (FISH OIL) 1000 MG CPDR, Take 1 capsule by mouth in the morning and at bedtime., Disp: , Rfl:    omeprazole (PRILOSEC) 20 MG capsule, Take 1 capsule (20 mg total) by mouth daily., Disp: 90 capsule, Rfl: 1    Review of Systems  Per HPI unless specifically indicated above     Objective:    BP 129/89   Pulse 87   Temp 97.6 F (36.4 C)   Wt 147 lb (66.7 kg)   SpO2 96%   BMI 23.02 kg/m   Wt Readings from Last 3 Encounters:  01/06/21 147 lb (66.7 kg)  10/09/20 155 lb (70.3 kg)  03/31/20 159 lb (72.1 kg)    Physical Exam Vitals reviewed.  Constitutional:      General: He is not in acute distress.    Appearance: He is well-developed. He is not ill-appearing.  HENT:     Head: Normocephalic and atraumatic.  Cardiovascular:     Rate and Rhythm: Normal rate and regular rhythm.  Pulmonary:     Effort: Pulmonary effort is normal.     Breath sounds: Normal breath sounds. No wheezing.  Abdominal:     General: Bowel sounds are normal.     Palpations: Abdomen is soft.     Tenderness: There is  no abdominal tenderness.  Musculoskeletal:     Cervical back: Neck supple.     Right lower leg: No edema.     Left lower leg: No edema.  Lymphadenopathy:     Cervical: No cervical adenopathy.  Skin:    General: Skin is warm and dry.  Neurological:     Mental Status: He is alert and oriented to person, place, and time.  Psychiatric:        Attention and Perception: Attention normal.        Speech: Speech normal.        Behavior: Behavior normal. Behavior is cooperative.    Results for orders placed or performed during the hospital encounter of 01/01/21  Lipid panel  Result Value Ref Range   Cholesterol 166 0 - 200 mg/dL   Triglycerides 701 (H) <150 mg/dL   HDL 30 (L) >77 mg/dL   Total CHOL/HDL Ratio 5.5 RATIO   VLDL 74 (H) 0 - 40 mg/dL   LDL Cholesterol 62 0 - 99 mg/dL  Hemoglobin L3J  Result Value Ref Range   Hgb A1c MFr Bld 10.8 (H) 4.8 - 5.6 %   Mean  Plasma Glucose 263.26 mg/dL  Comprehensive metabolic panel  Result Value Ref Range   Sodium 125 (L) 135 - 145 mmol/L   Potassium 3.5 3.5 - 5.1 mmol/L   Chloride 90 (L) 98 - 111 mmol/L   CO2 24 22 - 32 mmol/L   Glucose, Bld 382 (H) 70 - 99 mg/dL   BUN 13 6 - 20 mg/dL   Creatinine, Ser 0.30 0.61 - 1.24 mg/dL   Calcium 8.7 (L) 8.9 - 10.3 mg/dL   Total Protein 8.5 (H) 6.5 - 8.1 g/dL   Albumin 4.5 3.5 - 5.0 g/dL   AST 14 (L) 15 - 41 U/L   ALT 20 0 - 44 U/L   Alkaline Phosphatase 135 (H) 38 - 126 U/L   Total Bilirubin 0.7 0.3 - 1.2 mg/dL   GFR, Estimated >09 >23 mL/min   Anion gap 11 5 - 15      Assessment & Plan:    Encounter Diagnoses  Name Primary?   Uncontrolled type 2 diabetes mellitus with hyperglycemia (HCC) Yes   Essential hypertension    Hyperlipidemia, unspecified hyperlipidemia type    Chronic obstructive pulmonary disease, unspecified COPD type (HCC)    Tobacco use disorder    Screening for colon cancer      -reviewed labs with pt -will Increase metformin to 1g bid and continue other medications as currently prescribed -he is encouraged to follow diabetic diet  -pt was given FIT test for colon cancer screening -pt was encouraged to stop smoking -pt to follow up in 3 months.  He is to contact office sooner prn

## 2021-01-07 ENCOUNTER — Other Ambulatory Visit: Payer: Self-pay | Admitting: Physician Assistant

## 2021-01-07 DIAGNOSIS — Z1211 Encounter for screening for malignant neoplasm of colon: Secondary | ICD-10-CM

## 2021-03-23 ENCOUNTER — Other Ambulatory Visit: Payer: Self-pay | Admitting: Physician Assistant

## 2021-03-25 ENCOUNTER — Other Ambulatory Visit: Payer: Self-pay | Admitting: Physician Assistant

## 2021-03-25 DIAGNOSIS — E1165 Type 2 diabetes mellitus with hyperglycemia: Secondary | ICD-10-CM

## 2021-03-25 DIAGNOSIS — E785 Hyperlipidemia, unspecified: Secondary | ICD-10-CM

## 2021-03-25 DIAGNOSIS — I10 Essential (primary) hypertension: Secondary | ICD-10-CM

## 2021-04-08 ENCOUNTER — Other Ambulatory Visit (HOSPITAL_COMMUNITY)
Admission: RE | Admit: 2021-04-08 | Discharge: 2021-04-08 | Disposition: A | Discharge status: Home / Self Care | Payer: Self-pay | Source: Ambulatory Visit | Attending: Physician Assistant | Admitting: Physician Assistant

## 2021-04-08 DIAGNOSIS — E785 Hyperlipidemia, unspecified: Secondary | ICD-10-CM

## 2021-04-08 DIAGNOSIS — E1165 Type 2 diabetes mellitus with hyperglycemia: Secondary | ICD-10-CM

## 2021-04-08 DIAGNOSIS — I10 Essential (primary) hypertension: Secondary | ICD-10-CM

## 2021-04-08 LAB — COMPREHENSIVE METABOLIC PANEL
ALT: 17 U/L (ref 0–44)
AST: 16 U/L (ref 15–41)
Albumin: 4.3 g/dL (ref 3.5–5.0)
Alkaline Phosphatase: 91 U/L (ref 38–126)
Anion gap: 7 (ref 5–15)
BUN: 13 mg/dL (ref 6–20)
CO2: 26 mmol/L (ref 22–32)
Calcium: 9.3 mg/dL (ref 8.9–10.3)
Chloride: 98 mmol/L (ref 98–111)
Creatinine, Ser: 0.66 mg/dL (ref 0.61–1.24)
GFR, Estimated: >60 mL/min (ref >60)
Glucose, Bld: 127 mg/dL — ABNORMAL HIGH (ref 70–99)
Potassium: 3.6 mmol/L (ref 3.5–5.1)
Sodium: 131 mmol/L — ABNORMAL LOW (ref 135–145)
Total Bilirubin: 0.4 mg/dL (ref 0.3–1.2)
Total Protein: 8 g/dL (ref 6.5–8.1)

## 2021-04-08 LAB — LIPID PANEL
Cholesterol: 146 mg/dL (ref 0–200)
HDL: 34 mg/dL — ABNORMAL LOW (ref >40)
LDL Cholesterol: 48 mg/dL (ref 0–99)
Total CHOL/HDL Ratio: 4.3 RATIO
Triglycerides: 320 mg/dL — ABNORMAL HIGH (ref <150)
VLDL: 64 mg/dL — ABNORMAL HIGH (ref 0–40)

## 2021-04-08 LAB — HEMOGLOBIN A1C
Hgb A1c MFr Bld: 7.6 % — ABNORMAL HIGH (ref 4.8–5.6)
Mean Plasma Glucose: 171.42 mg/dL

## 2021-04-13 ENCOUNTER — Encounter: Payer: Self-pay | Admitting: Physician Assistant

## 2021-04-13 ENCOUNTER — Ambulatory Visit: Payer: Medicaid Other | Admitting: Physician Assistant

## 2021-04-13 ENCOUNTER — Other Ambulatory Visit: Payer: Self-pay

## 2021-04-13 VITALS — BP 138/80 | HR 73 | Temp 97.7°F | Wt 149.0 lb

## 2021-04-13 DIAGNOSIS — J449 Chronic obstructive pulmonary disease, unspecified: Secondary | ICD-10-CM

## 2021-04-13 DIAGNOSIS — K0889 Other specified disorders of teeth and supporting structures: Secondary | ICD-10-CM

## 2021-04-13 DIAGNOSIS — E1140 Type 2 diabetes mellitus with diabetic neuropathy, unspecified: Secondary | ICD-10-CM

## 2021-04-13 DIAGNOSIS — E785 Hyperlipidemia, unspecified: Secondary | ICD-10-CM

## 2021-04-13 DIAGNOSIS — E1165 Type 2 diabetes mellitus with hyperglycemia: Secondary | ICD-10-CM

## 2021-04-13 DIAGNOSIS — I10 Essential (primary) hypertension: Secondary | ICD-10-CM

## 2021-04-13 DIAGNOSIS — F17200 Nicotine dependence, unspecified, uncomplicated: Secondary | ICD-10-CM

## 2021-04-13 MED ORDER — OMEPRAZOLE 20 MG PO CPDR: 20.0000 mg | DELAYED_RELEASE_CAPSULE | Freq: Every day | ORAL | 1 refills | Status: DC

## 2021-04-13 MED ORDER — FLUTICASONE-SALMETEROL 100-50 MCG/ACT IN AEPB: 1.0000 | INHALATION_SPRAY | Freq: Two times a day (BID) | RESPIRATORY_TRACT | 3 refills | Status: DC

## 2021-04-13 MED ORDER — CETIRIZINE HCL 10 MG PO TABS: 5.0000 mg | ORAL_TABLET | Freq: Every day | ORAL | 1 refills | Status: DC

## 2021-04-13 MED ORDER — SITAGLIPTIN PHOSPHATE 100 MG PO TABS: 100.0000 mg | ORAL_TABLET | Freq: Every day | ORAL | 1 refills | Status: DC

## 2021-04-13 MED ORDER — HYDROCHLOROTHIAZIDE 25 MG PO TABS: 25.0000 mg | ORAL_TABLET | Freq: Every day | ORAL | 3 refills | Status: DC

## 2021-04-13 MED ORDER — METFORMIN HCL 1000 MG PO TABS: 1000.0000 mg | ORAL_TABLET | Freq: Two times a day (BID) | ORAL | 1 refills | Status: DC

## 2021-04-13 MED ORDER — AMOXICILLIN 500 MG PO CAPS: 500.0000 mg | ORAL_CAPSULE | Freq: Three times a day (TID) | ORAL | 0 refills | Status: AC

## 2021-04-13 MED ORDER — GABAPENTIN 600 MG PO TABS: ORAL_TABLET | ORAL | 3 refills | Status: DC

## 2021-04-13 MED ORDER — ATORVASTATIN CALCIUM 10 MG PO TABS: 10.0000 mg | ORAL_TABLET | Freq: Every day | ORAL | 3 refills | Status: DC

## 2021-04-13 MED ORDER — ALBUTEROL SULFATE HFA 108 (90 BASE) MCG/ACT IN AERS: 2.0000 | INHALATION_SPRAY | Freq: Four times a day (QID) | RESPIRATORY_TRACT | 1 refills | Status: DC | PRN

## 2021-04-13 NOTE — Progress Notes (Signed)
BP 138/80   Pulse 73   Temp 97.7 F (36.5 C)   Wt 149 lb (67.6 kg)   SpO2 93%   BMI 23.34 kg/m    Subjective:    Patient ID: Larry Floyd, male    DOB: May 02, 1961, 60 y.o.   MRN: [id number]  HPI: Larry Floyd is a 60 y.o. male presenting on 04/13/2021 for No chief complaint on file.   HPI  Pt is 60yoM with routine appointment to follow up DM, htn and dyslipidemia.    He got covid booster but doesn't have his card.  He says he is doing well and has no new complaints except some dental issues with decayed teeth hurting.  He continues to smoke.    Relevant past medical, surgical, family and social history reviewed and updated as indicated. Interim medical history since our last visit reviewed. Allergies and medications reviewed and updated.   Current Outpatient Medications:    albuterol (VENTOLIN HFA) 108 (90 Base) MCG/ACT inhaler, INHALE 2 PUFFS BY MOUTH EVERY 6 HOURS AS NEEDED FOR COUGHING, WHEEZING, OR SHORTNESS OF BREATH, Disp: 6.7 g, Rfl: 1   atorvastatin (LIPITOR) 10 MG tablet, Take 1 tablet (10 mg total) by mouth daily., Disp: 90 tablet, Rfl: 3   cetirizine (ZYRTEC) 10 MG tablet, TAKE 1/2 Tablet BY MOUTH ONCE DAILY, Disp: 45 tablet, Rfl: 1   Fluticasone-Salmeterol (ADVAIR) 100-50 MCG/DOSE AEPB, INHALE 1 PUFF BY MOUTH TWICE DAILY (EVERY 12 HOURS). RINSE MOUTH AFTER USE. (IN THE morning and at bedtime), Disp: 60 each, Rfl: 1   gabapentin (NEURONTIN) 600 MG tablet, Take 1 tablet (600 mg total) by mouth 2 (two) times daily., Disp: 60 tablet, Rfl: 3   hydrochlorothiazide (HYDRODIURIL) 25 MG tablet, Take 1 tablet (25 mg total) by mouth daily., Disp: 90 tablet, Rfl: 3   metFORMIN (GLUCOPHAGE) 1000 MG tablet, Take 1 tablet (1,000 mg total) by mouth 2 (two) times daily with a meal., Disp: 180 tablet, Rfl: 1   nicotine (NICOTROL) 10 MG inhaler, Inhale 1 continuous puffing into the lungs as needed for smoking cessation., Disp: , Rfl:    Omega-3 Fatty Acids (FISH OIL) 1000 MG  CPDR, Take 1 capsule by mouth in the morning and at bedtime., Disp: , Rfl:    omeprazole (PRILOSEC) 20 MG capsule, Take 1 capsule (20 mg total) by mouth daily., Disp: 90 capsule, Rfl: 1     Review of Systems  Per HPI unless specifically indicated above     Objective:    BP 138/80   Pulse 73   Temp 97.7 F (36.5 C)   Wt 149 lb (67.6 kg)   SpO2 93%   BMI 23.34 kg/m   Wt Readings from Last 3 Encounters:  04/13/21 149 lb (67.6 kg)  01/06/21 147 lb (66.7 kg)  10/09/20 155 lb (70.3 kg)    Physical Exam Vitals reviewed.  Constitutional:      General: He is not in acute distress.    Appearance: He is well-developed. He is not ill-appearing.  HENT:     Head: Normocephalic and atraumatic.     Mouth/Throat:     Dentition: Abnormal dentition. Dental caries present. No dental abscesses.  Cardiovascular:     Rate and Rhythm: Normal rate and regular rhythm.  Pulmonary:     Effort: Pulmonary effort is normal.     Breath sounds: Normal breath sounds. No wheezing.  Abdominal:     General: Bowel sounds are normal.     Palpations: Abdomen is  soft.     Tenderness: There is no abdominal tenderness.  Musculoskeletal:     Cervical back: Neck supple.     Right lower leg: No edema.     Left lower leg: No edema.  Lymphadenopathy:     Cervical: No cervical adenopathy.  Skin:    General: Skin is warm and dry.  Neurological:     Mental Status: He is alert and oriented to person, place, and time.  Psychiatric:        Behavior: Behavior normal.    Results for orders placed or performed during the hospital encounter of 04/08/21  Hemoglobin A1c  Result Value Ref Range   Hgb A1c MFr Bld 7.6 (H) 4.8 - 5.6 %   Mean Plasma Glucose 171.42 mg/dL  Lipid panel  Result Value Ref Range   Cholesterol 146 0 - 200 mg/dL   Triglycerides 147 (H) <150 mg/dL   HDL 34 (L) >82 mg/dL   Total CHOL/HDL Ratio 4.3 RATIO   VLDL 64 (H) 0 - 40 mg/dL   LDL Cholesterol 48 0 - 99 mg/dL  Comprehensive  metabolic panel  Result Value Ref Range   Sodium 131 (L) 135 - 145 mmol/L   Potassium 3.6 3.5 - 5.1 mmol/L   Chloride 98 98 - 111 mmol/L   CO2 26 22 - 32 mmol/L   Glucose, Bld 127 (H) 70 - 99 mg/dL   BUN 13 6 - 20 mg/dL   Creatinine, Ser 9.56 0.61 - 1.24 mg/dL   Calcium 9.3 8.9 - 21.3 mg/dL   Total Protein 8.0 6.5 - 8.1 g/dL   Albumin 4.3 3.5 - 5.0 g/dL   AST 16 15 - 41 U/L   ALT 17 0 - 44 U/L   Alkaline Phosphatase 91 38 - 126 U/L   Total Bilirubin 0.4 0.3 - 1.2 mg/dL   GFR, Estimated >08 >65 mL/min   Anion gap 7 5 - 15      Assessment & Plan:    Encounter Diagnoses  Name Primary?   Uncontrolled type 2 diabetes mellitus with hyperglycemia (HCC)    Essential hypertension    Hyperlipidemia, unspecified hyperlipidemia type    Chronic obstructive pulmonary disease, unspecified COPD type (HCC)    Type 2 diabetes mellitus with diabetic neuropathy, without long-term current use of insulin (HCC) Yes   Tobacco use disorder    Dentalgia      -Reviewed labs with pt  -Pt to continue current medications and will Add januvia-  -Amoxil and referral for dental list.  Encouraged smoking cessation and good oral hygiene   -F/u 3 months.  Pt to contact office sooner prn

## 2021-04-13 NOTE — Patient Instructions (Signed)
Dental Caries, Adult Dental caries are spots of decay (cavities) in teeth. They are in the outer layer of your tooth (enamel). Treat them as soon as you can. If they are not treated, they can spread decay and lead to painful infection. What are the causes? This condition is caused by acid that is produced when bacteria in your mouth break down sugary foods and liquids. What increases the risk? This condition is more likely to develop in people who: Drink a lot of sugary liquids. Eat a lot of sweets and carbohydrates. Drink water that does not have fluoride. Do not clean their teeth regularly. Take medicines that decrease saliva. What are the signs or symptoms? Symptoms of this condition include: [place], brown, or black spots on the teeth. Pain as the decay goes deeper into the tooth. Swelling or bleeding in the gums. How is this treated? This condition is treated with a procedure to remove decay and to restore the tooth. Follow these instructions at home: Take good care of your mouth and teeth. This keeps them healthy. Brush your teeth 2 times a day. Use toothpaste with fluoride in it. Floss your teeth once a day. If your dentist prescribed an antibiotic medicine, take it as told. Do not stop taking the antibiotic even if your condition gets better. Keep all follow-up visits as told by your dentist. This is important. This includes all cleanings. How is this prevented? To prevent dental caries: Brush your teeth every morning and night. Use fluoride toothpaste. Floss your teeth once a day. Get regular dental cleanings. If told by your dentist: Wash your mouth with prescription mouthwash (chlorhexidine). Put topical fluoride on your teeth. Drink water with fluoride in it. Drink water instead of sugary drinks. Eat healthy meals and snacks. Have fluoride treatments in the dentist's office and sealants put on your teeth, if told by your dentist. Contact a doctor if: You have symptoms  of tooth decay. Summary Dental caries are spots of decay (cavities) in teeth. It is important to treat this as soon as you see them. This condition is caused by an acid that comes when sugar breaks down in your mouth. To prevent this condition, brush your teeth often and have regular dental cleanings. Take an antibiotic to treat an infection, if told by your dentist. Do not stop taking the antibiotic even if your condition gets better. Have regular dental cleanings and keep all follow-up visits. This information is not intended to replace advice given to you by your health care provider. Make sure you discuss any questions you have with your health care provider. Document Revised: 04/20/2019 Document Reviewed: 04/20/2019 Elsevier Patient Education  2022 [employer].

## 2021-07-01 ENCOUNTER — Other Ambulatory Visit: Payer: Self-pay | Admitting: Physician Assistant

## 2021-07-01 DIAGNOSIS — I10 Essential (primary) hypertension: Secondary | ICD-10-CM

## 2021-07-01 DIAGNOSIS — E1165 Type 2 diabetes mellitus with hyperglycemia: Secondary | ICD-10-CM

## 2021-07-01 DIAGNOSIS — Z125 Encounter for screening for malignant neoplasm of prostate: Secondary | ICD-10-CM

## 2021-07-01 DIAGNOSIS — E785 Hyperlipidemia, unspecified: Secondary | ICD-10-CM

## 2021-07-08 ENCOUNTER — Other Ambulatory Visit (HOSPITAL_COMMUNITY)
Admission: RE | Admit: 2021-07-08 | Discharge: 2021-07-08 | Disposition: A | Discharge status: Home / Self Care | Payer: Medicaid Other | Source: Ambulatory Visit | Attending: Physician Assistant | Admitting: Physician Assistant

## 2021-07-08 DIAGNOSIS — Z125 Encounter for screening for malignant neoplasm of prostate: Secondary | ICD-10-CM | POA: Insufficient documentation

## 2021-07-08 DIAGNOSIS — E785 Hyperlipidemia, unspecified: Secondary | ICD-10-CM | POA: Insufficient documentation

## 2021-07-08 DIAGNOSIS — I10 Essential (primary) hypertension: Secondary | ICD-10-CM | POA: Insufficient documentation

## 2021-07-08 DIAGNOSIS — E1165 Type 2 diabetes mellitus with hyperglycemia: Secondary | ICD-10-CM | POA: Insufficient documentation

## 2021-07-08 LAB — COMPREHENSIVE METABOLIC PANEL
ALT: 16 U/L (ref 0–44)
AST: 16 U/L (ref 15–41)
Albumin: 4.2 g/dL (ref 3.5–5.0)
Alkaline Phosphatase: 90 U/L (ref 38–126)
Anion gap: 6 (ref 5–15)
BUN: 9 mg/dL (ref 6–20)
CO2: 26 mmol/L (ref 22–32)
Calcium: 8.9 mg/dL (ref 8.9–10.3)
Chloride: 99 mmol/L (ref 98–111)
Creatinine, Ser: 0.73 mg/dL (ref 0.61–1.24)
GFR, Estimated: >60 mL/min (ref >60)
Glucose, Bld: 90 mg/dL (ref 70–99)
Potassium: 3.5 mmol/L (ref 3.5–5.1)
Sodium: 131 mmol/L — ABNORMAL LOW (ref 135–145)
Total Bilirubin: 0.4 mg/dL (ref 0.3–1.2)
Total Protein: 8.2 g/dL — ABNORMAL HIGH (ref 6.5–8.1)

## 2021-07-08 LAB — LIPID PANEL
Cholesterol: 145 mg/dL (ref 0–200)
HDL: 33 mg/dL — ABNORMAL LOW (ref >40)
LDL Cholesterol: 59 mg/dL (ref 0–99)
Total CHOL/HDL Ratio: 4.4 RATIO
Triglycerides: 265 mg/dL — ABNORMAL HIGH (ref <150)
VLDL: 53 mg/dL — ABNORMAL HIGH (ref 0–40)

## 2021-07-08 LAB — PSA: Prostatic Specific Antigen: 0.48 ng/mL (ref 0.00–4.00)

## 2021-07-09 LAB — HEMOGLOBIN A1C
Hgb A1c MFr Bld: 6.7 % — ABNORMAL HIGH (ref 4.8–5.6)
Mean Plasma Glucose: 146 mg/dL

## 2021-07-13 ENCOUNTER — Encounter: Payer: Self-pay | Admitting: Physician Assistant

## 2021-07-13 ENCOUNTER — Other Ambulatory Visit: Payer: Self-pay

## 2021-07-13 ENCOUNTER — Ambulatory Visit: Payer: Medicaid Other | Admitting: Physician Assistant

## 2021-07-13 VITALS — BP 132/70 | HR 75 | Temp 97.2°F | Wt 146.0 lb

## 2021-07-13 DIAGNOSIS — E1140 Type 2 diabetes mellitus with diabetic neuropathy, unspecified: Secondary | ICD-10-CM

## 2021-07-13 DIAGNOSIS — J449 Chronic obstructive pulmonary disease, unspecified: Secondary | ICD-10-CM

## 2021-07-13 DIAGNOSIS — E785 Hyperlipidemia, unspecified: Secondary | ICD-10-CM

## 2021-07-13 DIAGNOSIS — F17200 Nicotine dependence, unspecified, uncomplicated: Secondary | ICD-10-CM

## 2021-07-13 DIAGNOSIS — I10 Essential (primary) hypertension: Secondary | ICD-10-CM

## 2021-07-13 NOTE — Progress Notes (Signed)
BP 132/70    Pulse 75    Temp (!) 97.2 F (36.2 C)    Wt 146 lb (66.2 kg)    SpO2 97%    BMI 22.87 kg/m    Subjective:    Patient ID: Larry Floyd, male    DOB: 12/10/1960, 61 y.o.   MRN: [id number]  HPI: Larry Floyd is a 61 y.o. male presenting on 07/13/2021 for Diabetes, Hyperlipidemia, and Hypertension   HPI   Chief Complaint  Patient presents with   Diabetes   Hyperlipidemia   Hypertension    Pt is Still smoking He Still has FIT test given in August for colon cancer screening. He says he is doing well and he has no complaints.    Relevant past medical, surgical, family and social history reviewed and updated as indicated. Interim medical history since our last visit reviewed. Allergies and medications reviewed and updated.   Current Outpatient Medications:    albuterol (VENTOLIN HFA) 108 (90 Base) MCG/ACT inhaler, Inhale 2 puffs into the lungs every 6 (six) hours as needed for wheezing or shortness of breath., Disp: 3 each, Rfl: 1   atorvastatin (LIPITOR) 10 MG tablet, Take 1 tablet (10 mg total) by mouth daily., Disp: 90 tablet, Rfl: 3   cetirizine (ZYRTEC) 10 MG tablet, Take 0.5 tablets (5 mg total) by mouth daily., Disp: 45 tablet, Rfl: 1   fluticasone-salmeterol (ADVAIR) 100-50 MCG/ACT AEPB, Inhale 1 puff into the lungs 2 (two) times daily. Rinse mouth after use, Disp: 1 each, Rfl: 3   gabapentin (NEURONTIN) 600 MG tablet, 1/2 po qam and 1 po qhs, Disp: 45 tablet, Rfl: 3   hydrochlorothiazide (HYDRODIURIL) 25 MG tablet, Take 1 tablet (25 mg total) by mouth daily., Disp: 90 tablet, Rfl: 3   metFORMIN (GLUCOPHAGE) 1000 MG tablet, Take 1 tablet (1,000 mg total) by mouth 2 (two) times daily with a meal., Disp: 180 tablet, Rfl: 1   nicotine (NICOTROL) 10 MG inhaler, Inhale 1 continuous puffing into the lungs as needed for smoking cessation., Disp: , Rfl:    Omega-3 Fatty Acids (FISH OIL) 1000 MG CPDR, Take 1 capsule by mouth in the morning and at bedtime., Disp: ,  Rfl:    omeprazole (PRILOSEC) 20 MG capsule, Take 1 capsule (20 mg total) by mouth daily., Disp: 90 capsule, Rfl: 1   sitaGLIPtin (JANUVIA) 100 MG tablet, Take 1 tablet (100 mg total) by mouth daily., Disp: 90 tablet, Rfl: 1    Review of Systems  Per HPI unless specifically indicated above     Objective:    BP 132/70    Pulse 75    Temp (!) 97.2 F (36.2 C)    Wt 146 lb (66.2 kg)    SpO2 97%    BMI 22.87 kg/m   Wt Readings from Last 3 Encounters:  07/13/21 146 lb (66.2 kg)  04/13/21 149 lb (67.6 kg)  01/06/21 147 lb (66.7 kg)    Physical Exam Vitals reviewed.  Constitutional:      General: He is not in acute distress.    Appearance: He is well-developed. He is not ill-appearing.  HENT:     Head: Normocephalic and atraumatic.  Cardiovascular:     Rate and Rhythm: Normal rate and regular rhythm.  Pulmonary:     Effort: Pulmonary effort is normal.     Breath sounds: Normal breath sounds. No wheezing.  Abdominal:     General: Bowel sounds are normal.     Palpations: Abdomen  is soft.     Tenderness: There is no abdominal tenderness.  Musculoskeletal:     Cervical back: Neck supple.     Right lower leg: No edema.     Left lower leg: No edema.  Lymphadenopathy:     Cervical: No cervical adenopathy.  Skin:    General: Skin is warm and dry.  Neurological:     Mental Status: He is alert and oriented to person, place, and time.  Psychiatric:        Behavior: Behavior normal.    Results for orders placed or performed during the hospital encounter of 07/08/21  PSA  Result Value Ref Range   Prostatic Specific Antigen 0.48 0.00 - 4.00 ng/mL  Lipid panel  Result Value Ref Range   Cholesterol 145 0 - 200 mg/dL   Triglycerides 265 (H) <150 mg/dL   HDL 33 (L) >40 mg/dL   Total CHOL/HDL Ratio 4.4 RATIO   VLDL 53 (H) 0 - 40 mg/dL   LDL Cholesterol 59 0 - 99 mg/dL  Hemoglobin A1c  Result Value Ref Range   Hgb A1c MFr Bld 6.7 (H) 4.8 - 5.6 %   Mean Plasma Glucose 146 mg/dL   Comprehensive metabolic panel  Result Value Ref Range   Sodium 131 (L) 135 - 145 mmol/L   Potassium 3.5 3.5 - 5.1 mmol/L   Chloride 99 98 - 111 mmol/L   CO2 26 22 - 32 mmol/L   Glucose, Bld 90 70 - 99 mg/dL   BUN 9 6 - 20 mg/dL   Creatinine, Ser 0.73 0.61 - 1.24 mg/dL   Calcium 8.9 8.9 - 10.3 mg/dL   Total Protein 8.2 (H) 6.5 - 8.1 g/dL   Albumin 4.2 3.5 - 5.0 g/dL   AST 16 15 - 41 U/L   ALT 16 0 - 44 U/L   Alkaline Phosphatase 90 38 - 126 U/L   Total Bilirubin 0.4 0.3 - 1.2 mg/dL   GFR, Estimated >60 >60 mL/min   Anion gap 6 5 - 15      Assessment & Plan:    Encounter Diagnoses  Name Primary?   Type 2 diabetes mellitus with diabetic neuropathy, without long-term current use of insulin ([place]) Yes   Essential hypertension    Hyperlipidemia, unspecified hyperlipidemia type    Chronic obstructive pulmonary disease, unspecified COPD type ([place])    Tobacco use disorder      -reviewed labs with pt -pt to continue current medications -Encouraged covid booster -encouraged Smoking cessation -pt to follow up 3 months.   He is to contact office sooner prn

## 2021-07-15 ENCOUNTER — Other Ambulatory Visit: Payer: Self-pay | Admitting: Physician Assistant

## 2021-07-15 MED ORDER — FLUTICASONE-SALMETEROL 100-50 MCG/ACT IN AEPB: 1.0000 | INHALATION_SPRAY | Freq: Two times a day (BID) | RESPIRATORY_TRACT | 3 refills | Status: DC

## 2021-08-11 ENCOUNTER — Other Ambulatory Visit: Payer: Self-pay

## 2021-08-11 ENCOUNTER — Emergency Department (HOSPITAL_COMMUNITY)
Admission: EM | Admit: 2021-08-11 | Discharge: 2021-08-11 | Disposition: A | Discharge status: Home / Self Care | Payer: 59 | Attending: Student | Admitting: Student

## 2021-08-11 ENCOUNTER — Encounter (HOSPITAL_COMMUNITY): Payer: Self-pay | Admitting: *Deleted

## 2021-08-11 DIAGNOSIS — Z79899 Other long term (current) drug therapy: Secondary | ICD-10-CM | POA: Diagnosis not present

## 2021-08-11 DIAGNOSIS — Z7951 Long term (current) use of inhaled steroids: Secondary | ICD-10-CM | POA: Diagnosis not present

## 2021-08-11 DIAGNOSIS — I10 Essential (primary) hypertension: Secondary | ICD-10-CM | POA: Diagnosis not present

## 2021-08-11 DIAGNOSIS — M25562 Pain in left knee: Secondary | ICD-10-CM | POA: Insufficient documentation

## 2021-08-11 DIAGNOSIS — J449 Chronic obstructive pulmonary disease, unspecified: Secondary | ICD-10-CM | POA: Diagnosis not present

## 2021-08-11 DIAGNOSIS — Z7984 Long term (current) use of oral hypoglycemic drugs: Secondary | ICD-10-CM | POA: Diagnosis not present

## 2021-08-11 DIAGNOSIS — E119 Type 2 diabetes mellitus without complications: Secondary | ICD-10-CM | POA: Insufficient documentation

## 2021-08-11 DIAGNOSIS — M25522 Pain in left elbow: Secondary | ICD-10-CM | POA: Insufficient documentation

## 2021-08-11 HISTORY — DX: Type 2 diabetes mellitus without complications: E11.9

## 2021-08-11 MED ORDER — CYCLOBENZAPRINE HCL 10 MG PO TABS: 10.0000 mg | ORAL_TABLET | Freq: Every evening | ORAL | 0 refills | Status: DC | PRN

## 2021-08-11 NOTE — Discharge Instructions (Addendum)
I am prescribing you a strong muscle relaxer called flexeril. Please only take this medication once in the evening with dinner. This medication can make you quite drowsy. Do not mix it with alcohol. Do not drive a vehicle after taking it.  ? ?Below is the contact information for Dr. Romeo Apple.  He is a Investment banker, corporate.  If you continue to experience pain in the left knee and elbow, please give him a call and schedule an appointment for reevaluation. ? ?If you develop any new or worsening symptoms whatsoever please come back to the emergency department for reevaluation. ?

## 2021-08-11 NOTE — ED Triage Notes (Signed)
Left arm pain x 1 week, stabbing type pain that starts in elbow and radiates up arm just below his shoulder.   Left knee pain this morning.  Denies any injury.  ?

## 2021-08-11 NOTE — ED Provider Notes (Signed)
?[facility] ?Provider Note ? ? ?CSN: [id number] ?Arrival date & time: 08/11/21  1508 ? ?  ? ?History ? ?Chief Complaint  ?Patient presents with  ? Arm Pain  ? ? ?NYSHAWN GOWDY is a 61 y.o. male. ? ?HPI ?Patient is a 61 year old male with a history of COPD, hyperlipidemia, hypertension, diabetes mellitus, who presents to the emergency department due to atraumatic left elbow and knee pain.  Patient states that he was at work about 2 weeks ago and began experiencing left elbow pain.  States that it spans just proximal to the elbow and radiates across the elbow.  Initially started when reaching for an object at work.  States that he had stiffness in the region for about 1 week before symptoms gradually resolved.  This morning he then began experiencing similar pain in the region.  Denies any numbness, weakness, chest pain, shortness of breath.  Additionally notes some mild left anterior knee pain that began this morning as well.  Worsens when standing and ambulating.  No leg swelling, fevers, chills, nausea, vomiting, joint swelling. ?  ? ?Home Medications ?Prior to Admission medications   ?Medication Sig Start Date End Date Taking? Authorizing Provider  ?cyclobenzaprine (FLEXERIL) 10 MG tablet Take 1 tablet (10 mg total) by mouth at bedtime as needed for muscle spasms. 08/11/21  Yes Placido Sou, PA-C  ?albuterol (VENTOLIN HFA) 108 (90 Base) MCG/ACT inhaler Inhale 2 puffs into the lungs every 6 (six) hours as needed for wheezing or shortness of breath. 04/13/21   Jacquelin Hawking, PA-C  ?atorvastatin (LIPITOR) 10 MG tablet Take 1 tablet (10 mg total) by mouth daily. 04/13/21   Jacquelin Hawking, PA-C  ?cetirizine (ZYRTEC) 10 MG tablet Take 0.5 tablets (5 mg total) by mouth daily. 04/13/21   Jacquelin Hawking, PA-C  ?fluticasone-salmeterol (ADVAIR) 100-50 MCG/ACT AEPB Inhale 1 puff into the lungs 2 (two) times daily. Rinse mouth after use 07/15/21   Jacquelin Hawking, PA-C  ?gabapentin (NEURONTIN)  600 MG tablet 1/2 po qam and 1 po qhs 04/13/21   Jacquelin Hawking, PA-C  ?hydrochlorothiazide (HYDRODIURIL) 25 MG tablet Take 1 tablet (25 mg total) by mouth daily. 04/13/21   Jacquelin Hawking, PA-C  ?metFORMIN (GLUCOPHAGE) 1000 MG tablet Take 1 tablet (1,000 mg total) by mouth 2 (two) times daily with a meal. 04/13/21   Jacquelin Hawking, PA-C  ?nicotine (NICOTROL) 10 MG inhaler Inhale 1 continuous puffing into the lungs as needed for smoking cessation.    [provider]  ?Omega-3 Fatty Acids (FISH OIL) 1000 MG CPDR Take 1 capsule by mouth in the morning and at bedtime. 10/09/20   Jacquelin Hawking, PA-C  ?omeprazole (PRILOSEC) 20 MG capsule Take 1 capsule (20 mg total) by mouth daily. 04/13/21   Jacquelin Hawking, PA-C  ?sitaGLIPtin (JANUVIA) 100 MG tablet Take 1 tablet (100 mg total) by mouth daily. 04/13/21   Jacquelin Hawking, PA-C  ?   ? ?Allergies    ?Patient has no known allergies.   ? ?Review of Systems   ?Review of Systems  ?All other systems reviewed and are negative. ?Ten systems reviewed and are negative for acute change, except as noted in the HPI.   ?Physical Exam ?Updated Vital Signs ?BP (!) 145/89 (BP Location: Right Arm)   Pulse 81   Temp 97.9 ?F (36.6 ?C) (Oral)   Resp 20   Ht 5' 7.75" (1.721 m)   Wt 64.9 kg   SpO2 99%   BMI 21.90 kg/m?  ?Physical Exam ?Vitals and nursing  note reviewed.  ?Constitutional:   ?   General: He is not in acute distress. ?   Appearance: Normal appearance. He is not ill-appearing, toxic-appearing or diaphoretic.  ?HENT:  ?   Head: Normocephalic and atraumatic.  ?   Right Ear: External ear normal.  ?   Left Ear: External ear normal.  ?   Nose: Nose normal.  ?   Mouth/Throat:  ?   Mouth: Mucous membranes are moist.  ?   Pharynx: Oropharynx is clear. No oropharyngeal exudate or posterior oropharyngeal erythema.  ?Eyes:  ?   Extraocular Movements: Extraocular movements intact.  ?Cardiovascular:  ?   Rate and Rhythm: Normal rate and regular rhythm.  ?   Pulses:  Normal pulses.  ?   Heart sounds: Normal heart sounds. No murmur heard. ?  No friction rub. No gallop.  ?Pulmonary:  ?   Effort: Pulmonary effort is normal. No respiratory distress.  ?   Breath sounds: Normal breath sounds. No stridor. No wheezing, rhonchi or rales.  ?Abdominal:  ?   General: Abdomen is flat.  ?   Palpations: Abdomen is soft.  ?   Tenderness: There is no abdominal tenderness.  ?Musculoskeletal:     ?   General: Tenderness present. Normal range of motion.  ?   Cervical back: Normal range of motion and neck supple. No tenderness.  ?   Comments: Left elbow: Mild tenderness noted diffusely along the posterior elbow.  Pain appears to be worst over the olecranon.  No soft tissue swelling, increased warmth, or redness.  Full active and passive range of motion of the left shoulder, elbow, and wrist.  Strength is 5/5 in the bilateral upper extremities.  Grip strength is intact.  2+ radial pulses.  Distal sensation intact. ? ?Left knee: Mild tenderness noted overlying the patella.  No soft tissue swelling, increased warmth, or redness.  Full active and passive range of motion of the left hip, knee, and ankle.  Strength is 5/5 in the bilateral lower extremities.  2+ DP pulses.  Distal sensation intact.  ?Skin: ?   General: Skin is warm and dry.  ?Neurological:  ?   General: No focal deficit present.  ?   Mental Status: He is alert and oriented to person, place, and time.  ?Psychiatric:     ?   Mood and Affect: Mood normal.     ?   Behavior: Behavior normal.  ? ?ED Results / Procedures / Treatments   ?Labs ?(all labs ordered are listed, but only abnormal results are displayed) ?Labs Reviewed - No data to display ? ?EKG ?None ? ?Radiology ?No results found. ? ?Procedures ?Procedures  ? ?Medications Ordered in ED ?Medications - No data to display ? ?ED Course/ Medical Decision Making/ A&P ?  ?                        ?Medical Decision Making ?Risk ?Prescription drug management. ? ?Patient is a 61 year old male who  presents to the emergency department for evaluation of atraumatic left elbow and left knee pain.  Initially began experiencing left elbow pain about 2 weeks ago.  His symptoms have been intermittent.  This morning he then began developing left elbow pain as well as left knee pain. ? ?On my exam patient has palpable tenderness in both joints but no increased warmth, soft tissue swelling, redness.  Neurovascularly intact in all 4 extremities.  Full range of motion of all the joints of  all 4 extremities.  No chest pain, shortness of breath, fevers, chills, nausea, vomiting.  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  Possibly overuse injuries.  He does have palpable tenderness in both regions.  Patient denies any chest pain or shortness of breath.  No leg or arm swelling.  Heart is regular rate and rhythm without murmurs, rubs, or gallops.  Lungs are clear to auscultation bilaterally.  Does not appear consistent with ACS or DVT/PE at this time.  Patient afebrile and nontoxic-appearing.  Denies any systemic complaints.  Doubt septic joint or gout at this time. ? ?Patient notes having taken Flexeril in the past with significant relief of his symptoms.  Will discharge on a short course of Flexeril.  We discussed safety regarding this medication.  Patient given a referral to orthopedics if he finds that his symptoms are refractory.  Discussed return precautions at length.  Patient appears stable for discharge at this time and he is agreeable.  His questions were answered and he was amicable at the time of discharge. ?Final Clinical Impression(s) / ED Diagnoses ?Final diagnoses:  ?Left elbow pain  ?Acute pain of left knee  ? ?Rx / DC Orders ?ED Discharge Orders   ? ?      Ordered  ?  cyclobenzaprine (FLEXERIL) 10 MG tablet  At bedtime PRN       ? 08/11/21 1614  ? ?  ?  ? ?  ? ? ?  ?Placido SouJoldersma, Loron Weimer, PA-C ?08/11/21 1629 ? ?  ?Glendora ScoreKommor, [place], MD ?08/12/21 0008 ? ?

## 2021-10-13 ENCOUNTER — Ambulatory Visit: Payer: Medicaid Other | Admitting: Physician Assistant

## 2021-10-14 ENCOUNTER — Ambulatory Visit (INDEPENDENT_AMBULATORY_CARE_PROVIDER_SITE_OTHER): Payer: 59

## 2021-10-14 ENCOUNTER — Ambulatory Visit (INDEPENDENT_AMBULATORY_CARE_PROVIDER_SITE_OTHER): Payer: 59 | Admitting: Family Medicine

## 2021-10-14 ENCOUNTER — Encounter: Payer: Self-pay | Admitting: Family Medicine

## 2021-10-14 VITALS — BP 139/82 | HR 74 | Temp 98.1°F | Ht 67.75 in | Wt 147.5 lb

## 2021-10-14 DIAGNOSIS — K219 Gastro-esophageal reflux disease without esophagitis: Secondary | ICD-10-CM | POA: Insufficient documentation

## 2021-10-14 DIAGNOSIS — R69 Illness, unspecified: Secondary | ICD-10-CM | POA: Diagnosis not present

## 2021-10-14 DIAGNOSIS — E1169 Type 2 diabetes mellitus with other specified complication: Secondary | ICD-10-CM | POA: Diagnosis not present

## 2021-10-14 DIAGNOSIS — E119 Type 2 diabetes mellitus without complications: Secondary | ICD-10-CM | POA: Diagnosis not present

## 2021-10-14 DIAGNOSIS — Z7689 Persons encountering health services in other specified circumstances: Secondary | ICD-10-CM

## 2021-10-14 DIAGNOSIS — E1159 Type 2 diabetes mellitus with other circulatory complications: Secondary | ICD-10-CM | POA: Diagnosis not present

## 2021-10-14 DIAGNOSIS — I10 Essential (primary) hypertension: Secondary | ICD-10-CM | POA: Diagnosis not present

## 2021-10-14 DIAGNOSIS — Z23 Encounter for immunization: Secondary | ICD-10-CM | POA: Diagnosis not present

## 2021-10-14 DIAGNOSIS — M25562 Pain in left knee: Secondary | ICD-10-CM

## 2021-10-14 DIAGNOSIS — J449 Chronic obstructive pulmonary disease, unspecified: Secondary | ICD-10-CM | POA: Diagnosis not present

## 2021-10-14 DIAGNOSIS — E1165 Type 2 diabetes mellitus with hyperglycemia: Secondary | ICD-10-CM

## 2021-10-14 DIAGNOSIS — Z1159 Encounter for screening for other viral diseases: Secondary | ICD-10-CM | POA: Diagnosis not present

## 2021-10-14 DIAGNOSIS — Z114 Encounter for screening for human immunodeficiency virus [HIV]: Secondary | ICD-10-CM | POA: Diagnosis not present

## 2021-10-14 DIAGNOSIS — G8929 Other chronic pain: Secondary | ICD-10-CM | POA: Diagnosis not present

## 2021-10-14 DIAGNOSIS — Z716 Tobacco abuse counseling: Secondary | ICD-10-CM

## 2021-10-14 DIAGNOSIS — Z1211 Encounter for screening for malignant neoplasm of colon: Secondary | ICD-10-CM

## 2021-10-14 DIAGNOSIS — E785 Hyperlipidemia, unspecified: Secondary | ICD-10-CM

## 2021-10-14 DIAGNOSIS — I152 Hypertension secondary to endocrine disorders: Secondary | ICD-10-CM

## 2021-10-14 DIAGNOSIS — F17200 Nicotine dependence, unspecified, uncomplicated: Secondary | ICD-10-CM

## 2021-10-14 DIAGNOSIS — E782 Mixed hyperlipidemia: Secondary | ICD-10-CM | POA: Diagnosis not present

## 2021-10-14 LAB — BAYER DCA HB A1C WAIVED: HB A1C (BAYER DCA - WAIVED): 6.7 % — ABNORMAL HIGH (ref 4.8–5.6)

## 2021-10-14 MED ORDER — FLUTICASONE-SALMETEROL 100-50 MCG/ACT IN AEPB: 1.0000 | INHALATION_SPRAY | Freq: Two times a day (BID) | RESPIRATORY_TRACT | 3 refills | Status: DC

## 2021-10-14 NOTE — Patient Instructions (Signed)
Smoking Tobacco Information, Adult Smoking tobacco can be harmful to your health. Tobacco contains a toxic colorless chemical called nicotine. Nicotine causes changes in your brain that make you want more and more. This is called addiction. This can make it hard to stop smoking once you start. Tobacco also has other toxic chemicals that can hurt your body and raise your risk of many cancers. Menthol or "lite" tobacco or cigarette brands are not safer than regular brands. How can smoking tobacco affect me? Smoking tobacco puts you at risk for: Cancer. Smoking is most commonly associated with lung cancer, but can also lead to cancer in other parts of the body. Chronic obstructive pulmonary disease (COPD). This is a long-term lung condition that makes it hard to breathe. It also gets worse over time. High blood pressure (hypertension), heart disease, stroke, heart attack, and lung infections, such as pneumonia. Cataracts. This is when the lenses in the eyes become clouded. Digestive problems. This may include peptic ulcers, heartburn, and gastroesophageal reflux disease (GERD). Oral health problems, such as gum disease, mouth sores, and tooth loss. Loss of taste and smell. Smoking also affects how you look and smell. Smoking may cause: Wrinkles. Yellow or stained teeth, fingers, and fingernails. Bad breath. Bad-smelling clothes and hair. Smoking tobacco can also affect your social life, because: It may be challenging to find places to smoke when away from home. Many workplaces, restaurants, hotels, and public places are tobacco-free. Smoking is expensive. This is due to the cost of tobacco and the long-term costs of treating health problems from smoking. Secondhand smoke may affect those around you. Secondhand smoke can cause lung cancer, breathing problems, and heart disease. Children of smokers have a higher risk for: Sudden infant death syndrome (SIDS). Ear infections. Lung infections. What  actions can I take to prevent health problems? Quit smoking  Do not start smoking. Quit if you already smoke. Do not replace cigarette smoking with vaping devices, such as e-cigarettes. Make a plan to quit smoking and commit to it. Look for programs to help you, and ask your health care provider for recommendations and ideas. Set a date and write down all the reasons you want to quit. Let your friends and family know you are quitting so they can help and support you. Consider finding friends who also want to quit. It can be easier to quit with someone else, so that you can support each other. Talk with your health care provider about using nicotine replacement medicines to help you quit. These include gum, lozenges, patches, sprays, or pills. If you try to quit but return to smoking, stay positive. It is common to slip up when you first quit, so take it one day at a time. Be prepared for cravings. When you feel the urge to smoke, chew gum or suck on hard candy. Lifestyle Stay busy. Take care of your body. Get plenty of exercise, eat a healthy diet, and drink plenty of water. Find ways to manage your stress, such as meditation, yoga, exercise, or time spent with friends and family. Ask your health care provider about having regular tests (screenings) to check for cancer. This may include blood tests, imaging tests, and other tests. Where to find support To get support to quit smoking, consider: Asking your health care provider for more information and resources. Joining a support group for people who want to quit smoking in your local community. There are many effective programs that may help you to quit. Calling the smokefree.gov counselor   helpline at 1-800-QUIT-NOW (1-800-784-8669). Where to find more information You may find more information about quitting smoking from: Centers for Disease Control and Prevention: cdc.gov/tobacco Smokefree.gov: smokefree.gov American Lung Association:  freedomfromsmoking.org Contact a health care provider if: You have problems breathing. Your lips, nose, or fingers turn blue. You have chest pain. You are coughing up blood. You feel like you will faint. You have other health changes that cause you to worry. Summary Smoking tobacco can negatively affect your health, the health of those around you, your finances, and your social life. Do not start smoking. Quit if you already smoke. If you need help quitting, ask your health care provider. Consider joining a support group for people in your local community who want to quit smoking. There are many effective programs that may help you to quit. This information is not intended to replace advice given to you by your health care provider. Make sure you discuss any questions you have with your health care provider. Document Revised: 04/28/2021 Document Reviewed: 04/28/2021 Elsevier Patient Education  2023 Elsevier Inc.  

## 2021-10-14 NOTE — Progress Notes (Signed)
New Patient Office Visit  Subjective    Patient ID: Larry Floyd, male    DOB: 09-Oct-1960  Age: 61 y.o. MRN: [id number]  CC:  Chief Complaint  Patient presents with   New Patient (Initial Visit)    HPI Larry Floyd presents to establish care. He is here with his wife today. [facility] Reports that diabetes has been well controlled now. He does not check his blood sugars. He is taking metformin and [place]. He does have neuropathy. He is also on a statin. He eats a regular diet. He does have a history of COPD. He is currently only on albuterol. He reports daily shortness of breath and productive cough. He uses his albuterol inhaler 3x a day. He is a current smoker and is not interested in quitting. He reports a history of GERD that is well controlled with prilosec. He does check his blood pressure and home and reports blood pressure is usually 130s/80s. He takes HCTZ for his BP.   Denies chest pain, edema, orthopnea, dizziness, palpitations, or focal weakness.   He does reports chronic pain in his left knee. Denies injury. It is a constant ache. Activity makes pain worse. He has tried bracing, flexeril, tylenol, and advil without improvement. Denies fever, swelling, or erythema.   Outpatient Encounter Medications as of 10/14/2021  Medication Sig   albuterol (VENTOLIN HFA) 108 (90 Base) MCG/ACT inhaler Inhale 2 puffs into the lungs every 6 (six) hours as needed for wheezing or shortness of breath.   atorvastatin (LIPITOR) 10 MG tablet Take 1 tablet (10 mg total) by mouth daily.   cetirizine (ZYRTEC) 10 MG tablet Take 0.5 tablets (5 mg total) by mouth daily.   gabapentin (NEURONTIN) 600 MG tablet 1/2 po qam and 1 po qhs   hydrochlorothiazide (HYDRODIURIL) 25 MG tablet Take 1 tablet (25 mg total) by mouth daily.   metFORMIN (GLUCOPHAGE) 1000 MG tablet Take 1 tablet (1,000 mg total) by mouth 2 (two) times daily with a meal.   omeprazole (PRILOSEC)  20 MG capsule Take 1 capsule (20 mg total) by mouth daily.   sitaGLIPtin (JANUVIA) 100 MG tablet Take 1 tablet (100 mg total) by mouth daily.   nicotine (NICOTROL) 10 MG inhaler Inhale 1 continuous puffing into the lungs as needed for smoking cessation. (Patient not taking: Reported on 10/14/2021)   Omega-3 Fatty Acids (FISH OIL) 1000 MG CPDR Take 1 capsule by mouth in the morning and at bedtime. (Patient not taking: Reported on 10/14/2021)   [DISCONTINUED] cyclobenzaprine (FLEXERIL) 10 MG tablet Take 1 tablet (10 mg total) by mouth at bedtime as needed for muscle spasms.   [DISCONTINUED] fluticasone-salmeterol (ADVAIR) 100-50 MCG/ACT AEPB Inhale 1 puff into the lungs 2 (two) times daily. Rinse mouth after use   No facility-administered encounter medications on file as of 10/14/2021.    Past Medical History:  Diagnosis Date   Arthritis    Bronchitis    Cataract    Diabetes mellitus without complication (HCC)    Emphysema lung (HCC)    GERD (gastroesophageal reflux disease)    Hypercholesteremia    Hypertension     Past Surgical History:  Procedure Laterality Date   BACK SURGERY     Lumbar   EYE SURGERY Bilateral    implant insertion   HERNIA REPAIR     x2    Family History  Problem Relation Age of Onset   Stroke Mother    Hyperlipidemia Mother  Cancer Mother        throat cancer   Hypertension Mother    Atrial fibrillation Mother    Hypertension Father    Hyperlipidemia Father    COPD Father    Heart disease Father    AAA (abdominal aortic aneurysm) Father    Heart attack Father    Diabetes Sister    Heart murmur Sister    Hearing loss Daughter     Social History   Socioeconomic History   Marital status: Married    Spouse name: Mariann Laster   Number of children: 1   Years of education: 7   Highest education level: 7th grade  Occupational History   Not on file  Tobacco Use   Smoking status: Every Day    Packs/day: 3.00    Years: 45.00    Pack years: 135.00     Types: Cigarettes   Smokeless tobacco: Never  Vaping Use   Vaping Use: Never used  Substance and Sexual Activity   Alcohol use: Yes    Alcohol/week: 5.0 standard drinks    Types: 5 Shots of liquor per week    Comment: drinks "a couple shots" one night a week   Drug use: No   Sexual activity: Yes    Birth control/protection: None  Other Topics Concern   Not on file  Social History Narrative   Not on file   Social Determinants of Health   Financial Resource Strain: Not on file  Food Insecurity: Not on file  Transportation Needs: Not on file  Physical Activity: Not on file  Stress: Not on file  Social Connections: Not on file  Intimate Partner Violence: Not on file    ROS As per HPI.      Objective    BP 139/82   Pulse 74   Temp 98.1 F (36.7 C) (Temporal)   Ht 5' 7.75" (1.721 m)   Wt 147 lb 8 oz (66.9 kg)   SpO2 95%   BMI 22.59 kg/m   Physical Exam Vitals and nursing note reviewed.  Constitutional:      General: He is not in acute distress.    Appearance: He is not ill-appearing, toxic-appearing or diaphoretic.  HENT:     Head: Normocephalic and atraumatic.     Right Ear: Tympanic membrane, ear canal and external ear normal.     Left Ear: Tympanic membrane, ear canal and external ear normal.     Nose: Nose normal.     Mouth/Throat:     Mouth: Mucous membranes are moist.     Pharynx: Oropharynx is clear.  Eyes:     General:        Right eye: No discharge.        Left eye: No discharge.     Extraocular Movements: Extraocular movements intact.     Pupils: Pupils are equal, round, and reactive to light.  Neck:     Vascular: No carotid bruit.  Cardiovascular:     Rate and Rhythm: Normal rate and regular rhythm.     Heart sounds: Normal heart sounds. No murmur heard. Pulmonary:     Effort: Pulmonary effort is normal. No respiratory distress.     Breath sounds: No wheezing, rhonchi or rales.  Chest:     Chest wall: No tenderness.  Abdominal:      General: Bowel sounds are normal. There is no distension.     Palpations: Abdomen is soft.     Tenderness: There is no abdominal tenderness.  There is no guarding or rebound.  Musculoskeletal:     Cervical back: Neck supple.     Left knee: No swelling, deformity, effusion, erythema or bony tenderness. Tenderness (generalized) present. Normal alignment and normal patellar mobility.     Right lower leg: No edema.     Left lower leg: No edema.  Lymphadenopathy:     Cervical: No cervical adenopathy.  Skin:    General: Skin is warm.  Neurological:     General: No focal deficit present.     Mental Status: He is alert and oriented to person, place, and time.  Psychiatric:        Mood and Affect: Mood normal.        Behavior: Behavior normal.        Assessment & Plan:   Delorean was seen today for new patient (initial visit).  Diagnoses and all orders for this visit:  Controlled type 2 diabetes mellitus with hyperglycemia, without long-term current use of insulin ([place]) Well controlled today with a1c of 6.4. On statin. Labs pending. Eye exam scheduled. Not on ACE or ARB.  -     CBC with Differential/Platelet -     CMP14+EGFR -     Lipid panel -     Bayer DCA Hb A1c Waived  Hypertension associated with diabetes ([place]) Well controlled on current regimen. On HCTZ -     CBC with Differential/Platelet -     CMP14+EGFR -     Lipid panel  Hyperlipidemia associated with type 2 diabetes mellitus ([place]) On statin. Labs pending.  -     CBC with Differential/Platelet -     CMP14+EGFR -     Lipid panel  Chronic obstructive pulmonary disease, unspecified COPD type (HCC) Uncontrolled. Restart advair. Notify office if unable to afford.  -     fluticasone-salmeterol (ADVAIR) 100-50 MCG/ACT AEPB; Inhale 1 puff into the lungs 2 (two) times daily.  Gastroesophageal reflux disease without esophagitis Well controlled on current regimen. On prilosec.  -     CBC with Differential/Platelet -      CMP14+EGFR  Tobacco use disorder Tobacco abuse counseling Counseling of cessation options and health risks associated with tobacco use.  Chronic pain of left knee Discussed likely arthritis. Xray today, radiology report pending. Continue tylenol, brace, rest, stretching.  -     DG Knee 1-2 Views Left; Future  Screening for HIV (human immunodeficiency virus) -     HIV Antibody (routine testing w rflx)  Encounter for hepatitis C screening test for low risk patient -     Hepatitis C antibody  Colon cancer screening -     Cologuard  Need for vaccination -     Tdap vaccine greater than or equal to 7yo IM  Encounter to establish care  Return in about 3 months (around 01/14/2022) for chronic follow up.   The patient indicates understanding of these issues and agrees with the plan.  Gwenlyn Perking, FNP

## 2021-10-15 LAB — CBC WITH DIFFERENTIAL/PLATELET
Basophils Absolute: 0 10*3/uL (ref 0.0–0.2)
Basos: 0 %
EOS (ABSOLUTE): 0.3 10*3/uL (ref 0.0–0.4)
Eos: 2 %
Hematocrit: 42.5 % (ref 37.5–51.0)
Hemoglobin: 14.7 g/dL (ref 13.0–17.7)
Immature Grans (Abs): 0 10*3/uL (ref 0.0–0.1)
Immature Granulocytes: 0 %
Lymphocytes Absolute: 2.8 10*3/uL (ref 0.7–3.1)
Lymphs: 23 %
MCH: 29.3 pg (ref 26.6–33.0)
MCHC: 34.6 g/dL (ref 31.5–35.7)
MCV: 85 fL (ref 79–97)
Monocytes Absolute: 0.8 10*3/uL (ref 0.1–0.9)
Monocytes: 7 %
Neutrophils Absolute: 8.2 10*3/uL — ABNORMAL HIGH (ref 1.4–7.0)
Neutrophils: 68 %
Platelets: 360 10*3/uL (ref 150–450)
RBC: 5.02 x10E6/uL (ref 4.14–5.80)
RDW: 14 % (ref 11.6–15.4)
WBC: 12.2 10*3/uL — ABNORMAL HIGH (ref 3.4–10.8)

## 2021-10-15 LAB — LIPID PANEL
Chol/HDL Ratio: 4.4 ratio (ref 0.0–5.0)
Cholesterol, Total: 154 mg/dL (ref 100–199)
HDL: 35 mg/dL — ABNORMAL LOW (ref >39)
LDL Chol Calc (NIH): 89 mg/dL (ref 0–99)
Triglycerides: 173 mg/dL — ABNORMAL HIGH (ref 0–149)
VLDL Cholesterol Cal: 30 mg/dL (ref 5–40)

## 2021-10-15 LAB — CMP14+EGFR
ALT: 14 IU/L (ref 0–44)
AST: 15 IU/L (ref 0–40)
Albumin/Globulin Ratio: 1.5 (ref 1.2–2.2)
Albumin: 4.5 g/dL (ref 3.8–4.9)
Alkaline Phosphatase: 120 IU/L (ref 44–121)
BUN/Creatinine Ratio: 10 (ref 10–24)
BUN: 8 mg/dL (ref 8–27)
Bilirubin Total: 0.2 mg/dL (ref 0.0–1.2)
CO2: 25 mmol/L (ref 20–29)
Calcium: 9.7 mg/dL (ref 8.6–10.2)
Chloride: 95 mmol/L — ABNORMAL LOW (ref 96–106)
Creatinine, Ser: 0.79 mg/dL (ref 0.76–1.27)
Globulin, Total: 3.1 g/dL (ref 1.5–4.5)
Glucose: 96 mg/dL (ref 70–99)
Potassium: 4 mmol/L (ref 3.5–5.2)
Sodium: 135 mmol/L (ref 134–144)
Total Protein: 7.6 g/dL (ref 6.0–8.5)
eGFR: 102 mL/min/{1.73_m2} (ref >59)

## 2021-10-15 LAB — HEPATITIS C ANTIBODY: Hep C Virus Ab: NONREACTIVE

## 2021-10-15 LAB — HIV ANTIBODY (ROUTINE TESTING W REFLEX): HIV Screen 4th Generation wRfx: NONREACTIVE

## 2021-10-16 ENCOUNTER — Other Ambulatory Visit: Payer: Self-pay | Admitting: Family Medicine

## 2021-10-16 DIAGNOSIS — G8929 Other chronic pain: Secondary | ICD-10-CM

## 2021-10-16 MED ORDER — MELOXICAM 15 MG PO TABS: 15.0000 mg | ORAL_TABLET | Freq: Every day | ORAL | 1 refills | Status: DC

## 2021-10-20 DIAGNOSIS — Z1211 Encounter for screening for malignant neoplasm of colon: Secondary | ICD-10-CM | POA: Diagnosis not present

## 2021-10-22 LAB — HM DIABETES EYE EXAM

## 2021-10-27 ENCOUNTER — Ambulatory Visit (INDEPENDENT_AMBULATORY_CARE_PROVIDER_SITE_OTHER): Payer: 59 | Admitting: Family Medicine

## 2021-10-27 ENCOUNTER — Encounter: Payer: Self-pay | Admitting: Family Medicine

## 2021-10-27 VITALS — BP 114/71 | HR 77 | Temp 98.8°F | Ht 67.75 in | Wt 150.0 lb

## 2021-10-27 DIAGNOSIS — K047 Periapical abscess without sinus: Secondary | ICD-10-CM | POA: Diagnosis not present

## 2021-10-27 MED ORDER — AMOXICILLIN-POT CLAVULANATE 875-125 MG PO TABS: 1.0000 | ORAL_TABLET | Freq: Two times a day (BID) | ORAL | 0 refills | Status: AC

## 2021-10-27 NOTE — Progress Notes (Signed)
Subjective:  Patient ID: Larry Floyd, male    DOB: [birth year], 61 y.o.   MRN: [id number]  Patient Care Team: Gabriel Earing, FNP as PCP - General (Family Medicine)   Chief Complaint:  Facial Swelling (Right side/Affecting vision)   HPI: Larry Floyd is a 61 y.o. male presenting on 10/27/2021 for Facial Swelling (Right side/Affecting vision)   Pt presents today with complaints of right facial swelling which started this morning. States he does have some dental pain and cavities. He has not taken anything for these symptoms and has not made a dental appointment.   Dental Pain  This is a new problem. The current episode started today. The problem occurs constantly. The pain is moderate. Associated symptoms include facial pain and thermal sensitivity. Pertinent negatives include no difficulty swallowing, fever, oral bleeding or sinus pressure. He has tried nothing for the symptoms.     Relevant past medical, surgical, family, and social history reviewed and updated as indicated.  Allergies and medications reviewed and updated. Data reviewed: Chart in Epic.   Past Medical History:  Diagnosis Date   Arthritis    Bronchitis    Cataract    Diabetes mellitus without complication (HCC)    Emphysema lung (HCC)    GERD (gastroesophageal reflux disease)    Hypercholesteremia    Hypertension     Past Surgical History:  Procedure Laterality Date   BACK SURGERY     Lumbar   EYE SURGERY Bilateral    implant insertion   HERNIA REPAIR     x2    Social History   Socioeconomic History   Marital status: Married    Spouse name: Larry Floyd   Number of children: 1   Years of education: 7   Highest education level: 7th grade  Occupational History   Not on file  Tobacco Use   Smoking status: Every Day    Packs/day: 3.00    Years: 45.00    Total pack years: 135.00    Types: Cigarettes   Smokeless tobacco: Never  Vaping Use   Vaping Use: Never used  Substance and Sexual  Activity   Alcohol use: Yes    Alcohol/week: 5.0 standard drinks of alcohol    Types: 5 Shots of liquor per week    Comment: drinks "a couple shots" one night a week   Drug use: No   Sexual activity: Yes    Birth control/protection: None  Other Topics Concern   Not on file  Social History Narrative   Not on file   Social Determinants of Health   Financial Resource Strain: Not on file  Food Insecurity: Not on file  Transportation Needs: Not on file  Physical Activity: Not on file  Stress: Not on file  Social Connections: Not on file  Intimate Partner Violence: Not on file    Outpatient Encounter Medications as of 10/27/2021  Medication Sig   albuterol (VENTOLIN HFA) 108 (90 Base) MCG/ACT inhaler Inhale 2 puffs into the lungs every 6 (six) hours as needed for wheezing or shortness of breath.   amoxicillin-clavulanate (AUGMENTIN) 875-125 MG tablet Take 1 tablet by mouth 2 (two) times daily for 10 days.   atorvastatin (LIPITOR) 10 MG tablet Take 1 tablet (10 mg total) by mouth daily.   cetirizine (ZYRTEC) 10 MG tablet Take 0.5 tablets (5 mg total) by mouth daily.   fluticasone-salmeterol (ADVAIR) 100-50 MCG/ACT AEPB Inhale 1 puff into the lungs 2 (two) times daily.   gabapentin (  NEURONTIN) 600 MG tablet 1/2 po qam and 1 po qhs   hydrochlorothiazide (HYDRODIURIL) 25 MG tablet Take 1 tablet (25 mg total) by mouth daily.   meloxicam (MOBIC) 15 MG tablet Take 1 tablet (15 mg total) by mouth daily.   metFORMIN (GLUCOPHAGE) 1000 MG tablet Take 1 tablet (1,000 mg total) by mouth 2 (two) times daily with a meal.   nicotine (NICOTROL) 10 MG inhaler Inhale 1 continuous puffing into the lungs as needed for smoking cessation.   Omega-3 Fatty Acids (FISH OIL) 1000 MG CPDR Take 1 capsule by mouth in the morning and at bedtime.   omeprazole (PRILOSEC) 20 MG capsule Take 1 capsule (20 mg total) by mouth daily.   sitaGLIPtin (JANUVIA) 100 MG tablet Take 1 tablet (100 mg total) by mouth daily.   No  facility-administered encounter medications on file as of 10/27/2021.    No Known Allergies  Review of Systems  Constitutional:  Negative for activity change, appetite change, chills, diaphoresis, fatigue, fever and unexpected weight change.  HENT:  Positive for dental problem and facial swelling. Negative for congestion, drooling, ear discharge, ear pain, hearing loss, mouth sores, nosebleeds, postnasal drip, rhinorrhea, sinus pressure, sinus pain, sneezing, sore throat, tinnitus, trouble swallowing and voice change.   Respiratory:  Negative for cough and shortness of breath.   Cardiovascular:  Negative for chest pain, palpitations and leg swelling.  Gastrointestinal:  Negative for abdominal pain.  Genitourinary:  Negative for decreased urine volume and difficulty urinating.  Neurological:  Negative for weakness and headaches.  Psychiatric/Behavioral:  Negative for confusion.   All other systems reviewed and are negative.       Objective:  BP 114/71   Pulse 77   Temp 98.8 F (37.1 C)   Ht 5' 7.75" (1.721 m)   Wt 150 lb (68 kg)   SpO2 93%   BMI 22.98 kg/m    Wt Readings from Last 3 Encounters:  10/27/21 150 lb (68 kg)  10/14/21 147 lb 8 oz (66.9 kg)  08/11/21 143 lb (64.9 kg)    Physical Exam Vitals and nursing note reviewed.  Constitutional:      General: He is not in acute distress.    Appearance: Normal appearance. He is normal weight. He is not ill-appearing, toxic-appearing or diaphoretic.  HENT:     Head: Normocephalic and atraumatic.     Comments: Right cheek swelling    Right Ear: Tympanic membrane, ear canal and external ear normal.     Left Ear: Tympanic membrane, ear canal and external ear normal.     Nose: Nose normal.     Mouth/Throat:     Lips: Pink.     Mouth: Mucous membranes are moist.     Dentition: Abnormal dentition. Does not have dentures. Dental tenderness, gingival swelling, dental caries and dental abscesses present. No gum lesions.      Pharynx: Oropharynx is clear. No oropharyngeal exudate or posterior oropharyngeal erythema.     Comments: Several caries noted with gingival swelling to right upper gum. Several missing teeth.  Eyes:     Conjunctiva/sclera: Conjunctivae normal.     Pupils: Pupils are equal, round, and reactive to light.  Cardiovascular:     Rate and Rhythm: Normal rate and regular rhythm.     Heart sounds: Normal heart sounds. No murmur heard.    No friction rub. No gallop.  Pulmonary:     Effort: Pulmonary effort is normal.     Breath sounds: Normal breath sounds.  Musculoskeletal:     Cervical back: Normal range of motion and neck supple.     Right lower leg: No edema.     Left lower leg: No edema.  Lymphadenopathy:     Cervical: No cervical adenopathy.  Skin:    General: Skin is warm and dry.     Capillary Refill: Capillary refill takes less than 2 seconds.  Neurological:     General: No focal deficit present.     Mental Status: He is alert and oriented to person, place, and time.  Psychiatric:        Mood and Affect: Mood normal.        Behavior: Behavior normal.        Thought Content: Thought content normal.        Judgment: Judgment normal.     Results for orders placed or performed in visit on 10/27/21  HM DIABETES EYE EXAM  Result Value Ref Range   HM Diabetic Eye Exam No Retinopathy No Retinopathy       Pertinent labs & imaging results that were available during my care of the patient were reviewed by me and considered in my medical decision making.  Assessment & Plan:  Larry Floyd was seen today for facial swelling.  Diagnoses and all orders for this visit:  Dental abscess Pt aware he needs to make an appointment to see a dentist as soon as possible. Will start Augmentin as prescribed. Pt aware of red flags which require emergent evaluation. Can take tylenol as needed for pain. Warm compresses to help with swelling.  -     amoxicillin-clavulanate (AUGMENTIN) 875-125 MG tablet;  Take 1 tablet by mouth 2 (two) times daily for 10 days.     Continue all other maintenance medications.  Follow up plan: Return if symptoms worsen or fail to improve.   Continue healthy lifestyle choices, including diet (rich in fruits, vegetables, and lean proteins, and low in salt and simple carbohydrates) and exercise (at least 30 minutes of moderate physical activity daily).  Educational handout given for dental abscess  The above assessment and management plan was discussed with the patient. The patient verbalized understanding of and has agreed to the management plan. Patient is aware to call the clinic if they develop any new symptoms or if symptoms persist or worsen. Patient is aware when to return to the clinic for a follow-up visit. Patient educated on when it is appropriate to go to the emergency department.   Kari BaarsMichelle Ashaunte Standley, FNP-C [facility] [phone]

## 2021-10-29 ENCOUNTER — Other Ambulatory Visit: Payer: Self-pay | Admitting: Physician Assistant

## 2021-10-29 LAB — COLOGUARD: COLOGUARD: POSITIVE — AB

## 2021-11-04 ENCOUNTER — Other Ambulatory Visit: Payer: Self-pay | Admitting: Family Medicine

## 2021-11-04 DIAGNOSIS — R195 Other fecal abnormalities: Secondary | ICD-10-CM

## 2021-11-05 ENCOUNTER — Encounter: Payer: Self-pay | Admitting: Family Medicine

## 2021-11-05 ENCOUNTER — Ambulatory Visit (INDEPENDENT_AMBULATORY_CARE_PROVIDER_SITE_OTHER): Payer: 59 | Admitting: Family Medicine

## 2021-11-05 VITALS — BP 130/74 | HR 74 | Temp 98.1°F | Ht 67.75 in | Wt 149.5 lb

## 2021-11-05 DIAGNOSIS — R9431 Abnormal electrocardiogram [ECG] [EKG]: Secondary | ICD-10-CM | POA: Diagnosis not present

## 2021-11-05 DIAGNOSIS — I10 Essential (primary) hypertension: Secondary | ICD-10-CM

## 2021-11-05 DIAGNOSIS — R079 Chest pain, unspecified: Secondary | ICD-10-CM | POA: Diagnosis not present

## 2021-11-05 DIAGNOSIS — J449 Chronic obstructive pulmonary disease, unspecified: Secondary | ICD-10-CM | POA: Diagnosis not present

## 2021-11-05 DIAGNOSIS — E1165 Type 2 diabetes mellitus with hyperglycemia: Secondary | ICD-10-CM | POA: Diagnosis not present

## 2021-11-05 MED ORDER — AMLODIPINE BESYLATE 2.5 MG PO TABS: 2.5000 mg | ORAL_TABLET | Freq: Every day | ORAL | 1 refills | Status: DC

## 2021-11-05 MED ORDER — TRELEGY ELLIPTA 100-62.5-25 MCG/ACT IN AEPB: 1.0000 | INHALATION_SPRAY | Freq: Every day | RESPIRATORY_TRACT | 3 refills | Status: DC

## 2021-11-05 MED ORDER — SITAGLIPTIN PHOSPHATE 100 MG PO TABS: 100.0000 mg | ORAL_TABLET | Freq: Every day | ORAL | 1 refills | Status: DC

## 2021-11-05 NOTE — Patient Instructions (Addendum)
Call [phone] to schedule colonoscopy  Angina  Angina is discomfort in the chest, neck, arm, jaw, or back. The discomfort is caused by a lack of blood in the middle layer of the heart wall (myocardium). There are four types of angina: Stable angina. This is triggered by vigorous activity or exercise. It goes away when you rest or take medicines that treat angina. This is diagnosed if you have had the symptom for more than 2 months. Unstable angina. This is a warning sign and can lead to a heart attack. This is a medical emergency. Symptoms come at rest and last a long time. Microvascular angina. This affects the small coronary arteries. Symptoms include chest pain, feeling tired, and being short of breath. The symptoms can last a long time or short time. Prinzmetal or variant angina. This is caused by a spasm of the arteries that go to your heart. What are the causes? This condition is usually caused by atherosclerosis. This is the buildup of fat and cholesterol (plaque) in your arteries. The plaque may narrow or block the artery. Other causes of angina include: Sudden spasms of the muscles of the arteries in the heart. Small artery disease (microvascular dysfunction). Problems with any of your heart valves. A tear in an artery in your heart (coronary artery dissection). Weakness of the heart muscle (cardiomyopathy). What increases the risk? You are more likely to develop this condition if you have: High cholesterol. High blood pressure. Diabetes. A family history of heart disease. A sedentary lifestyle, or a lifestyle in which you do not exercise enough. Depression. Had radiation treatment to the left side of your chest. Other risk factors include: Using tobacco. Being obese. Eating a diet high in saturated fats. Being exposed to high stress or triggers of stress. Using drugs, such as cocaine. Women have a greater risk for angina if they: Are older than age 13. Have gone  through menopause. What are the signs or symptoms? Common symptoms of this condition in both men and women may include: Chest pain, which may: Feel like a crushing or squeezing in the chest, or a tightness, pressure, fullness, or heaviness in the chest. Last for more than a few minutes, or stop and come back over a few minutes. Pain in the neck, arm, jaw, or back. Unexplained heartburn or indigestion. Shortness of breath. Nausea. Sudden cold sweats. Women and people with diabetes may have unusual (atypical) symptoms, such as: Fatigue. Unexplained feelings of nervousness or anxiety. Unexplained weakness. Dizziness or fainting. How is this diagnosed? This condition may be diagnosed based on: Your symptoms and medical history. Electrocardiogram (ECG) to measure the electrical activity in your heart. Blood tests. Stress test to look for signs of blockage when your heart is stressed. CT angiogram to examine your heart and the blood flow to it. Coronary angiogram to check for arterial blockage. Echocardiogram (ultrasound) to assess the strength of your heartbeat. How is this treated? Angina may be treated with: Medicines to: Prevent blood clots and heart attack. Relax blood vessels and improve blood flow to the heart. Reduce blood pressure, improve heart pumping, and relax blood vessels spasms. Reduce cholesterol and help treat atherosclerosis. A procedure to widen a narrowed or blocked coronary artery (angioplasty). A mesh tube (stent) may be placed in a coronary artery to keep it open. Surgery to allow blood to go around a blocked artery (coronary artery bypass surgery). Follow these instructions at home: Medicines Take over-the-counter and prescription medicines only as told by your health care  provider. Do not take the following medicines unless your health care provider approves: NSAIDs, such as ibuprofen or naproxen. Vitamin supplements that contain vitamin A, vitamin E, or  both. Hormone replacement therapy that contains estrogen with or without progestin. Eating and drinking  Eat a heart-healthy diet. This includes plenty of fresh fruits and vegetables, whole grains, low-fat (lean) protein, and low-fat dairy products. Follow instructions from your health care provider about eating or drinking restrictions. Activity Follow an exercise program approved by your health care provider. Consider joining a cardiac rehabilitation program. Take a break when you feel fatigued. Plan rest periods in your daily activities. Lifestyle  Do not use any products that contain nicotine or tobacco. These products include cigarettes, chewing tobacco, and vaping devices, such as e-cigarettes. If you need help quitting, ask your health care provider. If your health care provider says you can drink alcohol: Limit how much you have to: 0-1 drink a day for women who are not pregnant. 0-2 drinks a day for men. Be aware of how much alcohol is in your drink. In the [place] one drink equals one 12 oz bottle of beer (355 mL), one 5 oz glass of wine (148 mL), or one 1 oz glass of hard liquor (44 mL). General instructions Maintain a healthy weight. Learn to manage stress. Keep your vaccinations up to date. Get the flu (influenza) vaccine every year. Talk to your health care provider if you feel depressed. Take a depression screening test to see if you are at risk for depression. Work with your health care provider to manage other health conditions, such as hypertension or diabetes. Keep all follow-up visits. This is important. Get help right away if: You have pain in your chest, neck, arm, jaw, or back, and the pain: Lasts more than a few minutes. Is recurring. Is not relieved by taking medicines under the tongue (sublingual nitroglycerin). Increases in intensity or frequency. You have a lot of sweating without cause. You have unexplained: Heartburn or indigestion. Shortness of breath  or difficulty breathing. Nausea or vomiting. Fatigue. Feelings of nervousness or anxiety. Weakness. You have sudden light-headedness or dizziness. You faint. These symptoms may represent a serious problem that is an emergency. Do not wait to see if the symptoms will go away. Get medical help right away. Call your local emergency services (911 in the [place] Do not drive yourself to the hospital. Summary Angina is discomfort in the chest, neck, arm, jaw, or back that is caused by a lack of blood in the arteries of the heart wall. There are many symptoms of angina. They include chest pain, unexplained heartburn or indigestion, sudden cold sweats, and fatigue. Angina may be treated with lifestyle changes, medicines, or surgery. Symptoms of angina may represent an emergency. Get medical help right away. Call your local emergency services (911 in the [place] Do not drive yourself to the hospital. This information is not intended to replace advice given to you by your health care provider. Make sure you discuss any questions you have with your health care provider. Document Revised: 10/26/2019 Document Reviewed: 10/26/2019 Elsevier Patient Education  2023 [employer].

## 2021-11-11 ENCOUNTER — Encounter (INDEPENDENT_AMBULATORY_CARE_PROVIDER_SITE_OTHER): Payer: Self-pay | Admitting: *Deleted

## 2021-11-16 ENCOUNTER — Encounter: Payer: Self-pay | Admitting: Cardiology

## 2021-11-16 ENCOUNTER — Ambulatory Visit (INDEPENDENT_AMBULATORY_CARE_PROVIDER_SITE_OTHER): Payer: 59 | Admitting: Cardiology

## 2021-11-16 ENCOUNTER — Telehealth: Payer: Self-pay | Admitting: *Deleted

## 2021-11-16 VITALS — BP 140/78 | HR 76 | Ht 67.0 in | Wt 153.2 lb

## 2021-11-16 DIAGNOSIS — R0789 Other chest pain: Secondary | ICD-10-CM | POA: Insufficient documentation

## 2021-11-16 DIAGNOSIS — F17200 Nicotine dependence, unspecified, uncomplicated: Secondary | ICD-10-CM

## 2021-11-16 DIAGNOSIS — I10 Essential (primary) hypertension: Secondary | ICD-10-CM

## 2021-11-16 DIAGNOSIS — I200 Unstable angina: Secondary | ICD-10-CM | POA: Diagnosis not present

## 2021-11-16 DIAGNOSIS — E7849 Other hyperlipidemia: Secondary | ICD-10-CM

## 2021-11-16 DIAGNOSIS — E119 Type 2 diabetes mellitus without complications: Secondary | ICD-10-CM | POA: Diagnosis not present

## 2021-11-16 DIAGNOSIS — R69 Illness, unspecified: Secondary | ICD-10-CM | POA: Diagnosis not present

## 2021-11-16 MED ORDER — ROSUVASTATIN CALCIUM 40 MG PO TABS: 40.0000 mg | ORAL_TABLET | Freq: Every day | ORAL | 3 refills | Status: DC

## 2021-11-16 MED ORDER — ATORVASTATIN CALCIUM 40 MG PO TABS: 40.0000 mg | ORAL_TABLET | Freq: Every day | ORAL | 3 refills | Status: DC

## 2021-11-16 MED ORDER — ISOSORBIDE MONONITRATE ER 30 MG PO TB24: 30.0000 mg | ORAL_TABLET | Freq: Every day | ORAL | 6 refills | Status: DC

## 2021-11-16 MED ORDER — BISOPROLOL FUMARATE 5 MG PO TABS: 5.0000 mg | ORAL_TABLET | Freq: Every day | ORAL | 6 refills | Status: DC

## 2021-11-16 NOTE — Telephone Encounter (Signed)
Called  to inform patient he - will need labs for  upcoming cath   CBC, BMP   Can do it  anytime this week  before November 23, 2021.

## 2021-11-16 NOTE — H&P (View-Only) (Signed)
  Primary Care Provider: Morgan, Tiffany M, FNP [facility] Cardiologist: Ellie Spickler, MD Electrophysiologist: None  Clinic Note: No chief complaint on file.   ===================================  ASSESSMENT/PLAN   Problem List Items Addressed This Visit   None   ===================================  HPI:    Larry Floyd is a 60 y.o. male who is being seen today for the evaluation of *** at the request of Morgan, Tiffany M, FNP.  Moshe L Knoble was last seen on ***  Recent Hospitalizations: ***  Reviewed  CV studies:    The following studies were reviewed today: (if available, images/films reviewed: From Epic Chart or Care Everywhere) ***:   Interval History:   Larry Floyd   CV Review of Symptoms (Summary) Cardiovascular ROS: {[id number]}  REVIEWED OF SYSTEMS   ROS  I have reviewed and (if needed) personally updated the patient's problem list, medications, allergies, past medical and surgical history, social and family history.   PAST MEDICAL HISTORY   Past Medical History:  Diagnosis Date   Arthritis    Bronchitis    Cataract    Diabetes mellitus without complication (HCC)    Emphysema lung (HCC)    GERD (gastroesophageal reflux disease)    Hypercholesteremia    Hypertension     PAST SURGICAL HISTORY   Past Surgical History:  Procedure Laterality Date   BACK SURGERY     Lumbar   EYE SURGERY Bilateral    implant insertion   HERNIA REPAIR     x2    Immunization History  Administered Date(s) Administered   Janssen (J&J) SARS-COV-2 Vaccination 10/09/2019   Tdap 10/14/2021    MEDICATIONS/ALLERGIES   Current Meds  Medication Sig   albuterol (VENTOLIN HFA) 108 (90 Base) MCG/ACT inhaler Inhale 2 puffs into the lungs every 6 (six) hours as needed for wheezing or shortness of breath.   amLODipine (NORVASC) 2.5 MG tablet Take 1 tablet (2.5 mg total) by mouth daily.   atorvastatin (LIPITOR) 10 MG tablet Take 1 tablet (10 mg  total) by mouth daily.   cetirizine (ZYRTEC) 10 MG tablet Take 0.5 tablets (5 mg total) by mouth daily.   fluticasone-salmeterol (ADVAIR) 100-50 MCG/ACT AEPB Inhale 1 puff into the lungs 2 (two) times daily.   Fluticasone-Umeclidin-Vilant (TRELEGY ELLIPTA) 100-62.5-25 MCG/ACT AEPB Inhale 1 Inhalation into the lungs daily.   gabapentin (NEURONTIN) 600 MG tablet 1/2 po qam and 1 po qhs   hydrochlorothiazide (HYDRODIURIL) 25 MG tablet Take 1 tablet (25 mg total) by mouth daily.   meloxicam (MOBIC) 15 MG tablet Take 1 tablet (15 mg total) by mouth daily.   metFORMIN (GLUCOPHAGE) 1000 MG tablet Take 1 tablet (1,000 mg total) by mouth 2 (two) times daily with a meal.   nicotine (NICOTROL) 10 MG inhaler Inhale 1 continuous puffing into the lungs as needed for smoking cessation.   omeprazole (PRILOSEC) 20 MG capsule Take 1 capsule (20 mg total) by mouth daily.   sitaGLIPtin (JANUVIA) 100 MG tablet Take 1 tablet (100 mg total) by mouth daily.    No Known Allergies  SOCIAL HISTORY/FAMILY HISTORY   Reviewed in Epic:   Social History   Tobacco Use   Smoking status: Every Day    Packs/day: 3.00    Years: 45.00    Total pack years: 135.00    Types: Cigarettes   Smokeless tobacco: Never  Vaping Use   Vaping Use: Never used  Substance Use Topics   Alcohol use: Yes    Alcohol/week: 5.0 standard drinks   his potential claudication symptoms, we will shoot for LDL less than 70.  Most recent studies showed LDL 89.  Plan: Increase dose and change potency to rosuvastatin 40 mg daily.      Relevant Medications   isosorbide mononitrate (IMDUR) 30 MG 24 hr tablet   bisoprolol (ZEBETA) 5 MG tablet   aspirin EC 81 MG tablet   atorvastatin (LIPITOR) 40 MG tablet   Other Relevant Orders   CBC   Basic metabolic panel     Other   Controlled type 2 diabetes mellitus without complication, without long-term current use of  insulin (HCC) (Chronic)    So far stable with no clear evidence of CAD and PAD, but symptoms would suggest that he does. A1c 6.7.  He is on metformin and Trelegy with borderline control.  Suspect that he needs more glycemic control and therefore probably need titration of medications per PCP.      Relevant Medications   aspirin EC 81 MG tablet   atorvastatin (LIPITOR) 40 MG tablet   Other Relevant Orders   EKG 12-Lead   CBC   Basic metabolic panel   Tobacco use disorder (Chronic)    Smoking cessation instruction/counseling given:  counseled patient on the dangers of tobacco use, advised patient to stop smoking, and reviewed strategies to maximize success => hopefully catheterization, if he does indicate significant disease will serve as a deterrent for smoking and help with smoking cessation counseling.       Relevant Orders   EKG 12-Lead   CBC   Basic metabolic panel    ===================================  HPI:    Larry Floyd is a 61 y.o. male long-term heavy smoker with COPD (Chronic Bronchitis), DM-2, HTN and HLD who is being seen today for the evaluation of CHEST PAIN at the request of Gwenlyn Perking, FNP.  Kalix Schey Irani was recently seen on June 22 by Marjorie Smolder, FNP for evaluation of chest pain associate with dizziness and dyspnea.  He also noted some numbness in his left arm with clamminess and dyspnea.  Initially contacted EMS and an EKG was read as abnormal but he decided not go to the ER for evaluation.  He actually said about a year before that he had a similar episode that was maybe worse.  Did not get evaluated on that as well.  He was therefore referred for cardiology evaluation.  Recent Hospitalizations: None  Reviewed  CV studies:    The following studies were reviewed today: (if available, images/films reviewed: From Epic Chart or Care Everywhere) None:  Interval History:   Larry Floyd presents here today for evaluation of this episode of  chest pain.  Basically what happened is on 21 June he had a little bit of issue with some discomfort in his chest and headache.  It then started off as a first the sharp jabbing type pain and then became a generalized pressure type to have discomfort in his chest that caused him to feel short of breath.  He got somewhat clammy headed and dizzy woozy with dyspnea.  He took 4 baby aspirin and felt a little bit better.  He did not mention to me that he contacted EMS but told his PCP.  He went to see his PCP the next day.  Was chest pain-free upon that visit, but did note that since the episode he has been having more prominent dyspnea more with exertion but no further chest pain.  He just feels tired and much more  worn out.  He says that his heart rate goes up a lot faster than it used to with minimal activity now.  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  He got short of breath and similar similar symptoms that he had here with some pain radiating to numbness in his left arm.  He never went to be evaluated for that episode.  He is not currently having any PND, orthopnea or edema but does have some exertional leg discomfort that he mentions. Other than noticing his heart rate going up fast with exercise, he has not had any palpitations or irregular heartbeats.  No syncope or near syncope although he felt like he may pass out when he was having the bad chest pain.  That made him feel like he was going to pass out.  CV Review of Symptoms (Summary) Cardiovascular ROS: positive for - chest pain, dyspnea on exertion, palpitations, shortness of breath, and sensation of rapid heart rate with minimal exertion associate with dyspnea.  Possible claudication negative for - edema, irregular heartbeat, orthopnea, palpitations, paroxysmal nocturnal dyspnea, shortness of  breath, or syncope/near syncope, although he may get lightheaded.  No syncope/near syncope or TIA/amaurosis fugax  REVIEWED OF SYSTEMS   Review of Systems  Constitutional:  Negative for chills and malaise/fatigue.  HENT:  Negative for congestion and nosebleeds.   Respiratory:  Positive for cough, shortness of breath and wheezing. Negative for sputum production.        Chronic COPD related cough and dyspnea  Cardiovascular:        Per HPI  Gastrointestinal:  Positive for nausea (He felt nauseated with the chest pain but none since). Negative for blood in stool and melena.  Genitourinary:  Negative for hematuria.  Musculoskeletal:  Negative for falls, joint pain and myalgias.  Neurological:  Negative for dizziness, focal weakness and weakness.  Psychiatric/Behavioral:  Negative for depression and memory loss. The patient is not nervous/anxious and does not have insomnia.   All other systems reviewed and are negative.  I have reviewed and (if needed) personally updated the patient's problem list, medications, allergies, past medical and surgical history, social and family history.   PAST MEDICAL HISTORY   Past Medical History:  Diagnosis Date   Arthritis    Bronchitis    Cataract    Diabetes mellitus without complication (HCC)    Emphysema lung (HCC)    GERD (gastroesophageal reflux disease)    Hypercholesteremia    Hypertension     PAST SURGICAL HISTORY   Past Surgical History:  Procedure Laterality Date   BACK SURGERY     Lumbar   EYE SURGERY Bilateral    implant insertion   HERNIA REPAIR     x2    Immunization History  Administered Date(s) Administered   Janssen (J&J) SARS-COV-2 Vaccination 10/09/2019   Tdap 10/14/2021    MEDICATIONS/ALLERGIES   Current Meds  Medication Sig   albuterol (VENTOLIN HFA) 108 (90 Base) MCG/ACT inhaler Inhale 2 puffs into the lungs every 6 (six) hours as needed for wheezing or shortness of breath.   amLODipine (NORVASC) 2.5 MG  tablet Take 1 tablet (2.5 mg total) by mouth daily.   aspirin EC 81 MG tablet Take 81 mg by mouth daily. Swallow whole.   atorvastatin (LIPITOR) 100 MG tablet Take 1 tablet (10  mg total) by mouth daily.   cetirizine (ZYRTEC) 10 MG tablet Take 0.5 tablets (5 mg total) by mouth daily.   fluticasone-salmeterol (ADVAIR) 100-50 MCG/ACT AEPB Inhale 1 puff into the lungs 2 (two) times daily.   Fluticasone-Umeclidin-Vilant (TRELEGY ELLIPTA) 100-62.5-25 MCG/ACT AEPB Inhale 1 Inhalation into the lungs daily.   gabapentin (NEURONTIN) 600 MG tablet 1/2 po qam and 1 po qhs   hydrochlorothiazide (HYDRODIURIL) 25 MG tablet Take 1 tablet (25 mg total) by mouth daily.   meloxicam (MOBIC) 15 MG tablet Take 1 tablet (15 mg total) by mouth daily.   metFORMIN (GLUCOPHAGE) 1000 MG tablet Take 1 tablet (1,000 mg total) by mouth 2 (two) times daily with a meal.   nicotine (NICOTROL) 10 MG inhaler Inhale 1 continuous puffing into the lungs as needed for smoking cessation.   omeprazole (PRILOSEC) 20 MG capsule Take 1 capsule (20 mg total) by mouth daily.   sitaGLIPtin (JANUVIA) 100 MG tablet Take 1 tablet (100 mg total) by mouth daily.   []  atorvastatin (LIPITOR) 10 MG tablet Take 1 tablet (10 mg total) by mouth daily.    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   Still smokes.   Family History  Problem Relation Age of Onset   Stroke Mother    Hyperlipidemia Mother     Cancer Mother        throat cancer   Hypertension Mother    Atrial fibrillation Mother    Hypertension Father    Hyperlipidemia Father    COPD Father    Heart disease Father    AAA (abdominal aortic aneurysm) Father    Heart attack Father    Diabetes Sister    Heart murmur Sister    Hearing loss Daughter     OBJCTIVE -PE, EKG, labs   Wt Readings from Last 3 Encounters:  11/16/21 153 lb 3.2 oz (69.5 kg)  11/05/21 149 lb 8 oz (67.8 kg)  10/27/21 150 lb (68 kg)    Physical Exam: BP 140/78   Pulse 76   Ht 5\' 7"  (1.702 m)   Wt 153 lb 3.2 oz (69.5 kg)   SpO2 95%   BMI 23.99 kg/m  Physical Exam Vitals reviewed.  Constitutional:      General: He is not in acute distress.    Appearance: Normal appearance. He is normal weight. He is not ill-appearing or toxic-appearing.  HENT:     Head: Normocephalic and atraumatic.  Neck:     Vascular: No carotid bruit or JVD.  Cardiovascular:     Rate and Rhythm: Normal rate and regular rhythm. No extrasystoles are present.    Chest Wall: PMI is not displaced.     Pulses: Decreased pulses (Mildly decreased pedal pulses.).     Heart sounds: S1 normal and S2 normal.  Heart sounds are distant. No murmur heard.    No gallop.  Pulmonary:     Effort: Pulmonary effort is normal. No respiratory distress.     Breath sounds: Wheezing present. No rhonchi or rales.     Comments: Diffuse interstitial sounds Chest:     Chest wall: No tenderness.  Abdominal:     General: Abdomen is flat. Bowel sounds are normal. There is no distension.     Palpations: Abdomen is soft. There is no mass.     Tenderness: There is no abdominal tenderness. There is no rebound.     Hernia: No hernia is present.  Musculoskeletal:        General: No swelling. Normal range of motion.     Cervical back: Normal range of motion and neck supple.  Skin:    General: Skin is warm and dry.  Neurological:     General: No focal deficit present.     Mental Status: He is alert  and oriented to person, place, and time. Mental status is at baseline.     Cranial Nerves: No cranial nerve deficit.     Gait: Gait normal.  Psychiatric:        Mood and Affect: Mood normal.        Behavior: Behavior normal.        Thought Content: Thought content normal.        Judgment: Judgment normal.     Adult ECG Report  Rate: 76 ;  Rhythm: normal sinus rhythm and IRBBB, cannot exclude SMI, age-indeterminate. ;   Narrative Interpretation: No necessary change from 2017 EKG  Recent Labs: Reviewed PCP Lab Results  Component Value Date   CHOL 154 10/14/2021   HDL 35 (L) 10/14/2021   LDLCALC 89 10/14/2021   TRIG 173 (H) 10/14/2021   CHOLHDL 4.4 10/14/2021   Lab Results  Component Value Date   CREATININE 0.79 10/14/2021   BUN 8 10/14/2021   NA 135 10/14/2021   K 4.0 10/14/2021   CL 95 (L) 10/14/2021   CO2 25 10/14/2021      Latest Ref Rng & Units 10/14/2021    2:40 PM 07/18/2014    5:15 PM 08/30/2007    2:21 PM  CBC  WBC 3.4 - 10.8 x10E3/uL 12.2  11.8    Hemoglobin 13.0 - 17.7 g/dL 14.7  15.4  15.5   Hematocrit 37.5 - 51.0 % 42.5  45.2  43.8   Platelets 150 - 450 x10E3/uL 360  319      Lab Results  Component Value Date   HGBA1C 6.7 (H) 10/14/2021   No results found for: "TSH"  ==================================================  COVID-19 Education: The signs and symptoms of COVID-19 were discussed with the patient and how to seek care for testing (follow up with PCP or arrange E-visit).    I spent a total of 35 minutes with the patient spent in direct patient consultation.  Additional time spent with chart review  / charting (studies, outside notes, etc): 16 min Total Time: 51 min  Current medicines are reviewed at length with the patient today.  (+/- concerns) none  This visit occurred during the SARS-CoV-2 public health emergency.  Safety protocols were in place, including screening questions prior to the visit, additional usage of staff PPE, and extensive  cleaning of exam room while observing appropriate contact time as indicated for disinfecting solutions.  Notice: This dictation was prepared with Dragon dictation along with smart phrase technology. Any transcriptional errors that result from  this process are unintentional and may not be corrected upon review.   Studies Ordered:  Orders Placed This Encounter  Procedures   CBC   Basic metabolic panel   EKG 12-Lead   Meds ordered this encounter  Medications   isosorbide mononitrate (IMDUR) 30 MG 24 hr tablet    Sig: Take 1 tablet (30 mg total) by mouth at bedtime.    Dispense:  30 tablet    Refill:  6   bisoprolol (ZEBETA) 5 MG tablet    Sig: Take 1 tablet (5 mg total) by mouth daily.    Dispense:  30 tablet    Refill:  6   DISCONTD: rosuvastatin (CRESTOR) 40 MG tablet    Sig: Take 1 tablet (40 mg total) by mouth daily.    Dispense:  90 tablet    Refill:  3   atorvastatin (LIPITOR) 40 MG tablet    Sig: Take 1 tablet (40 mg total) by mouth daily.    Dispense:  90 tablet    Refill:  3    Increase dose to 40 mg , disregard rx for 40 mg rosuvastatin previously sent    Patient Instructions / Medication Changes & Studies & Tests Ordered   Patient Instructions  Medication Instructions:      Start Imdur ( isosorbide Mononitrate ) 30 mg  daily  bedtime  Start Bisoprolol 5 mg one tablet daily         Increase taking Atorvastatin  to 40 mg daily at bedtime  *If you need a refill on your cardiac medications before your next appointment, please call your pharmacy*   Lab Work: No labs needed  Testing/Procedures: Will be schedule at [facility] November 25, 2021  - [address]  Your physician has requested that you have a cardiac catheterization. Cardiac catheterization is used to diagnose and/or treat various heart conditions. Doctors may recommend this procedure for a number of different reasons. The most common reason is to evaluate chest pain. Chest pain can  be a symptom of coronary artery disease (CAD), and cardiac catheterization can show whether plaque is narrowing or blocking your heart's arteries. This procedure is also used to evaluate the valves, as well as measure the blood flow and oxygen levels in different parts of your heart. For further information please visit [email or web address] Please follow instruction sheet, as given.    Follow-Up: At [facility], you and your health needs are our priority.  As part of our continuing mission to provide you with exceptional heart care, we have created designated Provider Care Teams.  These Care Teams include your primary Cardiologist (physician) and Advanced Practice Providers (APPs -  Physician Assistants and Nurse Practitioners) who all work together to provide you with the care you need, when you need it.  We recommend signing up for the patient portal called "MyChart".  Sign up information is provided on this After Visit Summary.  MyChart is used to connect with patients for Virtual Visits (Telemedicine).  Patients are able to view lab/test results, encounter notes, upcoming appointments, etc.  Non-urgent messages can be sent to your provider as well.   To learn more about what you can do with MyChart, go to [email or web address].    Your next appointment:   3 week(s)  The format for your next appointment:   In Person  Provider:   Bryan Lemma, MD     Other Instructions  [facility]  [address] [place] [address] Dept: [phone] Loc: [phone]  ERRIN STOKES  11/16/2021  You are scheduled for a Cardiac Catheterization on Wednesday, July 12 with Dr. Glenetta Hew.  1. Please arrive at the Main Entrance A at [facility]: [place], [place] [address] at 6:30 AM (This time is two hours before your procedure to ensure your preparation). Free valet parking service is  available.   Special note: Every effort is made to have your procedure done on time. Please understand that emergencies sometimes delay scheduled procedures.  2. Diet: Do not eat solid foods after midnight.  You may have clear liquids until 5 AM upon the day of the procedure.  3. Labs: not needed  4. Medication instructions in preparation for your procedure:   Contrast Allergy: No  Do not take Diabetes Med Glucophage (Metformin) on the day of the procedure and HOLD 48 HOURS AFTER THE PROCEDURE.  On the morning of your procedure, take Aspirin  81 mg and any morning medicines NOT listed above.  You may use sips of water.  5. Plan to go home the same day, you will only stay overnight if medically necessary. 6. You MUST have a responsible adult to drive you home. 7. An adult MUST be with you the first 24 hours after you arrive home. 8. Bring a current list of your medications, and the last time and date medication taken. 9. Bring ID and current insurance cards. 10.Please wear clothes that are easy to get on and off and wear slip-on shoes.  Thank you for allowing [place] to care for you!   -- [facility]   Important Information About Sugar          Glenetta Hew, M.D., M.S. Interventional Cardiologist   Pager # [phone] Phone # [phone] [address] [place], [place] [address]   Thank you for choosing Heartcare at [facility]!!

## 2021-11-16 NOTE — Progress Notes (Unsigned)
Primary Care Provider: Gabriel Earing, FNP [facility] Cardiologist: Bryan Lemma, MD Electrophysiologist: None  Clinic Note: No chief complaint on file.   ===================================  ASSESSMENT/PLAN   Problem List Items Addressed This Visit   None   ===================================  HPI:    Larry Floyd is a 61 y.o. male who is being seen today for the evaluation of *** at the request of Gabriel Earing, FNP.  Larry Floyd was last seen on ***  Recent Hospitalizations: ***  Reviewed  CV studies:    The following studies were reviewed today: (if available, images/films reviewed: From Epic Chart or Care Everywhere) ***:   Interval History:   Larry Floyd   CV Review of Symptoms (Summary) Cardiovascular ROS: {roscv:310661}  REVIEWED OF SYSTEMS   ROS  I have reviewed and (if needed) personally updated the patient's problem list, medications, allergies, past medical and surgical history, social and family history.   PAST MEDICAL HISTORY   Past Medical History:  Diagnosis Date   Arthritis    Bronchitis    Cataract    Diabetes mellitus without complication (HCC)    Emphysema lung (HCC)    GERD (gastroesophageal reflux disease)    Hypercholesteremia    Hypertension     PAST SURGICAL HISTORY   Past Surgical History:  Procedure Laterality Date   BACK SURGERY     Lumbar   EYE SURGERY Bilateral    implant insertion   HERNIA REPAIR     x2    Immunization History  Administered Date(s) Administered   Janssen (J&J) SARS-COV-2 Vaccination 10/09/2019   Tdap 10/14/2021    MEDICATIONS/ALLERGIES   Current Meds  Medication Sig   albuterol (VENTOLIN HFA) 108 (90 Base) MCG/ACT inhaler Inhale 2 puffs into the lungs every 6 (six) hours as needed for wheezing or shortness of breath.   amLODipine (NORVASC) 2.5 MG tablet Take 1 tablet (2.5 mg total) by mouth daily.   atorvastatin (LIPITOR) 10 MG tablet Take 1 tablet (10 mg  total) by mouth daily.   cetirizine (ZYRTEC) 10 MG tablet Take 0.5 tablets (5 mg total) by mouth daily.   fluticasone-salmeterol (ADVAIR) 100-50 MCG/ACT AEPB Inhale 1 puff into the lungs 2 (two) times daily.   Fluticasone-Umeclidin-Vilant (TRELEGY ELLIPTA) 100-62.5-25 MCG/ACT AEPB Inhale 1 Inhalation into the lungs daily.   gabapentin (NEURONTIN) 600 MG tablet 1/2 po qam and 1 po qhs   hydrochlorothiazide (HYDRODIURIL) 25 MG tablet Take 1 tablet (25 mg total) by mouth daily.   meloxicam (MOBIC) 15 MG tablet Take 1 tablet (15 mg total) by mouth daily.   metFORMIN (GLUCOPHAGE) 1000 MG tablet Take 1 tablet (1,000 mg total) by mouth 2 (two) times daily with a meal.   nicotine (NICOTROL) 10 MG inhaler Inhale 1 continuous puffing into the lungs as needed for smoking cessation.   omeprazole (PRILOSEC) 20 MG capsule Take 1 capsule (20 mg total) by mouth daily.   sitaGLIPtin (JANUVIA) 100 MG tablet Take 1 tablet (100 mg total) by mouth daily.    No Known Allergies  SOCIAL HISTORY/FAMILY HISTORY   Reviewed in Epic:   Social History   Tobacco Use   Smoking status: Every Day    Packs/day: 3.00    Years: 45.00    Total pack years: 135.00    Types: Cigarettes   Smokeless tobacco: Never  Vaping Use   Vaping Use: Never used  Substance Use Topics   Alcohol use: Yes    Alcohol/week: 5.0 standard drinks  of alcohol    Types: 5 Shots of liquor per week    Comment: drinks "a couple shots" one night a week   Drug use: No   Social History   Social History Narrative   Not on file   Family History  Problem Relation Age of Onset   Stroke Mother    Hyperlipidemia Mother    Cancer Mother        throat cancer   Hypertension Mother    Atrial fibrillation Mother    Hypertension Father    Hyperlipidemia Father    COPD Father    Heart disease Father    AAA (abdominal aortic aneurysm) Father    Heart attack Father    Diabetes Sister    Heart murmur Sister    Hearing loss Daughter      OBJCTIVE -PE, EKG, labs   Wt Readings from Last 3 Encounters:  11/16/21 153 lb 3.2 oz (69.5 kg)  11/05/21 149 lb 8 oz (67.8 kg)  10/27/21 150 lb (68 kg)    Physical Exam: BP 140/78   Pulse 76   Ht 5\' 7"  (1.702 m)   Wt 153 lb 3.2 oz (69.5 kg)   SpO2 95%   BMI 23.99 kg/m  Physical Exam   Adult ECG Report  Rate: *** ;  Rhythm: {rhythm:17366};   Narrative Interpretation: ***  Recent Labs:  ***  Lab Results  Component Value Date   CHOL 154 10/14/2021   HDL 35 (L) 10/14/2021   LDLCALC 89 10/14/2021   TRIG 173 (H) 10/14/2021   CHOLHDL 4.4 10/14/2021   Lab Results  Component Value Date   CREATININE 0.79 10/14/2021   BUN 8 10/14/2021   NA 135 10/14/2021   K 4.0 10/14/2021   CL 95 (L) 10/14/2021   CO2 25 10/14/2021      Latest Ref Rng & Units 10/14/2021    2:40 PM 07/18/2014    5:15 PM 08/30/2007    2:21 PM  CBC  WBC 3.4 - 10.8 x10E3/uL 12.2  11.8    Hemoglobin 13.0 - 17.7 g/dL 40.9  81.1  91.4   Hematocrit 37.5 - 51.0 % 42.5  45.2  43.8   Platelets 150 - 450 x10E3/uL 360  319      Lab Results  Component Value Date   HGBA1C 6.7 (H) 10/14/2021   No results found for: "TSH"  ==================================================  COVID-19 Education: The signs and symptoms of COVID-19 were discussed with the patient and how to seek care for testing (follow up with PCP or arrange E-visit).    I spent a total of *** minutes with the patient spent in direct patient consultation.  Additional time spent with chart review  / charting (studies, outside notes, etc): *** min Total Time: *** min  Current medicines are reviewed at length with the patient today.  (+/- concerns) ***  This visit occurred during the SARS-CoV-2 public health emergency.  Safety protocols were in place, including screening questions prior to the visit, additional usage of staff PPE, and extensive cleaning of exam room while observing appropriate contact time as indicated for disinfecting  solutions.  Notice: This dictation was prepared with Dragon dictation along with smart phrase technology. Any transcriptional errors that result from this process are unintentional and may not be corrected upon review.   Studies Ordered:  No orders of the defined types were placed in this encounter.  No orders of the defined types were placed in this encounter.   Patient Instructions /  Medication Changes & Studies & Tests Ordered   There are no Patient Instructions on file for this visit.    Bryan Lemma, M.D., M.S. Interventional Cardiologist   Pager # [phone] Phone # [phone] [address] [place], [place] [address]   Thank you for choosing Heartcare at [facility]!!

## 2021-11-16 NOTE — Patient Instructions (Addendum)
Medication Instructions:      Start Imdur ( isosorbide Mononitrate ) 30 mg  daily  bedtime  Start Bisoprolol 5 mg one tablet daily         Increase taking Atorvastatin  to 40 mg daily at bedtime  *If you need a refill on your cardiac medications before your next appointment, please call your pharmacy*   Lab Work: No labs needed  Testing/Procedures: Will be schedule at [facility] Lab November 25, 2021  - [address]  Your physician has requested that you have a cardiac catheterization. Cardiac catheterization is used to diagnose and/or treat various heart conditions. Doctors may recommend this procedure for a number of different reasons. The most common reason is to evaluate chest pain. Chest pain can be a symptom of coronary artery disease (CAD), and cardiac catheterization can show whether plaque is narrowing or blocking your heart's arteries. This procedure is also used to evaluate the valves, as well as measure the blood flow and oxygen levels in different parts of your heart. For further information please visit [email or web address] Please follow instruction sheet, as given.    Follow-Up: At [facility], you and your health needs are our priority.  As part of our continuing mission to provide you with exceptional heart care, we have created designated Provider Care Teams.  These Care Teams include your primary Cardiologist (physician) and Advanced Practice Providers (APPs -  Physician Assistants and Nurse Practitioners) who all work together to provide you with the care you need, when you need it.  We recommend signing up for the patient portal called "MyChart".  Sign up information is provided on this After Visit Summary.  MyChart is used to connect with patients for Virtual Visits (Telemedicine).  Patients are able to view lab/test results, encounter notes, upcoming appointments, etc.  Non-urgent messages can be sent to your provider as well.   To learn more about  what you can do with MyChart, go to [email or web address].    Your next appointment:   3 week(s)  The format for your next appointment:   In Person  Provider:   Bryan Lemma, MD     Other Instructions  [facility] CARDIOVASCULAR DIVISION [facility] [address] [place] [address] [place] [address] Dept: [phone] Loc: [phone]  Larry Floyd  11/16/2021  You are scheduled for a Cardiac Catheterization on Wednesday, July 12 with Dr. Bryan Lemma.  1. Please arrive at the Main Entrance A at [facility]: [address] [place], [place] [address] at 6:30 AM (This time is two hours before your procedure to ensure your preparation). Free valet parking service is available.   Special note: Every effort is made to have your procedure done on time. Please understand that emergencies sometimes delay scheduled procedures.  2. Diet: Do not eat solid foods after midnight.  You may have clear liquids until 5 AM upon the day of the procedure.  3. Labs: not needed  4. Medication instructions in preparation for your procedure:   Contrast Allergy: No  Do not take Diabetes Med Glucophage (Metformin) on the day of the procedure and HOLD 48 HOURS AFTER THE PROCEDURE.  On the morning of your procedure, take Aspirin  81 mg and any morning medicines NOT listed above.  You may use sips of water.  5. Plan to go home the same day, you will only stay overnight if medically necessary. 6. You MUST have a responsible adult  to drive you home. 7. An adult MUST be with you the first 24 hours after you arrive home. 8. Bring a current list of your medications, and the last time and date medication taken. 9. Bring ID and current insurance cards. 10.Please wear clothes that are easy to get on and off and wear slip-on shoes.  Thank you for allowing [place] to care for you!   -- [place] [facility]   Important Information About  [place]

## 2021-11-17 ENCOUNTER — Encounter: Payer: Self-pay | Admitting: Cardiology

## 2021-11-17 NOTE — Assessment & Plan Note (Signed)
So far stable with no clear evidence of CAD and PAD, but symptoms would suggest that he does. A1c 6.7.  He is on metformin and Trelegy with borderline control.  Suspect that he needs more glycemic control and therefore probably need titration of medications per PCP.

## 2021-11-17 NOTE — Assessment & Plan Note (Signed)
Smoking cessation instruction/counseling given:  counseled patient on the dangers of tobacco use, advised patient to stop smoking, and reviewed strategies to maximize success => hopefully catheterization, if he does indicate significant disease will serve as a deterrent for smoking and help with smoking cessation counseling.

## 2021-11-17 NOTE — Assessment & Plan Note (Addendum)
Very concerned that he had a second very significant episode of chest pain in 2 years.  This time around he has noted a significant change in his functional status after the episode.  I am very concerned that he may have actually had an MI and is now having postinfarct angina type symptoms with exertional dyspnea.  Not necessarily having any more chest pain but he has not been doing much either.  The fact that he has significant exercise intolerance now that he had the episode I think we need to confirm presence or absence of CAD.  He has multiple risk factors including hypertension, hyperlipidemia and long-term smoking. Shared decision making discussed risk benefits alternatives all the indications of nuclear stress test versus Coronary CTA versus proceeding directly catheterization. Based on the severity of his symptoms and concern of his high risk of actually having CAD, we decided that skipping a functional study made sense and proceeding directly to cardiac catheterization with a pretty high risk symptomatology and risk factors.  PLAN: Left Heart Catheterization with Coronary Angiography and Possible PCI.  Shared Decision Making/Informed Consent The risks [stroke (1 in 1000), death (1 in 1000), kidney failure [usually temporary] (1 in 500), bleeding (1 in 200), allergic reaction [possibly serious] (1 in 200)], benefits (diagnostic support and management of coronary artery disease) and alternatives of a cardiac catheterization were discussed in detail with Larry Floyd and he is willing to proceed.  - Will check Pre-Cath labs unfortunately, his labs from PCP were on May 31, and would need a more recent set of labs.   He is actually taking 81 mg aspirin already will continue.  We will start Imdur 30 mg daily, bisoprolol 5 mg daily and increase statin from 10 to 40 mg of atorvastatin.

## 2021-11-17 NOTE — Assessment & Plan Note (Addendum)
Blood pressure seems little high today, but he says this is unusual for him.  I do think we have room to start low-dose beta-blocker.  We will continue amlodipine 2.5 mg and HCTZ 25 mg for now, and depending on what we see in cath will potentially consider switching to ARB  For now continue amlodipine 2.5 mg daily and start bisoprolol 5 mg daily.

## 2021-11-17 NOTE — Assessment & Plan Note (Addendum)
Given high concern for active CAD and potentially with PAD given his potential claudication symptoms, we will shoot for LDL less than 70.  Most recent studies showed LDL 89.  Plan: Increase dose and change potency to rosuvastatin 40 mg daily.

## 2021-11-18 NOTE — Telephone Encounter (Signed)
Spoke to patient's wife --  informed her that patient needs lab work for upcoming left heart cath on November 25, 2021  She voiced understanding . She states she will have him to go tomorrow for lab test.  She is aware he does not need to be fasting.

## 2021-11-19 ENCOUNTER — Other Ambulatory Visit: Payer: 59

## 2021-11-19 DIAGNOSIS — E119 Type 2 diabetes mellitus without complications: Secondary | ICD-10-CM | POA: Diagnosis not present

## 2021-11-19 DIAGNOSIS — R69 Illness, unspecified: Secondary | ICD-10-CM | POA: Diagnosis not present

## 2021-11-19 DIAGNOSIS — E7849 Other hyperlipidemia: Secondary | ICD-10-CM | POA: Diagnosis not present

## 2021-11-19 DIAGNOSIS — I10 Essential (primary) hypertension: Secondary | ICD-10-CM | POA: Diagnosis not present

## 2021-11-19 DIAGNOSIS — I200 Unstable angina: Secondary | ICD-10-CM | POA: Diagnosis not present

## 2021-11-20 LAB — CBC
Hematocrit: 41.1 % (ref 37.5–51.0)
Hemoglobin: 13.8 g/dL (ref 13.0–17.7)
MCH: 29 pg (ref 26.6–33.0)
MCHC: 33.6 g/dL (ref 31.5–35.7)
MCV: 86 fL (ref 79–97)
Platelets: 342 10*3/uL (ref 150–450)
RBC: 4.76 x10E6/uL (ref 4.14–5.80)
RDW: 14 % (ref 11.6–15.4)
WBC: 12.1 10*3/uL — ABNORMAL HIGH (ref 3.4–10.8)

## 2021-11-20 LAB — BASIC METABOLIC PANEL
BUN/Creatinine Ratio: 10 (ref 10–24)
BUN: 10 mg/dL (ref 8–27)
CO2: 22 mmol/L (ref 20–29)
Calcium: 9.4 mg/dL (ref 8.6–10.2)
Chloride: 97 mmol/L (ref 96–106)
Creatinine, Ser: 1.02 mg/dL (ref 0.76–1.27)
Glucose: 109 mg/dL — ABNORMAL HIGH (ref 70–99)
Potassium: 4.2 mmol/L (ref 3.5–5.2)
Sodium: 136 mmol/L (ref 134–144)
eGFR: 84 mL/min/{1.73_m2} (ref >59)

## 2021-11-23 ENCOUNTER — Telehealth: Payer: Self-pay | Admitting: *Deleted

## 2021-11-23 NOTE — Telephone Encounter (Signed)
Cardiac Catheterization scheduled at [facility] for: Wednesday November 25, 2021 8:30 AM Arrival time and place: [facility] Main Entrance A at: 6:30 AM   Nothing to eat after midnight prior to procedure, clear liquids until 5 AM day of procedure.  Medication instructions -Hold:  Metformin-day of procedure and 48 hours post procedure  Januvia-AM of procedure  HCTZ-AM of procedure  -Except hold medications usual morning medications can be taken with sips of water including aspirin 81 mg.  Confirmed patient has responsible adult to drive home post procedure and be with patient first 24 hours after arriving home.  Patient reports no new symptoms concerning for COVID-19 in the past 10 days.  Reviewed procedure instructions with patient.

## 2021-11-25 ENCOUNTER — Encounter (HOSPITAL_COMMUNITY)
Admission: RE | Disposition: A | Discharge status: Home / Self Care | Payer: Self-pay | Source: Home / Self Care | Attending: Cardiology

## 2021-11-25 ENCOUNTER — Ambulatory Visit (HOSPITAL_COMMUNITY)
Admission: RE | Admit: 2021-11-25 | Discharge: 2021-11-25 | Disposition: A | Discharge status: Home / Self Care | Payer: 59 | Attending: Cardiology | Admitting: Cardiology

## 2021-11-25 ENCOUNTER — Other Ambulatory Visit: Payer: Self-pay

## 2021-11-25 DIAGNOSIS — I10 Essential (primary) hypertension: Secondary | ICD-10-CM | POA: Insufficient documentation

## 2021-11-25 DIAGNOSIS — Z7984 Long term (current) use of oral hypoglycemic drugs: Secondary | ICD-10-CM | POA: Diagnosis not present

## 2021-11-25 DIAGNOSIS — Z7982 Long term (current) use of aspirin: Secondary | ICD-10-CM | POA: Insufficient documentation

## 2021-11-25 DIAGNOSIS — E7849 Other hyperlipidemia: Secondary | ICD-10-CM

## 2021-11-25 DIAGNOSIS — R69 Illness, unspecified: Secondary | ICD-10-CM | POA: Diagnosis not present

## 2021-11-25 DIAGNOSIS — Z79899 Other long term (current) drug therapy: Secondary | ICD-10-CM | POA: Diagnosis not present

## 2021-11-25 DIAGNOSIS — E119 Type 2 diabetes mellitus without complications: Secondary | ICD-10-CM | POA: Diagnosis not present

## 2021-11-25 DIAGNOSIS — E785 Hyperlipidemia, unspecified: Secondary | ICD-10-CM | POA: Diagnosis not present

## 2021-11-25 DIAGNOSIS — J449 Chronic obstructive pulmonary disease, unspecified: Secondary | ICD-10-CM | POA: Insufficient documentation

## 2021-11-25 DIAGNOSIS — F17210 Nicotine dependence, cigarettes, uncomplicated: Secondary | ICD-10-CM | POA: Diagnosis not present

## 2021-11-25 DIAGNOSIS — I200 Unstable angina: Secondary | ICD-10-CM

## 2021-11-25 DIAGNOSIS — I25110 Atherosclerotic heart disease of native coronary artery with unstable angina pectoris: Secondary | ICD-10-CM

## 2021-11-25 DIAGNOSIS — F17200 Nicotine dependence, unspecified, uncomplicated: Secondary | ICD-10-CM

## 2021-11-25 HISTORY — PX: INTRAVASCULAR PRESSURE WIRE/FFR STUDY: CATH118243

## 2021-11-25 HISTORY — PX: LEFT HEART CATH AND CORONARY ANGIOGRAPHY: CATH118249

## 2021-11-25 LAB — GLUCOSE, CAPILLARY: Glucose-Capillary: 147 mg/dL — ABNORMAL HIGH (ref 70–99)

## 2021-11-25 LAB — POCT ACTIVATED CLOTTING TIME: Activated Clotting Time: 263 seconds

## 2021-11-25 SURGERY — LEFT HEART CATH AND CORONARY ANGIOGRAPHY
Anesthesia: LOCAL

## 2021-11-25 MED ORDER — MIDAZOLAM HCL 2 MG/2ML IJ SOLN
INTRAMUSCULAR | Status: AC
  Filled 2021-11-25: qty 2

## 2021-11-25 MED ORDER — ONDANSETRON HCL 4 MG/2ML IJ SOLN: 4.0000 mg | Freq: Four times a day (QID) | INTRAMUSCULAR | Status: DC | PRN

## 2021-11-25 MED ORDER — HEPARIN (PORCINE) IN NACL 1000-0.9 UT/500ML-% IV SOLN
INTRAVENOUS | Status: AC
  Filled 2021-11-25: qty 1000

## 2021-11-25 MED ORDER — LABETALOL HCL 5 MG/ML IV SOLN: 10.0000 mg | INTRAVENOUS | Status: DC | PRN

## 2021-11-25 MED ORDER — VERAPAMIL HCL 2.5 MG/ML IV SOLN
INTRAVENOUS | Status: DC | PRN
  Administered 2021-11-25: 15 mL via INTRA_ARTERIAL

## 2021-11-25 MED ORDER — LIDOCAINE HCL (PF) 1 % IJ SOLN
INTRAMUSCULAR | Status: AC
  Filled 2021-11-25: qty 30

## 2021-11-25 MED ORDER — VERAPAMIL HCL 2.5 MG/ML IV SOLN
INTRAVENOUS | Status: AC
  Filled 2021-11-25: qty 2

## 2021-11-25 MED ORDER — SODIUM CHLORIDE 0.9% FLUSH: 3.0000 mL | INTRAVENOUS | Status: DC | PRN

## 2021-11-25 MED ORDER — IOHEXOL 350 MG/ML SOLN
INTRAVENOUS | Status: DC | PRN
  Administered 2021-11-25: 80 mL

## 2021-11-25 MED ORDER — SODIUM CHLORIDE 0.9% FLUSH
3.0000 mL | INTRAVENOUS | Status: DC | PRN
Start: 2021-11-25 — End: 2021-11-25

## 2021-11-25 MED ORDER — SODIUM CHLORIDE 0.9% FLUSH: 3.0000 mL | Freq: Two times a day (BID) | INTRAVENOUS | Status: DC

## 2021-11-25 MED ORDER — FENTANYL CITRATE (PF) 100 MCG/2ML IJ SOLN
INTRAMUSCULAR | Status: DC | PRN
  Administered 2021-11-25: 25 ug via INTRAVENOUS

## 2021-11-25 MED ORDER — SODIUM CHLORIDE 0.9 % IV SOLN: 250.0000 mL | INTRAVENOUS | Status: DC | PRN

## 2021-11-25 MED ORDER — HYDRALAZINE HCL 20 MG/ML IJ SOLN: 10.0000 mg | INTRAMUSCULAR | Status: DC | PRN

## 2021-11-25 MED ORDER — HEPARIN (PORCINE) IN NACL 1000-0.9 UT/500ML-% IV SOLN
INTRAVENOUS | Status: DC | PRN
  Administered 2021-11-25 (×2): 500 mL

## 2021-11-25 MED ORDER — FENTANYL CITRATE (PF) 100 MCG/2ML IJ SOLN
INTRAMUSCULAR | Status: AC
  Filled 2021-11-25: qty 2

## 2021-11-25 MED ORDER — HEPARIN SODIUM (PORCINE) 1000 UNIT/ML IJ SOLN
INTRAMUSCULAR | Status: AC
  Filled 2021-11-25: qty 10

## 2021-11-25 MED ORDER — MIDAZOLAM HCL 2 MG/2ML IJ SOLN
INTRAMUSCULAR | Status: DC | PRN
  Administered 2021-11-25: 1 mg via INTRAVENOUS

## 2021-11-25 MED ORDER — SODIUM CHLORIDE 0.9 % WEIGHT BASED INFUSION: 1.0000 mL/kg/h | INTRAVENOUS | Status: DC

## 2021-11-25 MED ORDER — ACETAMINOPHEN 325 MG PO TABS: 650.0000 mg | ORAL_TABLET | ORAL | Status: DC | PRN

## 2021-11-25 MED ORDER — SODIUM CHLORIDE 0.9 % WEIGHT BASED INFUSION
3.0000 mL/kg/h | INTRAVENOUS | Status: AC
  Administered 2021-11-25: 3 mL/kg/h via INTRAVENOUS

## 2021-11-25 MED ORDER — HEPARIN SODIUM (PORCINE) 1000 UNIT/ML IJ SOLN
INTRAMUSCULAR | Status: DC | PRN
  Administered 2021-11-25: 3500 [IU] via INTRAVENOUS
  Administered 2021-11-25: 4000 [IU] via INTRAVENOUS

## 2021-11-25 MED ORDER — SODIUM CHLORIDE 0.9 % WEIGHT BASED INFUSION
1.0000 mL/kg/h | INTRAVENOUS | Status: DC
Start: 2021-11-25 — End: 2021-11-25

## 2021-11-25 MED ORDER — LIDOCAINE HCL (PF) 1 % IJ SOLN
INTRAMUSCULAR | Status: DC | PRN
  Administered 2021-11-25: 2 mL

## 2021-11-25 SURGICAL SUPPLY — 14 items
BAND CMPR LRG ZPHR (HEMOSTASIS) ×1
BAND ZEPHYR COMPRESS 30 LONG (HEMOSTASIS) ×1 IMPLANT
CATH LAUNCHER 5F EBU3.5 (CATHETERS) ×1 IMPLANT
CATH OPTITORQUE TIG 4.0 5F (CATHETERS) ×1 IMPLANT
GLIDESHEATH SLEND SS 6F .021 (SHEATH) ×1 IMPLANT
GUIDEWIRE INQWIRE 1.5J.035X260 (WIRE) IMPLANT
GUIDEWIRE PRESSURE X 175 (WIRE) ×1 IMPLANT
INQWIRE 1.5J .035X260CM (WIRE) ×2
KIT ESSENTIALS PG (KITS) ×1 IMPLANT
KIT HEART LEFT (KITS) ×3 IMPLANT
PACK CARDIAC CATHETERIZATION (CUSTOM PROCEDURE TRAY) ×3 IMPLANT
SHEATH PROBE COVER 6X72 (BAG) ×1 IMPLANT
TRANSDUCER W/STOPCOCK (MISCELLANEOUS) ×3 IMPLANT
TUBING CIL FLEX 10 FLL-RA (TUBING) ×3 IMPLANT

## 2021-11-25 NOTE — Interval H&P Note (Signed)
History and Physical Interval Note:  11/25/2021 8:24 AM  Larry Floyd  has presented today for surgery, with the diagnosis of progressive angina.  The various methods of treatment have been discussed with the patient and family. After consideration of risks, benefits and other options for treatment, the patient has consented to  Procedure(s): LEFT HEART CATH AND CORONARY ANGIOGRAPHY (N/A) PERCUTANEOUS CORONARY INTERVENTION    as a surgical intervention.  The patient's history has been reviewed, patient examined, no change in status, stable for surgery.  I have reviewed the patient's chart and labs.  Questions were answered to the patient's satisfaction.    Cath Lab Visit (complete for each Cath Lab visit)  Clinical Evaluation Leading to the Procedure:   ACS: No.  Non-ACS:    Anginal Classification: CCS III -> he has noted some improvement in symptoms after initiation of nitrate and bisoprolol, has not had another serious episode of chest pain, but he continues to have exertional dyspnea  Anti-ischemic medical therapy: Maximal Therapy (2 or more classes of medications)  Non-Invasive Test Results: No non-invasive testing performed  Prior CABG: No previous CABG   Bryan Lemma

## 2021-11-26 ENCOUNTER — Encounter (HOSPITAL_COMMUNITY): Payer: Self-pay | Admitting: Cardiology

## 2021-11-30 ENCOUNTER — Other Ambulatory Visit: Payer: Self-pay | Admitting: Family Medicine

## 2021-11-30 DIAGNOSIS — G8929 Other chronic pain: Secondary | ICD-10-CM

## 2021-12-03 ENCOUNTER — Ambulatory Visit (INDEPENDENT_AMBULATORY_CARE_PROVIDER_SITE_OTHER): Payer: 59 | Admitting: Family Medicine

## 2021-12-03 ENCOUNTER — Encounter: Payer: Self-pay | Admitting: Family Medicine

## 2021-12-03 VITALS — BP 116/72 | HR 68 | Temp 97.9°F | Ht 67.0 in | Wt 151.5 lb

## 2021-12-03 DIAGNOSIS — J449 Chronic obstructive pulmonary disease, unspecified: Secondary | ICD-10-CM

## 2021-12-03 DIAGNOSIS — I10 Essential (primary) hypertension: Secondary | ICD-10-CM | POA: Diagnosis not present

## 2021-12-03 DIAGNOSIS — I25110 Atherosclerotic heart disease of native coronary artery with unstable angina pectoris: Secondary | ICD-10-CM | POA: Diagnosis not present

## 2021-12-03 NOTE — Patient Instructions (Signed)
[facility] [address] [place] [address] [phone]

## 2021-12-03 NOTE — Progress Notes (Signed)
   Established Patient Office Visit  Subjective   Patient ID: Larry Floyd, male    DOB: 1960/10/06  Age: 61 y.o. MRN: [id number]  Chief Complaint  Patient presents with   COPD    COPD His past medical history is significant for COPD.   [name] is here with his wife. Here for a follow up of COPD. He started on trelegy at his last appointment. He reports improvement in his symptoms. He reports decreased cough with decreased sputum. He reports shortness of breath that is intermittently with activity but improved from previous. He uses albuterol 1-2x a day. Denies wheezing.   He has had a cardiac cath since his last visit. They did find blockages and told him that he did have an MI. He has felt fatigued since then. He was also started on imdur and bioprolol and his lipitor dosage was increased.   Past Medical History:  Diagnosis Date   Arthritis    Bronchitis    Cataract    Diabetes mellitus without complication (HCC)    Emphysema lung (HCC)    GERD (gastroesophageal reflux disease)    Hypercholesteremia    Hypertension       ROS As per HPI.    Objective:     BP 116/72   Pulse 68   Temp 97.9 F (36.6 C) (Temporal)   Ht 5\' 7"  (1.702 m)   Wt 151 lb 8 oz (68.7 kg)   SpO2 94%   BMI 23.73 kg/m  BP Readings from Last 3 Encounters:  12/03/21 116/72  11/25/21 117/85  11/16/21 140/78      Physical Exam Vitals and nursing note reviewed.  Constitutional:      General: He is not in acute distress.    Appearance: He is not ill-appearing, toxic-appearing or diaphoretic.  Neck:     Vascular: No carotid bruit.  Cardiovascular:     Rate and Rhythm: Normal rate and regular rhythm.     Heart sounds: Normal heart sounds. No murmur heard. Pulmonary:     Effort: Pulmonary effort is normal. No respiratory distress.     Breath sounds: Normal breath sounds.  Musculoskeletal:     Right lower leg: No edema.     Left lower leg: No edema.  Lymphadenopathy:     Cervical: No  cervical adenopathy.  Skin:    General: Skin is warm and dry.  Neurological:     General: No focal deficit present.     Mental Status: He is alert and oriented to person, place, and time.  Psychiatric:        Mood and Affect: Mood normal.        Behavior: Behavior normal.    No results found for any visits on 12/03/21.    The 10-year ASCVD risk score (Arnett DK, et al., 2019) is: 24.7%    Assessment & Plan:   [name] was seen today for copd.  Diagnoses and all orders for this visit:  Primary hypertension Well controlled on current regimen.   Chronic obstructive pulmonary disease, unspecified COPD type (HCC) Improving. Continue trelegy. Albuterol prn. No signs of exacerbation.   Coronary artery disease involving native coronary artery of native heart with unstable angina pectoris [facility]) Managed by cardiology. On imdur, norvasc, liptior, bisoprolol, aspirin.   Return in about 3 months (around 03/05/2022) for chronic follow up.  The patient indicates understanding of these issues and agrees with the plan.   03/07/2022, FNP

## 2021-12-14 ENCOUNTER — Encounter: Payer: Self-pay | Admitting: Cardiology

## 2021-12-14 ENCOUNTER — Ambulatory Visit (INDEPENDENT_AMBULATORY_CARE_PROVIDER_SITE_OTHER): Payer: 59 | Admitting: Cardiology

## 2021-12-14 VITALS — BP 120/64 | HR 70 | Ht 67.0 in | Wt 151.4 lb

## 2021-12-14 DIAGNOSIS — E785 Hyperlipidemia, unspecified: Secondary | ICD-10-CM | POA: Diagnosis not present

## 2021-12-14 DIAGNOSIS — R5382 Chronic fatigue, unspecified: Secondary | ICD-10-CM | POA: Diagnosis not present

## 2021-12-14 DIAGNOSIS — F17200 Nicotine dependence, unspecified, uncomplicated: Secondary | ICD-10-CM

## 2021-12-14 DIAGNOSIS — I200 Unstable angina: Secondary | ICD-10-CM

## 2021-12-14 DIAGNOSIS — E782 Mixed hyperlipidemia: Secondary | ICD-10-CM | POA: Insufficient documentation

## 2021-12-14 DIAGNOSIS — R9389 Abnormal findings on diagnostic imaging of other specified body structures: Secondary | ICD-10-CM

## 2021-12-14 DIAGNOSIS — R5383 Other fatigue: Secondary | ICD-10-CM | POA: Insufficient documentation

## 2021-12-14 DIAGNOSIS — R0609 Other forms of dyspnea: Secondary | ICD-10-CM | POA: Diagnosis not present

## 2021-12-14 DIAGNOSIS — R072 Precordial pain: Secondary | ICD-10-CM

## 2021-12-14 DIAGNOSIS — I10 Essential (primary) hypertension: Secondary | ICD-10-CM

## 2021-12-14 DIAGNOSIS — E1169 Type 2 diabetes mellitus with other specified complication: Secondary | ICD-10-CM | POA: Diagnosis not present

## 2021-12-14 DIAGNOSIS — R69 Illness, unspecified: Secondary | ICD-10-CM | POA: Diagnosis not present

## 2021-12-14 NOTE — Assessment & Plan Note (Signed)
Anterior wall motion abnormality and abnormality gram.  Need to confirm if there is indeed a regional wall motion abnormality which would then potentially corroborate my consideration for him having had an MI last year.  Plan: Check 2D echo

## 2021-12-14 NOTE — Progress Notes (Signed)
Primary Care Provider: Gabriel Floyd, Larry M, FNP Cardiologist: Bryan Lemmaavid Shearon Clonch, MD Electrophysiologist: None  Clinic Note: Chief Complaint  Patient presents with   Hospitalization Follow-up    Post cath follow-up-still having chest pain despite no evidence of significant CAD.    ===================================  ASSESSMENT/PLAN   Problem List Items Addressed This Visit       Cardiology Problems   Hyperlipidemia associated with type 2 diabetes mellitus (HCC) (Chronic)    LDL as of May 31 was 31 with TG 174.  Based on his symptoms, I had him increase his rosuvastatin from 20 to 40 mg daily.  Can reassess FLP in BMP in 2 to 3 months prior to follow-up. Plan: Continue with 40 mg atorvastatin.      Relevant Orders   Hepatic function panel   Lipid panel   Progressive angina (HCC)    Thankfully, his cath did not show any evidence of significant CAD.  He had RFR negative lesion in the circumflex.  This was the only potentially concerning lesion.  Cannot exclude microvascular disease.  My concern was that he had had a prior infarct. Perhaps the only concerning feature was the fact that there is anterior wall motion normality noted on LV gram.  Plan: Recommend TTE based on LV gram findings.      Primary hypertension (Chronic)    Well-controlled blood pressure.  He is on low-dose amlodipine which is potentially beneficial for microvascular disease, will continue that.  But I think he can discontinue Imdur. Tolerating bisoprolol.  Also for CAD. HCTZ       Relevant Orders   ECHOCARDIOGRAM COMPLETE     Other   Abnormal ventricular wall motion    Anterior wall motion abnormality and abnormality gram.  Need to confirm if there is indeed a regional wall motion abnormality which would then potentially corroborate my consideration for him having had an MI last year.  Plan: Check 2D echo      Relevant Orders   ECHOCARDIOGRAM COMPLETE   DOE (dyspnea on exertion) - Primary   Relevant  Orders   ECHOCARDIOGRAM COMPLETE   Fatigue   Relevant Orders   ECHOCARDIOGRAM COMPLETE   Precordial pain    The current chest discomfort it to me sounds very unlikely be cardiac in nature, the previous episode was much more concerning.  Thankfully, he does not have significant disease on cath.  I think this is probably musculoskeletal, especially now has some reproducible symptoms.  I am still little worried though with regional wall motion abnormality suggested on LV gram. Plan: Check 2D echo      Relevant Orders   ECHOCARDIOGRAM COMPLETE   Tobacco use disorder (Chronic)    Smoking cessation instruction/counseling given:  counseled patient on the dangers of tobacco use, advised patient to stop smoking, and reviewed strategies to maximize success; he did not seem all that interested in hearing.        ===================================  HPI:    Larry Floyd is a 61 y.o. male long-term heavy smoker with PMH Notable for COPD (Chronic Bronchitis), DM-2, HTN, HLD who presents today for Post Cath Follow-Up. His initial presentation was for evaluation of CHEST PAIN-suggestive of PROGRESSIVE ANGINA, at the request of Larry Floyd, Larry M, FNP.  Larry Floyd was l seen in initial consult on July 3 with complaints of chest pain associated with dizziness and dyspnea.  He had a significant episode on 21 June with discomfort in his chest and headache.  This began as a sharp  stabbing type pain and then became a generalized pressure type discomfort throughout his chest causing him to feel very short of breath.  He came clammy, dizzy and short of breath.  He felt considerably better taking 4 baby aspirin.  Apparently he contacted EMS, but did not go for evaluation at the time, went to see his PCP the next day, and was chest pain-free.  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  He is profoundly dyspneic with similar TAVR chest discomfort radiating to the arm and numbness.  This was never evaluated.  He noted that since this most recent episode, he was has noted feeling profoundly tired and more weak and easily worn out.  Feels that his heart rate goes up faster than usual with activity now. With his Cardiac Risk Factors seen in PMH above and the symptoms which are concerning for even potentially a missed MI, I was concerned that he had had a possible infarct with mild peri-infarct symptoms.  Symptoms are concerning for progressive angina and he was therefore scheduled for cardiac catheterization on July 12.  Recent Hospitalizations:  Cardiac Cath November 25, 2021  Reviewed  CV studies:    The following studies were reviewed today: (if available, images/films reviewed: From Epic Chart or Care Everywhere) Cardiac Catheterization/Coronary Angiography 11/26/2018:   Angiographically Mod 1 V CAD w/ Dist LCx 60% lesion that was RFR negative.   Prox RCA lesion is 20% stenosed.  Mid RCA lesion is 10% stenosed.  Prox LAD to Mid LAD lesion is 15% stenosed.  Normal LV function-EF~50 to 55%, with mildly elevated LVEDP. LV gram suggest possible anterior wall motion abnormality.  Would recommend 2D echo to better assess.               Interval History:   Larry Floyd returns here today for post cath follow-up happy to hear the results of the cath, but still not sure why he is feeling so tired and fatigued.  He is persistent exertional dyspnea.  Not really noticing any PND orthopnea. What he describes now is an episodic occurrence of a different type of chest discomfort more left lateral almost along the axillary region.  He describes it as sharp stinging type of pain.  Not like the pressure that he had leading to heart cath. He still feels his heart rate go up a little bit, but that is also improving. He denies any PND, orthopnea or edema.  No real claudication.  Just exercise  intolerance. Radial cath site is intact.  No issues.  CV Review of Symptoms (Summary) Cardiovascular ROS: positive for - chest pain, dyspnea on exertion, palpitations, rapid heart rate, shortness of breath, and fatigue, exercise intolerance. negative for - edema, loss of consciousness, orthopnea, paroxysmal nocturnal dyspnea, or near syncope, TIA/amaurosis fugax or claudication.  REVIEWED OF SYSTEMS   Review of Systems  Constitutional:  Positive for malaise/fatigue (Exercise intolerance).  HENT:  Negative for congestion.   Respiratory:  Positive for cough (Chronic) and shortness of breath (Per HPI). Negative for sputum production.   Gastrointestinal:  Negative for abdominal pain, blood in stool, constipation and melena.  Genitourinary:  Negative for hematuria.  Musculoskeletal:  Positive for joint pain. Negative for falls.  Neurological:  Positive for dizziness. Negative for focal weakness and weakness.  Psychiatric/Behavioral:  Negative for depression and memory loss.  The patient is nervous/anxious (He has been anxious since the episode of chest pain). The patient does not have insomnia.   All other systems reviewed and are negative.   I have reviewed and (if needed) personally updated the patient's problem list, medications, allergies, past medical and surgical history, social and family history.   PAST MEDICAL HISTORY   Past Medical History:  Diagnosis Date   Arthritis    Bronchitis    Cataract    Diabetes mellitus without complication (HCC)    Emphysema lung (HCC)    GERD (gastroesophageal reflux disease)    Hypercholesteremia    Hypertension     PAST SURGICAL HISTORY   Past Surgical History:  Procedure Laterality Date   BACK SURGERY     Lumbar   EYE SURGERY Bilateral    implant insertion   HERNIA REPAIR     x2   INTRAVASCULAR PRESSURE WIRE/FFR STUDY N/A 11/25/2021   Procedure: INTRAVASCULAR PRESSURE WIRE/FFR STUDY;  Surgeon: Marykay Lex, MD;  Location: [facility]  INVASIVE CV LAB;  Service: Cardiovascular;  Laterality: N/A;   LEFT HEART CATH AND CORONARY ANGIOGRAPHY N/A 11/25/2021   Procedure: LEFT HEART CATH AND CORONARY ANGIOGRAPHY;  Surgeon: Marykay Lex, MD;  Location: [facility] INVASIVE CV LAB::   Angiographically Mod 1 V CAD w/ Dist LCx 60% - RFR NEGATIVE lesion.   Prox RCA 20%.  Mid RCA 10%.  Prox LAD to Mid LAD 15% . Normal LV function-EF~50 to 55%, with mildly elevated LVEDP. LV- gram suggest possible anterior wall motion abnormality.  REC: TTE to better assess.    Immunization History  Administered Date(s) Administered   Janssen (J&J) SARS-COV-2 Vaccination 10/09/2019   Tdap 10/14/2021    MEDICATIONS/ALLERGIES   Current Meds  Medication Sig   albuterol (VENTOLIN HFA) 108 (90 Base) MCG/ACT inhaler Inhale 2 puffs into the lungs every 6 (six) hours as needed for wheezing or shortness of breath.   amLODipine (NORVASC) 2.5 MG tablet Take 1 tablet (2.5 mg total) by mouth daily.   aspirin EC 81 MG tablet Take 81 mg by mouth in the morning. Swallow whole.   atorvastatin (LIPITOR) 40 MG tablet Take 1 tablet (40 mg total) by mouth daily. (Patient taking differently: Take 40 mg by mouth every evening.)   bisoprolol (ZEBETA) 5 MG tablet Take 1 tablet (5 mg total) by mouth daily.   cetirizine (ZYRTEC) 10 MG tablet Take 0.5 tablets (5 mg total) by mouth daily. (Patient taking differently: Take 10 mg by mouth daily.)   Fluticasone-Umeclidin-Vilant (TRELEGY ELLIPTA) 100-62.5-25 MCG/ACT AEPB Inhale 1 Inhalation into the lungs daily.   gabapentin (NEURONTIN) 600 MG tablet 1/2 po qam and 1 po qhs (Patient taking differently: Take 300 mg by mouth in the morning and at bedtime.)   hydrochlorothiazide (HYDRODIURIL) 25 MG tablet Take 1 tablet (25 mg total) by mouth daily.   meloxicam (MOBIC) 15 MG tablet Take 1 tablet by mouth once daily   metFORMIN (GLUCOPHAGE) 1000 MG tablet Take 1 tablet (1,000 mg total) by mouth 2 (two) times daily with a meal. (Patient taking  differently: Take 2,000 mg by mouth in the morning.)   omeprazole (PRILOSEC) 20 MG capsule Take 1 capsule (20 mg total) by mouth daily.   sitaGLIPtin (JANUVIA) 100 MG tablet Take 1 tablet (100 mg total) by mouth daily.   [DISCONTINUED] isosorbide mononitrate (IMDUR) 30 MG 24 hr tablet Take 1 tablet (30 mg total) by mouth at bedtime.    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   Still smokes.    OBJCTIVE -PE, EKG, labs   Wt Readings from Last 3 Encounters:  12/14/21 151 lb 6.4 oz (68.7 kg)  12/03/21 151 lb 8 oz (68.7 kg)  11/25/21 150 lb (68 kg)    Physical Exam: BP 120/64   Pulse 70   Ht 5\' 7"  (1.702 Floyd)   Wt 151 lb 6.4 oz (68.7 kg)   SpO2 96%   BMI 23.71 kg/Floyd  Physical Exam Vitals reviewed.  Constitutional:      General: He is not in acute distress.    Appearance: Normal appearance. He is normal weight. He is not ill-appearing (Well-groomed.  Healthy-appearing.) or toxic-appearing.  HENT:     Head: Normocephalic and atraumatic.  Eyes:     Extraocular Movements: Extraocular movements intact.     Pupils: Pupils are equal, round, and reactive to light.  Cardiovascular:     Rate and Rhythm: Normal rate and regular rhythm.     Pulses: Normal pulses.     Heart sounds: Normal heart sounds. No murmur heard.    No friction rub. No gallop.  Pulmonary:     Effort: Pulmonary effort is normal. No respiratory  distress.     Breath sounds: Normal breath sounds. No wheezing, rhonchi or rales.  Chest:     Chest wall: Tenderness (Left lateral) present.  Musculoskeletal:        General: No swelling. Normal range of motion.     Comments: Right radial cath site C/C/I.  No ecchymosis.  Skin:    General: Skin is warm and dry.     Findings: No bruising.  Neurological:     General: No focal deficit present.     Mental Status: He is alert and oriented to person, place, and time. Mental status is at baseline.  Psychiatric:        Mood and Affect: Mood normal.        Behavior: Behavior normal.        Thought Content: Thought content normal.        Judgment: Judgment normal.      Adult ECG Report NA  Recent Labs: Reviewed Lab Results  Component Value Date   CHOL 154 10/14/2021   HDL 35 (L) 10/14/2021   LDLCALC 89 10/14/2021   TRIG 173 (H) 10/14/2021   CHOLHDL 4.4 10/14/2021   Lab Results  Component Value Date   CREATININE 1.02 11/19/2021   BUN 10 11/19/2021   NA 136 11/19/2021   K 4.2 11/19/2021   CL 97 11/19/2021   CO2 22 11/19/2021      Latest Ref Rng & Units 11/19/2021    1:54 PM 10/14/2021    2:40 PM 07/18/2014    5:15 PM  CBC  WBC 3.4 - 10.8 x10E3/uL 12.1  12.2  11.8   Hemoglobin 13.0 - 17.7 g/dL 01.7  51.0  25.8   Hematocrit 37.5 - 51.0 % 41.1  42.5  45.2   Platelets 150 - 450 x10E3/uL 342  360  319     Lab Results  Component Value Date   HGBA1C 6.7 (H) 10/14/2021   No results found for: "TSH"  ================================================== I spent a total of 23 minutes with the patient spent in direct patient consultation.  Additional time spent with chart review  / charting (studies, outside notes, etc): 29 min Total Time: 52 min  Current medicines are reviewed at length with the patient today.  (+/- concerns) N/A  Notice: This dictation was prepared with Dragon dictation along with smart phrase technology. Any transcriptional errors that result from this process  are unintentional and may not be corrected upon review.  Studies Ordered:   Orders Placed This Encounter  Procedures   Hepatic function panel   Lipid panel   ECHOCARDIOGRAM COMPLETE   No orders of the defined types were placed in this encounter.   Patient Instructions / Medication Changes & Studies & Tests Ordered   Patient Instructions  Medication Instructions:  Stop taking Imdur   *If you need a refill on your cardiac medications before your next appointment, please call your pharmacy*   Lab Work: in Oct 2023  Lipid  liver function panel    If you have labs (blood work) drawn today and your tests are completely normal, you will receive your results only by: MyChart Message (if you have MyChart) OR A paper copy in the mail If you have any lab test that is abnormal or we need to change your treatment, we will call you to review the results.   Testing/Procedures: Will be schedule at [facility] [address] Your physician has requested that you have an echocardiogram. Echocardiography is a painless test that uses sound waves to create images of your heart. It provides your doctor with information about the size and shape of your heart and how well your heart's chambers and valves are working. This procedure takes approximately one hour. There are no restrictions for this procedure.    Follow-Up: At [facility], you and your health needs are our priority.  As part of our continuing mission to provide you with exceptional heart care, we have created designated Provider Care Teams.  These Care Teams include your primary Cardiologist (physician) and Advanced Practice Providers (APPs -  Physician Assistants and Nurse Practitioners) who all work together to provide you with the care you need, when you need it.  We recommend signing up for the patient portal called "MyChart".  Sign up information is provided on this After Visit Summary.  MyChart is used to connect with  patients for Virtual Visits (Telemedicine).  Patients are able to view lab/test results, encounter notes, upcoming appointments, etc.  Non-urgent messages can be sent to your provider as well.   To learn more about what you can do with MyChart, go to [email or web address].    Your next appointment:   3 month(s)  The format for your next appointment:   In Person  Provider:   Bryan Lemma, MD       Important Information About Raymond Gurney, Floyd.D., Floyd.S. Interventional Cardiologist  [facility]  Pager # [phone] Phone # [phone] [address] [place], [place] [address]   Thank you for choosing [facility] [place]!!

## 2021-12-14 NOTE — Assessment & Plan Note (Addendum)
LDL as of May 31 was 31 with TG 174.  Based on his symptoms, I had him increase his rosuvastatin from 20 to 40 mg daily.  Can reassess FLP in BMP in 2 to 3 months prior to follow-up. Plan: Continue with 40 mg atorvastatin.

## 2021-12-14 NOTE — Assessment & Plan Note (Signed)
Well-controlled blood pressure.  He is on low-dose amlodipine which is potentially beneficial for microvascular disease, will continue that.  But I think he can discontinue Imdur.  Tolerating bisoprolol.  Also for CAD.  HCTZ

## 2021-12-14 NOTE — Patient Instructions (Addendum)
Medication Instructions:  Stop taking Imdur   *If you need a refill on your cardiac medications before your next appointment, please call your pharmacy*   Lab Work: in Oct 2023  Lipid  liver function panel    If you have labs (blood work) drawn today and your tests are completely normal, you will receive your results only by: MyChart Message (if you have MyChart) OR A paper copy in the mail If you have any lab test that is abnormal or we need to change your treatment, we will call you to review the results.   Testing/Procedures: Will be schedule at [facility] [address] Your physician has requested that you have an echocardiogram. Echocardiography is a painless test that uses sound waves to create images of your heart. It provides your doctor with information about the size and shape of your heart and how well your heart's chambers and valves are working. This procedure takes approximately one hour. There are no restrictions for this procedure.    Follow-Up: At [facility], you and your health needs are our priority.  As part of our continuing mission to provide you with exceptional heart care, we have created designated Provider Care Teams.  These Care Teams include your primary Cardiologist (physician) and Advanced Practice Providers (APPs -  Physician Assistants and Nurse Practitioners) who all work together to provide you with the care you need, when you need it.  We recommend signing up for the patient portal called "MyChart".  Sign up information is provided on this After Visit Summary.  MyChart is used to connect with patients for Virtual Visits (Telemedicine).  Patients are able to view lab/test results, encounter notes, upcoming appointments, etc.  Non-urgent messages can be sent to your provider as well.   To learn more about what you can do with MyChart, go to [email or web address].    Your next appointment:   3 month(s)  The format for your next  appointment:   In Person  Provider:   Bryan Lemma, MD       Important Information About S

## 2021-12-14 NOTE — Assessment & Plan Note (Signed)
The current chest discomfort it to me sounds very unlikely be cardiac in nature, the previous episode was much more concerning.  Thankfully, he does not have significant disease on cath.  I think this is probably musculoskeletal, especially now has some reproducible symptoms.  I am still little worried though with regional wall motion abnormality suggested on LV gram. Plan: Check 2D echo

## 2021-12-14 NOTE — Assessment & Plan Note (Signed)
Smoking cessation instruction/counseling given:  counseled patient on the dangers of tobacco use, advised patient to stop smoking, and reviewed strategies to maximize success; he did not seem all that interested in hearing.

## 2021-12-14 NOTE — Assessment & Plan Note (Signed)
Thankfully, his cath did not show any evidence of significant CAD.  He had RFR negative lesion in the circumflex.  This was the only potentially concerning lesion.  Cannot exclude microvascular disease.  My concern was that he had had a prior infarct. Perhaps the only concerning feature was the fact that there is anterior wall motion normality noted on LV gram.  Plan: Recommend TTE based on LV gram findings.

## 2021-12-15 ENCOUNTER — Ambulatory Visit: Payer: 59 | Admitting: Pharmacist

## 2021-12-19 ENCOUNTER — Other Ambulatory Visit: Payer: Self-pay | Admitting: Physician Assistant

## 2021-12-24 ENCOUNTER — Ambulatory Visit: Payer: 59 | Admitting: Pharmacist

## 2021-12-24 ENCOUNTER — Telehealth: Payer: Self-pay | Admitting: Pharmacist

## 2021-12-24 ENCOUNTER — Other Ambulatory Visit: Payer: Self-pay | Admitting: Physician Assistant

## 2021-12-24 NOTE — Telephone Encounter (Signed)
Com[employer] insurance Unable to assist patient  Trelegy samples left up front

## 2021-12-28 ENCOUNTER — Other Ambulatory Visit: Payer: Self-pay | Admitting: Physician Assistant

## 2021-12-29 ENCOUNTER — Other Ambulatory Visit: Payer: Self-pay | Admitting: *Deleted

## 2021-12-29 MED ORDER — OMEPRAZOLE 20 MG PO CPDR: 20.0000 mg | DELAYED_RELEASE_CAPSULE | Freq: Every day | ORAL | 1 refills | Status: DC

## 2021-12-29 MED ORDER — GABAPENTIN 600 MG PO TABS: 300.0000 mg | ORAL_TABLET | Freq: Two times a day (BID) | ORAL | 1 refills | Status: DC

## 2021-12-30 ENCOUNTER — Ambulatory Visit (HOSPITAL_COMMUNITY): Payer: 59 | Attending: Cardiology

## 2021-12-30 DIAGNOSIS — I10 Essential (primary) hypertension: Secondary | ICD-10-CM | POA: Insufficient documentation

## 2021-12-30 DIAGNOSIS — R9389 Abnormal findings on diagnostic imaging of other specified body structures: Secondary | ICD-10-CM | POA: Insufficient documentation

## 2021-12-30 DIAGNOSIS — R072 Precordial pain: Secondary | ICD-10-CM | POA: Insufficient documentation

## 2021-12-30 DIAGNOSIS — R5382 Chronic fatigue, unspecified: Secondary | ICD-10-CM | POA: Insufficient documentation

## 2021-12-30 DIAGNOSIS — R079 Chest pain, unspecified: Secondary | ICD-10-CM | POA: Diagnosis not present

## 2021-12-30 DIAGNOSIS — R0609 Other forms of dyspnea: Secondary | ICD-10-CM | POA: Diagnosis not present

## 2021-12-30 LAB — ECHOCARDIOGRAM COMPLETE
Area-P 1/2: 4.12 cm2
S' Lateral: 3.1 cm

## 2022-01-07 ENCOUNTER — Ambulatory Visit (INDEPENDENT_AMBULATORY_CARE_PROVIDER_SITE_OTHER): Payer: 59 | Admitting: Gastroenterology

## 2022-01-23 ENCOUNTER — Other Ambulatory Visit: Payer: Self-pay | Admitting: Physician Assistant

## 2022-01-25 ENCOUNTER — Other Ambulatory Visit: Payer: Self-pay | Admitting: Family Medicine

## 2022-01-25 ENCOUNTER — Telehealth: Payer: Self-pay | Admitting: Family Medicine

## 2022-01-25 DIAGNOSIS — G8929 Other chronic pain: Secondary | ICD-10-CM

## 2022-01-25 MED ORDER — METFORMIN HCL 1000 MG PO TABS: 1000.0000 mg | ORAL_TABLET | Freq: Two times a day (BID) | ORAL | 1 refills | Status: DC

## 2022-01-25 NOTE — Telephone Encounter (Signed)
  Prescription Request  01/25/2022  Is this a "Controlled Substance" medicine? no  Have you seen your PCP in the last 2 weeks? no  If YES, route message to pool  -  If NO, patient needs to be scheduled for appointment.  What is the name of the medication or equipment? Metformin 1000 mg  Have you contacted your pharmacy to request a refill? yes   Which pharmacy would you like this sent to? [facility]   Patient notified that their request is being sent to the clinical staff for review and that they should receive a response within 2 business days.

## 2022-01-26 NOTE — Telephone Encounter (Signed)
Spouse calling back to see if we have samples of Trelegy. Patient is out.

## 2022-01-26 NOTE — Telephone Encounter (Signed)
Contacted spouse , she is on her way to get samples 2 

## 2022-02-15 ENCOUNTER — Telehealth: Payer: Self-pay | Admitting: Family Medicine

## 2022-02-20 ENCOUNTER — Other Ambulatory Visit: Payer: Self-pay | Admitting: Family Medicine

## 2022-02-20 DIAGNOSIS — G8929 Other chronic pain: Secondary | ICD-10-CM

## 2022-03-03 ENCOUNTER — Encounter (INDEPENDENT_AMBULATORY_CARE_PROVIDER_SITE_OTHER): Payer: Self-pay | Admitting: Gastroenterology

## 2022-03-08 ENCOUNTER — Encounter: Payer: Self-pay | Admitting: Family Medicine

## 2022-03-08 ENCOUNTER — Ambulatory Visit (INDEPENDENT_AMBULATORY_CARE_PROVIDER_SITE_OTHER): Payer: 59 | Admitting: Family Medicine

## 2022-03-08 VITALS — BP 108/61 | HR 66 | Temp 97.8°F | Ht 67.0 in | Wt 158.1 lb

## 2022-03-08 DIAGNOSIS — E1165 Type 2 diabetes mellitus with hyperglycemia: Secondary | ICD-10-CM | POA: Insufficient documentation

## 2022-03-08 DIAGNOSIS — J449 Chronic obstructive pulmonary disease, unspecified: Secondary | ICD-10-CM | POA: Diagnosis not present

## 2022-03-08 DIAGNOSIS — Z1211 Encounter for screening for malignant neoplasm of colon: Secondary | ICD-10-CM

## 2022-03-08 DIAGNOSIS — E1159 Type 2 diabetes mellitus with other circulatory complications: Secondary | ICD-10-CM

## 2022-03-08 DIAGNOSIS — L84 Corns and callosities: Secondary | ICD-10-CM | POA: Diagnosis not present

## 2022-03-08 DIAGNOSIS — K219 Gastro-esophageal reflux disease without esophagitis: Secondary | ICD-10-CM | POA: Diagnosis not present

## 2022-03-08 DIAGNOSIS — I25110 Atherosclerotic heart disease of native coronary artery with unstable angina pectoris: Secondary | ICD-10-CM | POA: Diagnosis not present

## 2022-03-08 DIAGNOSIS — I152 Hypertension secondary to endocrine disorders: Secondary | ICD-10-CM | POA: Diagnosis not present

## 2022-03-08 DIAGNOSIS — E1140 Type 2 diabetes mellitus with diabetic neuropathy, unspecified: Secondary | ICD-10-CM

## 2022-03-08 DIAGNOSIS — E7849 Other hyperlipidemia: Secondary | ICD-10-CM | POA: Diagnosis not present

## 2022-03-08 DIAGNOSIS — E785 Hyperlipidemia, unspecified: Secondary | ICD-10-CM | POA: Diagnosis not present

## 2022-03-08 DIAGNOSIS — E1169 Type 2 diabetes mellitus with other specified complication: Secondary | ICD-10-CM | POA: Diagnosis not present

## 2022-03-08 LAB — BAYER DCA HB A1C WAIVED: HB A1C (BAYER DCA - WAIVED): 7.1 % — ABNORMAL HIGH (ref 4.8–5.6)

## 2022-03-08 MED ORDER — DAPAGLIFLOZIN PROPANEDIOL 5 MG PO TABS: 5.0000 mg | ORAL_TABLET | Freq: Every day | ORAL | 1 refills | Status: DC

## 2022-03-08 MED ORDER — GABAPENTIN 600 MG PO TABS: ORAL_TABLET | ORAL | 1 refills | Status: DC

## 2022-03-08 NOTE — Patient Instructions (Signed)
Diabetes Mellitus and Foot Care Foot care is an important part of your health, especially when you have diabetes. Diabetes may cause you to have problems because of poor blood flow (circulation) to your feet and legs, which can cause your skin to: Become thinner and drier. Break more easily. Heal more slowly. Peel and crack. You may also have nerve damage (neuropathy) in your legs and feet, causing decreased feeling in them. This means that you may not notice minor injuries to your feet that could lead to more serious problems. Noticing and addressing any potential problems early is the best way to prevent future foot problems. How to care for your feet Foot hygiene  Wash your feet daily with warm water and mild soap. Do not use hot water. Then, pat your feet and the areas between your toes until they are completely dry. Do not soak your feet as this can dry your skin. Trim your toenails straight across. Do not dig under them or around the cuticle. File the edges of your nails with an emery board or nail file. Apply a moisturizing lotion or petroleum jelly to the skin on your feet and to dry, brittle toenails. Use lotion that does not contain alcohol and is unscented. Do not apply lotion between your toes. Shoes and socks Wear clean socks or stockings every day. Make sure they are not too tight. Do not wear knee-high stockings since they may decrease blood flow to your legs. Wear shoes that fit properly and have enough cushioning. Always look in your shoes before you put them on to be sure there are no objects inside. To break in new shoes, wear them for just a few hours a day. This prevents injuries on your feet. Wounds, scrapes, corns, and calluses  Check your feet daily for blisters, cuts, bruises, sores, and redness. If you cannot see the bottom of your feet, use a mirror or ask someone for help. Do not cut corns or calluses or try to remove them with medicine. If you find a minor scrape,  cut, or break in the skin on your feet, keep it and the skin around it clean and dry. You may clean these areas with mild soap and water. Do not clean the area with peroxide, alcohol, or iodine. If you have a wound, scrape, corn, or callus on your foot, look at it several times a day to make sure it is healing and not infected. Check for: Redness, swelling, or pain. Fluid or blood. Warmth. Pus or a bad smell. General tips Do not cross your legs. This may decrease blood flow to your feet. Do not use heating pads or hot water bottles on your feet. They may burn your skin. If you have lost feeling in your feet or legs, you may not know this is happening until it is too late. Protect your feet from hot and cold by wearing shoes, such as at the beach or on hot pavement. Schedule a complete foot exam at least once a year (annually) or more often if you have foot problems. Report any cuts, sores, or bruises to your health care provider immediately. Where to find more information American Diabetes Association: www.diabetes.org Association of Diabetes Care & Education Specialists: www.diabeteseducator.org Contact a health care provider if: You have a medical condition that increases your risk of infection and you have any cuts, sores, or bruises on your feet. You have an injury that is not healing. You have redness on your legs or feet. You   feel burning or tingling in your legs or feet. You have pain or cramps in your legs and feet. Your legs or feet are numb. Your feet always feel cold. You have pain around any toenails. Get help right away if: You have a wound, scrape, corn, or callus on your foot and: You have pain, swelling, or redness that gets worse. You have fluid or blood coming from the wound, scrape, corn, or callus. Your wound, scrape, corn, or callus feels warm to the touch. You have pus or a bad smell coming from the wound, scrape, corn, or callus. You have a fever. You have a red  line going up your leg. Summary Check your feet every day for blisters, cuts, bruises, sores, and redness. Apply a moisturizing lotion or petroleum jelly to the skin on your feet and to dry, brittle toenails. Wear shoes that fit properly and have enough cushioning. If you have foot problems, report any cuts, sores, or bruises to your health care provider immediately. Schedule a complete foot exam at least once a year (annually) or more often if you have foot problems. This information is not intended to replace advice given to you by your health care provider. Make sure you discuss any questions you have with your health care provider. Document Revised: 11/22/2019 Document Reviewed: 11/22/2019 Elsevier Patient Education  2023 Elsevier Inc.  

## 2022-03-08 NOTE — Progress Notes (Signed)
Established Patient Office Visit  Subjective   Patient ID: Larry Floyd, male    DOB: [birth year]  Age: 61 y.o. MRN: [id number]  Chief Complaint  Patient presents with   Medical Management of Chronic Issues   Diabetes   Hypertension    COPD He complains of cough, shortness of breath (with acitivy, at baseline), sputum production (sometimes) and wheezing. There is no hemoptysis. This is a chronic problem. The problem occurs 2 to 4 times per day. The problem has been unchanged. Associated symptoms include dyspnea on exertion. Pertinent negatives include no chest pain, fever, headaches, heartburn or weight loss. His symptoms are aggravated by exercise. Relieved by: trelegy, albuterol. He reports moderate improvement on treatment. Risk factors for lung disease include smoking/tobacco exposure. His past medical history is significant for COPD.  Diabetes He presents for his follow-up diabetic visit. He has type 2 diabetes mellitus. Pertinent negatives for hypoglycemia include no headaches. Pertinent negatives for diabetes include no blurred vision, no chest pain, no weakness and no weight loss. Symptoms are stable. Diabetic complications include peripheral neuropathy. Current diabetic treatment includes oral agent (dual therapy) (metformin, januvia). He is compliant with treatment all of the time. Meal planning includes avoidance of concentrated sweets. An ACE inhibitor/angiotensin II receptor blocker is being taken. He does not see a podiatrist.Eye exam is not current.  Hypertension This is a chronic problem. The problem is controlled. Associated symptoms include shortness of breath (with acitivy, at baseline). Pertinent negatives include no blurred vision, chest pain, headaches, neck pain, orthopnea, palpitations or peripheral edema. Past treatments include calcium channel blockers, diuretics and beta blockers. Hypertensive end-organ damage includes angina and CAD/MI.   He has a follow up appt  with cardiology next month. They did an echo recently that was normal. He also had some labs done today for cardiology.   Past Medical History:  Diagnosis Date   Arthritis    Bronchitis    Cataract    Diabetes mellitus without complication (HCC)    Emphysema lung (HCC)    GERD (gastroesophageal reflux disease)    Hypercholesteremia    Hypertension       Review of Systems  Constitutional:  Negative for chills, fever and weight loss.  Eyes:  Negative for blurred vision.  Respiratory:  Positive for cough, sputum production (sometimes), shortness of breath (with acitivy, at baseline) and wheezing. Negative for hemoptysis.   Cardiovascular:  Positive for dyspnea on exertion. Negative for chest pain, palpitations, orthopnea and leg swelling.  Gastrointestinal:  Negative for abdominal pain, heartburn, nausea and vomiting.  Musculoskeletal:  Negative for falls and neck pain.  Neurological:  Negative for sensory change, speech change, focal weakness, weakness and headaches.  Psychiatric/Behavioral:  Negative for depression.        Objective:     BP 108/61   Pulse 66   Temp 97.8 F (36.6 C) (Temporal)   Ht 5\' 7"  (1.702 m)   Wt 158 lb 2 oz (71.7 kg)   SpO2 96%   BMI 24.77 kg/m  BP Readings from Last 3 Encounters:  03/08/22 108/61  12/14/21 120/64  12/03/21 116/72      Physical Exam Vitals and nursing note reviewed.  Constitutional:      General: He is not in acute distress.    Appearance: He is not ill-appearing, toxic-appearing or diaphoretic.  Neck:     Vascular: No carotid bruit.  Cardiovascular:     Rate and Rhythm: Normal rate and regular rhythm.  Heart sounds: Normal heart sounds. No murmur heard. Pulmonary:     Effort: Pulmonary effort is normal. No respiratory distress.     Breath sounds: Normal breath sounds.  Musculoskeletal:     Right lower leg: No edema.     Left lower leg: No edema.  Lymphadenopathy:     Cervical: No cervical adenopathy.  Skin:     General: Skin is warm and dry.  Neurological:     General: No focal deficit present.     Mental Status: He is alert and oriented to person, place, and time.  Psychiatric:        Mood and Affect: Mood normal.        Behavior: Behavior normal.    Diabetic Foot Exam - Simple   Simple Foot Form Diabetic Foot exam was performed with the following findings: Yes 03/08/2022 12:40 PM  Visual Inspection See comments: Yes Sensation Testing Intact to touch and monofilament testing bilaterally: Yes Pulse Check Posterior Tibialis and Dorsalis pulse intact bilaterally: Yes Comments Calluses bilaterally, with a large callus on left great toe. No signs of infection     No results found for any visits on 03/08/22.    The 10-year ASCVD risk score (Arnett DK, et al., 2019) is: 23.2%    Assessment & Plan:   Jaiceon was seen today for medical management of chronic issues, diabetes and hypertension.  Diagnoses and all orders for this visit:  Type 2 diabetes mellitus with diabetic neuropathy, without long-term current use of insulin (HCC) A1c 7.1 today, not at goal of < 7. Continue metformin and januvia. Add fariga. Referral to podiatry today for calluses. Increase gabapentin at bedtime. On ACE and statin.  -     Bayer DCA Hb A1c Waived -     Microalbumin / creatinine urine ratio -     dapagliflozin propanediol (FARXIGA) 5 MG TABS tablet; Take 1 tablet (5 mg total) by mouth daily before breakfast. -     gabapentin (NEURONTIN) 600 MG tablet; Take 0.5 tablet by mouth in the morning then take a full tablet by mouth at bedtime. -     Ambulatory referral to Podiatry  Hypertension associated with diabetes (HCC) Well controlled on current regimen. Continue norvasc, HCTZ, and bisoprolol.  Hyperlipidemia associated with type 2 diabetes mellitus (HCC) Well controlled on current regimen. On atorvastatin.   Chronic obstructive pulmonary disease, unspecified COPD type (HCC) Stable with trelegy.  Declined referral to pulmonology today.   Gastroesophageal reflux disease without esophagitis Return to office for new or worsening symptoms, or if symptoms persist.   Coronary artery disease involving native coronary artery of native heart with unstable angina pectoris (HCC) On lipitor and aspirin.   Colon cancer screening -     Ambulatory referral to Gastroenterology  Callus of foot -     Ambulatory referral to Podiatry  Return in about 3 months (around 06/08/2022) for chronic follow up.  The patient indicates understanding of these issues and agrees with the plan.   Gabriel Earing, FNP

## 2022-03-09 LAB — HEPATIC FUNCTION PANEL
ALT: 19 IU/L (ref 0–44)
AST: 18 IU/L (ref 0–40)
Albumin: 4.8 g/dL (ref 3.9–4.9)
Alkaline Phosphatase: 106 IU/L (ref 44–121)
Bilirubin Total: <0.2 mg/dL (ref 0.0–1.2)
Bilirubin, Direct: <0.1 mg/dL (ref 0.00–0.40)
Total Protein: 7.6 g/dL (ref 6.0–8.5)

## 2022-03-09 LAB — LIPID PANEL
Chol/HDL Ratio: 3.3 ratio (ref 0.0–5.0)
Cholesterol, Total: 124 mg/dL (ref 100–199)
HDL: 38 mg/dL — ABNORMAL LOW (ref >39)
LDL Chol Calc (NIH): 53 mg/dL (ref 0–99)
Triglycerides: 199 mg/dL — ABNORMAL HIGH (ref 0–149)
VLDL Cholesterol Cal: 33 mg/dL (ref 5–40)

## 2022-03-10 ENCOUNTER — Encounter: Payer: Self-pay | Admitting: *Deleted

## 2022-03-10 LAB — MICROALBUMIN / CREATININE URINE RATIO
Creatinine, Urine: 65.3 mg/dL
Microalb/Creat Ratio: 6 mg/g creat (ref 0–29)
Microalbumin, Urine: 3.9 ug/mL

## 2022-03-19 ENCOUNTER — Telehealth: Payer: Self-pay | Admitting: *Deleted

## 2022-03-19 MED ORDER — FISH OIL 1000 MG PO CPDR: 1000.0000 mg | DELAYED_RELEASE_CAPSULE | Freq: Every day | ORAL | 11 refills | Status: DC

## 2022-03-19 NOTE — Telephone Encounter (Signed)
The patient has been notified of the result and verbalized understanding.  All questions (if any) were answered.  Medication added to list  Raiford Simmonds, RN 03/19/2022 2:55 PM

## 2022-03-19 NOTE — Telephone Encounter (Signed)
-----   Message from Leonie Man, MD sent at 03/13/2022  2:34 PM EDT ----- Cholesterol levels have shown an improvement with exception of triglycerides.  A total partial down from 154-now 124; HDL (good cholesterol) is up to 38, and LDL (bad cholesterol is down to 53 from 89.  Triglycerides are just a little bit elevated at 199, but it previously been in the 200 range 8 months ago.  Overall looks good.  Would recommend over-the-counter fish oil (or Krill oil) tablets to help for the triglycerides -> if tolerated work up to taking 3000 mg daily   Glenetta Hew, MD

## 2022-03-21 ENCOUNTER — Other Ambulatory Visit: Payer: Self-pay | Admitting: Family Medicine

## 2022-03-21 DIAGNOSIS — G8929 Other chronic pain: Secondary | ICD-10-CM

## 2022-03-25 ENCOUNTER — Ambulatory Visit (INDEPENDENT_AMBULATORY_CARE_PROVIDER_SITE_OTHER): Payer: 59 | Admitting: Gastroenterology

## 2022-03-29 ENCOUNTER — Ambulatory Visit: Payer: 59 | Admitting: Cardiology

## 2022-03-30 ENCOUNTER — Telehealth: Payer: Self-pay

## 2022-03-30 ENCOUNTER — Telehealth: Payer: Self-pay | Admitting: Family Medicine

## 2022-03-30 NOTE — Telephone Encounter (Signed)
Pt wants to know if nurse can check to see if we have any samples of Trelegy that she can give him? He is out.

## 2022-03-30 NOTE — Telephone Encounter (Signed)
Pt needs to reschedule his appt for another day. Pt can only be here from 2pm and after. I cancelled the one he had for tomorrow 03/31/22.

## 2022-03-30 NOTE — Telephone Encounter (Signed)
Wife aware samples ready for [place]

## 2022-03-31 ENCOUNTER — Ambulatory Visit: Payer: 59 | Admitting: Gastroenterology

## 2022-03-31 ENCOUNTER — Encounter: Payer: Self-pay | Admitting: Gastroenterology

## 2022-04-13 ENCOUNTER — Telehealth: Payer: Self-pay | Admitting: Family Medicine

## 2022-04-13 NOTE — Telephone Encounter (Signed)
Referred to Dr Ulice Brilliant, they do not take insurance and need referral sent to an office that does.

## 2022-04-24 ENCOUNTER — Other Ambulatory Visit: Payer: Self-pay | Admitting: Family Medicine

## 2022-04-24 ENCOUNTER — Other Ambulatory Visit: Payer: Self-pay | Admitting: Physician Assistant

## 2022-04-24 DIAGNOSIS — G8929 Other chronic pain: Secondary | ICD-10-CM

## 2022-04-26 ENCOUNTER — Other Ambulatory Visit: Payer: Self-pay | Admitting: Physician Assistant

## 2022-04-28 NOTE — Telephone Encounter (Signed)
Patient had recent MI- NSAIDs not recommended.

## 2022-05-01 ENCOUNTER — Other Ambulatory Visit: Payer: Self-pay | Admitting: Physician Assistant

## 2022-05-08 ENCOUNTER — Other Ambulatory Visit: Payer: Self-pay | Admitting: Family Medicine

## 2022-05-08 DIAGNOSIS — I10 Essential (primary) hypertension: Secondary | ICD-10-CM

## 2022-05-08 DIAGNOSIS — E1165 Type 2 diabetes mellitus with hyperglycemia: Secondary | ICD-10-CM

## 2022-05-11 ENCOUNTER — Telehealth: Payer: Self-pay | Admitting: Cardiology

## 2022-05-11 NOTE — Telephone Encounter (Signed)
Returned call to patients wife (okay per DPR)- who was calling in regard to patients Echo results. Per patients wife she had to cancel appointment on 12/28 and states that they are still waiting on Echo results. Advised them that per chart review-   Runell Gess, MD 01/03/2022 10:45 AM EDT     Essentially normal study. Repeat when clinically indicated.   Patients wife would like to know if Dr. Herbie Baltimore has any advice or recommendations based off of these results. Patients wife also states patient has done well over all reports a couple slight instances of chest pain- none at current, no associated shortness of breath or any other issues, reports patient is compliant with medications. Patients wife rescheduled follow up for June of 2024. Will forward to MD for review and advice.

## 2022-05-11 NOTE — Telephone Encounter (Signed)
Patient's wife is calling requesting they be called to discuss pt's echo results due to having to cancel 12/28 appt and not being able to reschedule with Dr. Herbie Baltimore until June. Please advise.

## 2022-05-12 ENCOUNTER — Telehealth: Payer: Self-pay | Admitting: Family Medicine

## 2022-05-12 ENCOUNTER — Other Ambulatory Visit: Payer: Self-pay | Admitting: Physician Assistant

## 2022-05-12 NOTE — Telephone Encounter (Signed)
Patient's wife calling for Trelegy samples

## 2022-05-13 ENCOUNTER — Ambulatory Visit: Payer: 59 | Admitting: Gastroenterology

## 2022-05-13 ENCOUNTER — Ambulatory Visit: Payer: 59 | Admitting: Cardiology

## 2022-05-13 NOTE — Telephone Encounter (Signed)
Can't leave message samples up front

## 2022-05-17 NOTE — Telephone Encounter (Signed)
Basically the echocardiogram was completely normal.  I did not confirm though potential wall motion abnormality I saw LV gram.  The heart looks like is doing well.  The heart catheterization was relatively benign and reassuring.  I think for now we can safely presume that he is coronary disease is stable and nonobstructive.  We need to continue to treat risk factors.  Continue blood pressure, cholesterol and blood sugar control.  Continue be active and exercise.  If symptoms worsen, we can try to get him in before the June 2024 appointment.  Glenetta Hew, MD

## 2022-05-19 NOTE — Telephone Encounter (Signed)
Returned call to patients wife and made her aware of  Dr. Allison Quarry recommendations advised her to let us know if there are any issues, questions, or concerns that may arise. Patients wife verbalized understanding.

## 2022-05-20 ENCOUNTER — Ambulatory Visit: Payer: 59 | Admitting: Internal Medicine

## 2022-05-26 ENCOUNTER — Ambulatory Visit: Payer: 59 | Admitting: Internal Medicine

## 2022-06-09 ENCOUNTER — Encounter: Payer: Self-pay | Admitting: Family Medicine

## 2022-06-09 ENCOUNTER — Ambulatory Visit (INDEPENDENT_AMBULATORY_CARE_PROVIDER_SITE_OTHER): Payer: 59 | Admitting: Family Medicine

## 2022-06-09 VITALS — BP 137/80 | HR 66 | Temp 98.0°F | Ht 67.0 in | Wt 153.4 lb

## 2022-06-09 DIAGNOSIS — E1159 Type 2 diabetes mellitus with other circulatory complications: Secondary | ICD-10-CM

## 2022-06-09 DIAGNOSIS — F17200 Nicotine dependence, unspecified, uncomplicated: Secondary | ICD-10-CM

## 2022-06-09 DIAGNOSIS — E785 Hyperlipidemia, unspecified: Secondary | ICD-10-CM | POA: Diagnosis not present

## 2022-06-09 DIAGNOSIS — I25110 Atherosclerotic heart disease of native coronary artery with unstable angina pectoris: Secondary | ICD-10-CM

## 2022-06-09 DIAGNOSIS — I2510 Atherosclerotic heart disease of native coronary artery without angina pectoris: Secondary | ICD-10-CM | POA: Insufficient documentation

## 2022-06-09 DIAGNOSIS — E1169 Type 2 diabetes mellitus with other specified complication: Secondary | ICD-10-CM | POA: Diagnosis not present

## 2022-06-09 DIAGNOSIS — J449 Chronic obstructive pulmonary disease, unspecified: Secondary | ICD-10-CM

## 2022-06-09 DIAGNOSIS — K219 Gastro-esophageal reflux disease without esophagitis: Secondary | ICD-10-CM

## 2022-06-09 DIAGNOSIS — R195 Other fecal abnormalities: Secondary | ICD-10-CM | POA: Diagnosis not present

## 2022-06-09 DIAGNOSIS — R69 Illness, unspecified: Secondary | ICD-10-CM | POA: Diagnosis not present

## 2022-06-09 DIAGNOSIS — I152 Hypertension secondary to endocrine disorders: Secondary | ICD-10-CM

## 2022-06-09 DIAGNOSIS — E1140 Type 2 diabetes mellitus with diabetic neuropathy, unspecified: Secondary | ICD-10-CM | POA: Diagnosis not present

## 2022-06-09 LAB — BAYER DCA HB A1C WAIVED: HB A1C (BAYER DCA - WAIVED): 7 % — ABNORMAL HIGH (ref 4.8–5.6)

## 2022-06-09 NOTE — Progress Notes (Signed)
Established Patient Office Visit  Subjective   Patient ID: Larry Floyd, male    DOB: 1960-05-27  Age: 62 y.o. MRN: [id number]  Chief Complaint  Patient presents with   Medical Management of Chronic Issues   Diabetes    Diabetes   T2DM Pt presents for follow up evaluation of Type 2 diabetes mellitus. Patient denies foot ulcerations, increased appetite, nausea, paresthesia of the feet, polydipsia, polyuria, visual disturbances, vomiting, and weight loss.  Current diabetic medications include farxiga 5 mg, januvia 100 mg, metformin 100 mg BID Compliant with meds - Yes  Current monitoring regimen: none Current diet: in general, an "unhealthy" diet Current exercise: none  Urine microalbumin UTD? Yes Is He on ACE inhibitor or angiotensin II receptor blocker?  No, declines Is He on statin? Yes atorvastatin Is He on ASA 81 mg daily?  Yes  2. HTN Complaint with meds - Yes Current Medications - amlodipine, bisoprolol, HCTZ Checking BP at home ranging 130s/80s Pertinent ROS:  Headache - No Fatigue - baseline Visual Disturbances - No Chest pain - occasional for a few minutes Dyspnea - baseline Palpitations - No LE edema - No  He has a follow up appt in June with cardiology.   3. COPD Uses trelegy daily. Uses albuterol at least BID. Chronic cough with sputum production at baseline. Wheezing a few times throughout the day. He feels short of breath with minimal activity at baseline.   4. GERD Compliant with medications - Yes Current medications - omeprazole 20 mg daily Voice change - No Hemoptysis - No Dysphagia or dyspepsia - No Water brash - No Red Flags (weight loss, hematochezia, melena, weight loss, early satiety, fevers, odynophagia, or persistent vomiting) - No    Past Medical History:  Diagnosis Date   Arthritis    Bronchitis    Cataract    Diabetes mellitus without complication (HCC)    Emphysema lung (HCC)    GERD (gastroesophageal reflux disease)     Hypercholesteremia    Hypertension       ROS As per HPI.    Objective:     BP 137/80   Pulse 66   Temp 98 F (36.7 C) (Temporal)   Ht 5\' 7"  (1.702 m)   Wt 153 lb 6 oz (69.6 kg)   SpO2 95%   BMI 24.02 kg/m    Physical Exam Vitals and nursing note reviewed.  Constitutional:      General: He is not in acute distress.    Appearance: He is not ill-appearing, toxic-appearing or diaphoretic.  Cardiovascular:     Rate and Rhythm: Normal rate and regular rhythm.     Heart sounds: Normal heart sounds. No murmur heard. Pulmonary:     Effort: Pulmonary effort is normal. No respiratory distress.     Breath sounds: Normal breath sounds. No wheezing, rhonchi or rales.  Abdominal:     General: Bowel sounds are normal. There is no distension.     Palpations: Abdomen is soft.     Tenderness: There is no abdominal tenderness. There is no guarding or rebound.  Musculoskeletal:     Cervical back: Neck supple. No rigidity.     Right lower leg: No edema.     Left lower leg: No edema.  Skin:    General: Skin is warm and dry.  Neurological:     General: No focal deficit present.     Mental Status: He is alert and oriented to person, place, and time.  Psychiatric:  Mood and Affect: Mood normal.        Behavior: Behavior normal.      No results found for any visits on 06/09/22.    The ASCVD Risk score (Arnett DK, et al., 2019) failed to calculate for the following reasons:   The valid total cholesterol range is 130 to 320 mg/dL    Assessment & Plan:   Jd was seen today for medical management of chronic issues and diabetes.  Diagnoses and all orders for this visit:  Type 2 diabetes mellitus with diabetic neuropathy, without long-term current use of insulin (HCC) A1c is 7.0 today, not quite at goal of <7. Discussed compliance with diet. Continue metformin, januvia, and farxiga. Labs pending.  -     Bayer DCA Hb A1c Waived -     Vitamin B12  Hypertension  associated with diabetes ([place]) Well controlled on current regimen. Continue amlodipine, bisoprolol, and HCTZ.   Hyperlipidemia associated with type 2 diabetes mellitus ([place]) On statin. Last LDL was 53.  Coronary artery disease involving native coronary artery of native heart with unstable angina pectoris [facility]) Managed by cardiology. On bisoprolol, aspirin, amlodipine, statin.   Gastroesophageal reflux disease without esophagitis Well controlled on current regimen.   Chronic obstructive pulmonary disease, unspecified COPD type ([place]) Uncontrolled. Compliant with trelegt. Referral to pulmonology for further management.  -     Ambulatory referral to Pulmonology  Tobacco use disorder Lung cancer screening discussed and ordered.  -     CT CHEST LUNG CA SCREEN LOW DOSE W/O CM; Future  Positive colorectal cancer screening using Cologuard test Discussed in detail, however he has declined referral for colonoscopy.  Return in about 3 months (around 09/08/2022) for chronic follow up.  The patient indicates understanding of these issues and agrees with the plan.  Gwenlyn Perking, FNP

## 2022-06-10 LAB — VITAMIN B12: Vitamin B-12: 234 pg/mL (ref 232–1245)

## 2022-06-15 ENCOUNTER — Other Ambulatory Visit: Payer: Self-pay

## 2022-06-15 DIAGNOSIS — E1140 Type 2 diabetes mellitus with diabetic neuropathy, unspecified: Secondary | ICD-10-CM

## 2022-06-22 ENCOUNTER — Other Ambulatory Visit: Payer: Self-pay | Admitting: Family Medicine

## 2022-06-22 ENCOUNTER — Telehealth: Payer: Self-pay | Admitting: Family Medicine

## 2022-06-24 NOTE — Telephone Encounter (Signed)
We do not carry [place] samples She can have farxiga 1 box If this is unaffordable on her current insurance, we need to need switch up her regimen.  We will not be able to provide samples continuously

## 2022-06-24 NOTE — Telephone Encounter (Signed)
WIFE AWARE

## 2022-07-02 ENCOUNTER — Other Ambulatory Visit: Payer: Self-pay | Admitting: Physician Assistant

## 2022-07-02 ENCOUNTER — Other Ambulatory Visit: Payer: Self-pay | Admitting: Cardiology

## 2022-07-02 ENCOUNTER — Other Ambulatory Visit: Payer: Self-pay | Admitting: Family Medicine

## 2022-07-02 DIAGNOSIS — G8929 Other chronic pain: Secondary | ICD-10-CM

## 2022-07-13 ENCOUNTER — Ambulatory Visit (INDEPENDENT_AMBULATORY_CARE_PROVIDER_SITE_OTHER): Payer: Self-pay | Admitting: Pulmonary Disease

## 2022-07-13 ENCOUNTER — Encounter (HOSPITAL_BASED_OUTPATIENT_CLINIC_OR_DEPARTMENT_OTHER): Payer: Self-pay | Admitting: Pulmonary Disease

## 2022-07-13 VITALS — BP 112/70 | HR 63 | Ht 67.0 in | Wt 153.0 lb

## 2022-07-13 DIAGNOSIS — F17200 Nicotine dependence, unspecified, uncomplicated: Secondary | ICD-10-CM

## 2022-07-13 DIAGNOSIS — J432 Centrilobular emphysema: Secondary | ICD-10-CM

## 2022-07-13 NOTE — Assessment & Plan Note (Signed)
We discussed smoking cessation is the most important intervention that would add years to his life he is now ready to commit to a quit date. We talked about not smoking in his bedroom as a start We will schedule low-dose CT scanning use this and PFTs to motivate him to quit

## 2022-07-13 NOTE — Patient Instructions (Signed)
X PFTs  X Low dose CT chest  You have to QUIT smoking !!

## 2022-07-13 NOTE — Assessment & Plan Note (Addendum)
PFTs will be scheduled to quantitate lung function. I have asked him to continue Trelegy. He can use albuterol for rescue.  His wife has a nebulizer with albuterol solution which he can use for an emergency. We discussed COPD action plan and signs and symptoms of COPD exacerbation

## 2022-07-13 NOTE — Progress Notes (Signed)
Subjective:    Patient ID: Larry Floyd, male    DOB: Apr 04, 1961, 62 y.o.   MRN: [id number]  HPI  62 year old heavy smoker presents for evaluation of COPD Reports a diagnosis of COPD for few years.  He smokes 3 packs/day starting as a teenager, more than 120 pack years.  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 He reports dyspnea on exertion especially on climbing stairs, improves with rest standing and resting. He reports cough productive of minimal Remund to yellow phlegm day and night.  He reports difficult to shake off a chest cold. He was given Trelegy by his PCP and not sure whether this helps much.  Albuterol seems to help when he uses 2 or 3 times daily Chest x-ray 09/2020 was reviewed which shows hyperinflation consistent with emphysema  PMH -diabetes type 2 CAD Hypertension   Past Medical History:  Diagnosis Date   Arthritis    Bronchitis    Cataract    Diabetes mellitus without complication (HCC)    Emphysema lung (HCC)    GERD (gastroesophageal reflux disease)    Hypercholesteremia    Hypertension    Past Surgical History:  Procedure Laterality Date   BACK SURGERY     Lumbar   EYE SURGERY Bilateral    implant insertion   HERNIA REPAIR     x2   INTRAVASCULAR PRESSURE WIRE/FFR STUDY N/A 11/25/2021   Procedure: INTRAVASCULAR PRESSURE WIRE/FFR STUDY;  Surgeon: Leonie Man, MD;  Location: [place] [facility];  Service: Cardiovascular;  Laterality: N/A;   LEFT HEART CATH AND CORONARY ANGIOGRAPHY N/A 11/25/2021   Procedure: LEFT HEART CATH AND CORONARY ANGIOGRAPHY;  Surgeon: Leonie Man, MD;  Location: [facility]::   Angiographically Mod 1 V CAD w/ Dist LCx 60% - RFR NEGATIVE lesion.   Prox RCA 20%.  Mid RCA 10%.  Prox LAD to Mid LAD 15% . Normal LV function-EF~50 to 55%, with mildly elevated LVEDP. LV- gram suggest possible anterior wall motion abnormality.  REC: TTE to better assess.    No Known Allergies  Social History    Socioeconomic History   Marital status: Married    Spouse name: Mariann Laster   Number of children: 1   Years of education: 7   Highest education level: 7th grade  Occupational History   Not on file  Tobacco Use   Smoking status: Every Day    Packs/day: 3.00    Years: 45.00    Total pack years: 135.00    Types: Cigarettes   Smokeless tobacco: Never   Tobacco comments:    Started smoking at 5  Vaping Use   Vaping Use: Never used  Substance and Sexual Activity   Alcohol use: Yes    Alcohol/week: 5.0 standard drinks of alcohol    Types: 5 Shots of liquor per week    Comment: drinks "a couple shots" one night a week   Drug use: No   Sexual activity: Yes    Birth control/protection: None  Other Topics Concern   Not on file  Social History Narrative   Relatively 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   Still smokes.   Social Determinants of Health   Financial Resource Strain: Not on file  Food Insecurity: Not on file  Transportation Needs: Not on file  Physical Activity: Not on file  Stress: Not on file  Social Connections: Not on file  Intimate Partner Violence: Not on file  Family History  Problem Relation Age of Onset   Stroke Mother    Hyperlipidemia Mother    Cancer Mother        throat cancer   Hypertension Mother    Atrial fibrillation Mother    Hypertension Father    Hyperlipidemia Father    COPD Father    Heart disease Father    AAA (abdominal aortic aneurysm) Father    Heart attack Father    Diabetes Sister    Heart murmur Sister    Hearing loss Daughter     Social History   Socioeconomic History   Marital status: Married    Spouse name: Mariann Laster   Number of children: 1   Years of education: 7   Highest education level: 7th grade  Occupational History   Not on file  Tobacco Use   Smoking status: Every Day    Packs/day: 3.00    Years: 45.00    Total pack years: 135.00    Types: Cigarettes   Smokeless tobacco: Never   Tobacco  comments:    Started smoking at 65  Vaping Use   Vaping Use: Never used  Substance and Sexual Activity   Alcohol use: Yes    Alcohol/week: 5.0 standard drinks of alcohol    Types: 5 Shots of liquor per week    Comment: drinks "a couple shots" one night a week   Drug use: No   Sexual activity: Yes    Birth control/protection: None  Other Topics Concern   Not on file  Social History Narrative   Relatively 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   Still smokes.   Social Determinants of Health   Financial Resource Strain: Not on file  Food Insecurity: Not on file  Transportation Needs: Not on file  Physical Activity: Not on file  Stress: Not on file  Social Connections: Not on file  Intimate Partner Violence: Not on file     Review of Systems Acid heartburn, indigestion Tooth problems Headaches and nasal congestion Anxiety Shortness of breath with activity, cough productive of minimal [place] phlegm    Objective:   Physical Exam  Gen. Pleasant, well-nourished, in no distress, normal affect ENT - no pallor,icterus, no post nasal drip Neck: No JVD, no thyromegaly, no carotid bruits Lungs: no use of accessory muscles, no dullness to percussion, decreased breath sounds bilateral without rales or rhonchi  Cardiovascular: Rhythm regular, heart sounds  normal, no murmurs or gallops, no peripheral edema Abdomen: soft and non-tender, no hepatosplenomegaly, BS normal. Musculoskeletal: No deformities, no cyanosis or clubbing Neuro:  alert, non focal       Assessment & Plan:

## 2022-07-13 NOTE — Addendum Note (Signed)
Addended by: Darliss Ridgel on: 07/13/2022 04:29 PM   Modules accepted: Orders

## 2022-07-24 ENCOUNTER — Other Ambulatory Visit: Payer: Self-pay | Admitting: Cardiology

## 2022-07-24 ENCOUNTER — Other Ambulatory Visit: Payer: Self-pay | Admitting: Family Medicine

## 2022-07-28 ENCOUNTER — Telehealth: Payer: Self-pay | Admitting: Family Medicine

## 2022-07-28 DIAGNOSIS — J449 Chronic obstructive pulmonary disease, unspecified: Secondary | ICD-10-CM

## 2022-07-28 MED ORDER — TRELEGY ELLIPTA 100-62.5-25 MCG/ACT IN AEPB: 1.0000 | INHALATION_SPRAY | Freq: Every day | RESPIRATORY_TRACT | 1 refills | Status: AC

## 2022-07-28 MED ORDER — ALBUTEROL SULFATE HFA 108 (90 BASE) MCG/ACT IN AERS: 2.0000 | INHALATION_SPRAY | Freq: Four times a day (QID) | RESPIRATORY_TRACT | 0 refills | Status: DC | PRN

## 2022-07-28 NOTE — Telephone Encounter (Signed)
  Prescription Request  07/28/2022  Is this a "Controlled Substance" medicine? No   Have you seen your PCP in the last 2 weeks? Has appt 04/24  If YES, route message to pool  -  If NO, patient needs to be scheduled for appointment.  What is the name of the medication or equipment? Trelegy samples and albuterol sent to [facility]  Have you contacted your pharmacy to request a refill? no   Which pharmacy would you like this sent to? [facility]   Patient notified that their request is being sent to the clinical staff for review and that they should receive a response within 2 business days.

## 2022-07-28 NOTE — Telephone Encounter (Signed)
NA/NVM samples of Trelegy at front desk. Albuterol sent to [facility]

## 2022-08-04 ENCOUNTER — Telehealth: Payer: Self-pay | Admitting: Pulmonary Disease

## 2022-08-04 MED ORDER — AZITHROMYCIN 250 MG PO TABS: ORAL_TABLET | ORAL | 0 refills | Status: AC

## 2022-08-04 MED ORDER — PREDNISONE 10 MG PO TABS: ORAL_TABLET | ORAL | 0 refills | Status: AC

## 2022-08-04 NOTE — Telephone Encounter (Signed)
Please advise Coughing real bad and stopped up and using saline nose spray. States was told to call office if he gets a cold and not take any over the counter stuff.

## 2022-08-04 NOTE — Telephone Encounter (Signed)
PT wife calling. Dr. Elsworth Soho said to call if he gets sick for a script, no over the counter meds. He is coughing, lots of phlegm and sleeplessness. Pls call wife.  [phone]  Pharm: [facility] in [facility]

## 2022-08-04 NOTE — Telephone Encounter (Signed)
Called and spoke to patients wife about recommendations and she voiced understanding.  Nothing further needed.  Medications sent to [facility].

## 2022-08-21 ENCOUNTER — Other Ambulatory Visit: Payer: Self-pay | Admitting: Family Medicine

## 2022-08-21 DIAGNOSIS — I10 Essential (primary) hypertension: Secondary | ICD-10-CM

## 2022-08-23 ENCOUNTER — Telehealth: Payer: Self-pay | Admitting: Family Medicine

## 2022-08-23 NOTE — Telephone Encounter (Signed)
Samples up front and pt's wife is aware.

## 2022-08-23 NOTE — Telephone Encounter (Signed)
Patient needs a sample of Trelegy, he is out. Please call back when it is ready or if we do not have any.

## 2022-08-26 ENCOUNTER — Encounter: Payer: Self-pay | Admitting: *Deleted

## 2022-09-04 ENCOUNTER — Other Ambulatory Visit: Payer: Self-pay | Admitting: Family Medicine

## 2022-09-04 DIAGNOSIS — E1165 Type 2 diabetes mellitus with hyperglycemia: Secondary | ICD-10-CM

## 2022-09-08 ENCOUNTER — Encounter: Payer: Self-pay | Admitting: Family Medicine

## 2022-09-08 ENCOUNTER — Ambulatory Visit (INDEPENDENT_AMBULATORY_CARE_PROVIDER_SITE_OTHER): Payer: Self-pay | Admitting: Family Medicine

## 2022-09-08 VITALS — BP 118/66 | HR 74 | Temp 97.7°F | Ht 67.0 in | Wt 150.5 lb

## 2022-09-08 DIAGNOSIS — I152 Hypertension secondary to endocrine disorders: Secondary | ICD-10-CM

## 2022-09-08 DIAGNOSIS — E785 Hyperlipidemia, unspecified: Secondary | ICD-10-CM

## 2022-09-08 DIAGNOSIS — J432 Centrilobular emphysema: Secondary | ICD-10-CM

## 2022-09-08 DIAGNOSIS — E1169 Type 2 diabetes mellitus with other specified complication: Secondary | ICD-10-CM

## 2022-09-08 DIAGNOSIS — I25110 Atherosclerotic heart disease of native coronary artery with unstable angina pectoris: Secondary | ICD-10-CM

## 2022-09-08 DIAGNOSIS — E1140 Type 2 diabetes mellitus with diabetic neuropathy, unspecified: Secondary | ICD-10-CM

## 2022-09-08 DIAGNOSIS — E1159 Type 2 diabetes mellitus with other circulatory complications: Secondary | ICD-10-CM

## 2022-09-08 LAB — BAYER DCA HB A1C WAIVED: HB A1C (BAYER DCA - WAIVED): 7.3 % — ABNORMAL HIGH (ref 4.8–5.6)

## 2022-09-08 MED ORDER — DAPAGLIFLOZIN PROPANEDIOL 10 MG PO TABS
10.0000 mg | ORAL_TABLET | Freq: Every day | ORAL | 3 refills | Status: DC
Start: 2022-09-08 — End: 2022-10-25

## 2022-09-08 NOTE — Progress Notes (Signed)
Established Patient Office Visit  Subjective   Patient ID: Larry Floyd, male    DOB: [birth year]  Age: 62 y.o. MRN: [id number]  Chief Complaint  Patient presents with   Medical Management of Chronic Issues   Diabetes    HPI T2DM Pt presents for follow up evaluation of Type 2 diabetes mellitus. Patient denies foot ulcerations, increased appetite, nausea, paresthesia of the feet, polydipsia, polyuria, visual disturbances, vomiting, and weight loss.  Current diabetic medications include farxiga 5 mg, januvia 100 mg, metformin 100 mg BID Compliant with meds - Yes  Current monitoring regimen: none Current diet: in general, an "unhealthy" diet Current exercise: none  Urine microalbumin UTD? Yes Is He on ACE inhibitor or angiotensin II receptor blocker?  No, declines Is He on statin? Yes atorvastatin Is He on ASA 81 mg daily?  Yes  2. HTN Complaint with meds - Yes Current Medications - amlodipine, bisoprolol, HCTZ Checking BP at home ranging 110s/60s Pertinent ROS:  Headache - No Fatigue - baseline Visual Disturbances - No Chest pain - No Dyspnea - baseline Palpitations - No LE edema - No    3. COPD Has seen pulmonology. He has upcoming PFTs and a low dose CT scan scheduled. They have continued him on trelegy.   4. GERD Compliant with medications - Yes Current medications - omeprazole 20 mg daily Voice change - No Hemoptysis - No Dysphagia or dyspepsia - No Water brash - No Red Flags (weight loss, hematochezia, melena, weight loss, early satiety, fevers, odynophagia, or persistent vomiting) - No   Past Medical History:  Diagnosis Date   Arthritis    Bronchitis    Cataract    Diabetes mellitus without complication    Emphysema lung    GERD (gastroesophageal reflux disease)    Hypercholesteremia    Hypertension       ROS As per HPI.    Objective:     BP 118/66   Pulse 74   Temp 97.7 F (36.5 C) (Temporal)   Ht  (1.702 m)   Wt 150 lb 8  oz (68.3 kg)   SpO2 94%   BMI 23.57 kg/m    Physical Exam Vitals and nursing note reviewed.  Constitutional:      General: He is not in acute distress.    Appearance: Normal appearance. He is not ill-appearing, toxic-appearing or diaphoretic.  Cardiovascular:     Rate and Rhythm: Normal rate and regular rhythm.     Heart sounds: Normal heart sounds. No murmur heard. Pulmonary:     Effort: Pulmonary effort is normal. No respiratory distress.     Breath sounds: Normal breath sounds. No wheezing, rhonchi or rales.  Abdominal:     General: Bowel sounds are normal. There is no distension.     Palpations: Abdomen is soft.     Tenderness: There is no abdominal tenderness. There is no guarding or rebound.  Musculoskeletal:     Cervical back: Neck supple. No rigidity.     Right lower leg: No edema.     Left lower leg: No edema.  Skin:    General: Skin is warm and dry.  Neurological:     General: No focal deficit present.     Mental Status: He is alert and oriented to person, place, and time.  Psychiatric:        Mood and Affect: Mood normal.        Behavior: Behavior normal.      No results found  for any visits on 09/08/22.    The ASCVD Risk score (Arnett DK, et al., 2019) failed to calculate for the following reasons:   The valid total cholesterol range is 130 to 320 mg/dL    Assessment & Plan:   Dionisios was seen today for medical management of chronic issues and diabetes.  Diagnoses and all orders for this visit:  Type 2 diabetes mellitus with diabetic neuropathy, without long-term current use of insulin A1c 7.3 today, not goal of <7. Medication changes today: increase farxiga to 10 mg. He has declined an ACE/ARB and he is on a statin. Eye exam: UTD. Foot exam: UTD. Urine micro: UTD. Diet and exercise.  -     Bayer DCA Hb A1c Waived -     dapagliflozin propanediol (FARXIGA) 10 MG TABS tablet; Take 1 tablet (10 mg total) by mouth daily before breakfast.  Hypertension  associated with diabetes Well controlled on current regimen.   Hyperlipidemia associated with type 2 diabetes mellitus Last LDL 53. On statin.   Coronary artery disease involving native coronary artery of native heart with unstable angina pectoris On amlodipine, bisoprolol, HCTZ. On statin. Managed by cardiology. Has upcoming follow up.   Centrilobular emphysema Established with pulmonology, has upcoming PFTs and CT.    Return in about 3 months (around 12/08/2022) for chronic follow up.  The patient indicates understanding of these issues and agrees with the plan.  Gabriel Earing, FNP

## 2022-09-14 ENCOUNTER — Encounter (HOSPITAL_BASED_OUTPATIENT_CLINIC_OR_DEPARTMENT_OTHER): Payer: Medicaid Other

## 2022-09-14 ENCOUNTER — Ambulatory Visit (HOSPITAL_BASED_OUTPATIENT_CLINIC_OR_DEPARTMENT_OTHER): Payer: Medicaid Other | Admitting: Pulmonary Disease

## 2022-09-21 ENCOUNTER — Other Ambulatory Visit: Payer: Self-pay | Admitting: Family Medicine

## 2022-10-18 ENCOUNTER — Ambulatory Visit: Payer: Medicaid Other | Admitting: Cardiology

## 2022-10-18 ENCOUNTER — Telehealth (HOSPITAL_BASED_OUTPATIENT_CLINIC_OR_DEPARTMENT_OTHER): Payer: Self-pay | Admitting: Pulmonary Disease

## 2022-10-18 DIAGNOSIS — Z122 Encounter for screening for malignant neoplasm of respiratory organs: Secondary | ICD-10-CM

## 2022-10-18 DIAGNOSIS — Z87891 Personal history of nicotine dependence: Secondary | ICD-10-CM

## 2022-10-18 DIAGNOSIS — F17210 Nicotine dependence, cigarettes, uncomplicated: Secondary | ICD-10-CM

## 2022-10-18 NOTE — Telephone Encounter (Signed)
Patients wife called stating they haven't heard about being scheduled for CT scan for Dr. Vassie Loll. Please advise.

## 2022-10-19 NOTE — Telephone Encounter (Signed)
Oretha Milch, MD  You; [facility] Clinical Pool13 minutes ago (4:27 PM)    He needs referral to lung cancer screening program    Order was placed at pt's last visit for him to have referral for lung cancer screening program.  Routing encounter to lung nodule pool.

## 2022-10-19 NOTE — Telephone Encounter (Signed)
I do not see a CT order for this pt/   Please advise

## 2022-10-20 NOTE — Telephone Encounter (Signed)
Pt has been scheduled for [facility] and LDCT. Will close encounter.

## 2022-10-24 ENCOUNTER — Other Ambulatory Visit: Payer: Self-pay | Admitting: Family Medicine

## 2022-10-24 DIAGNOSIS — E1140 Type 2 diabetes mellitus with diabetic neuropathy, unspecified: Secondary | ICD-10-CM

## 2022-10-24 DIAGNOSIS — I10 Essential (primary) hypertension: Secondary | ICD-10-CM

## 2022-10-25 MED ORDER — DAPAGLIFLOZIN PROPANEDIOL 10 MG PO TABS
10.0000 mg | ORAL_TABLET | Freq: Every day | ORAL | 0 refills | Status: DC
Start: 2022-10-25 — End: 2022-12-20

## 2022-10-25 NOTE — Addendum Note (Signed)
Addended by: Julious Payer D on: 10/25/2022 10:31 AM   Modules accepted: Orders

## 2022-11-15 IMAGING — DX DG KNEE 1-2V*L*
2 series · 2 of 2 positions shown · non-contrast
Comparison: None Available.

CLINICAL DATA: Chronic left knee pain

EXAM:
LEFT KNEE - 1-2 VIEW

[knee ap]
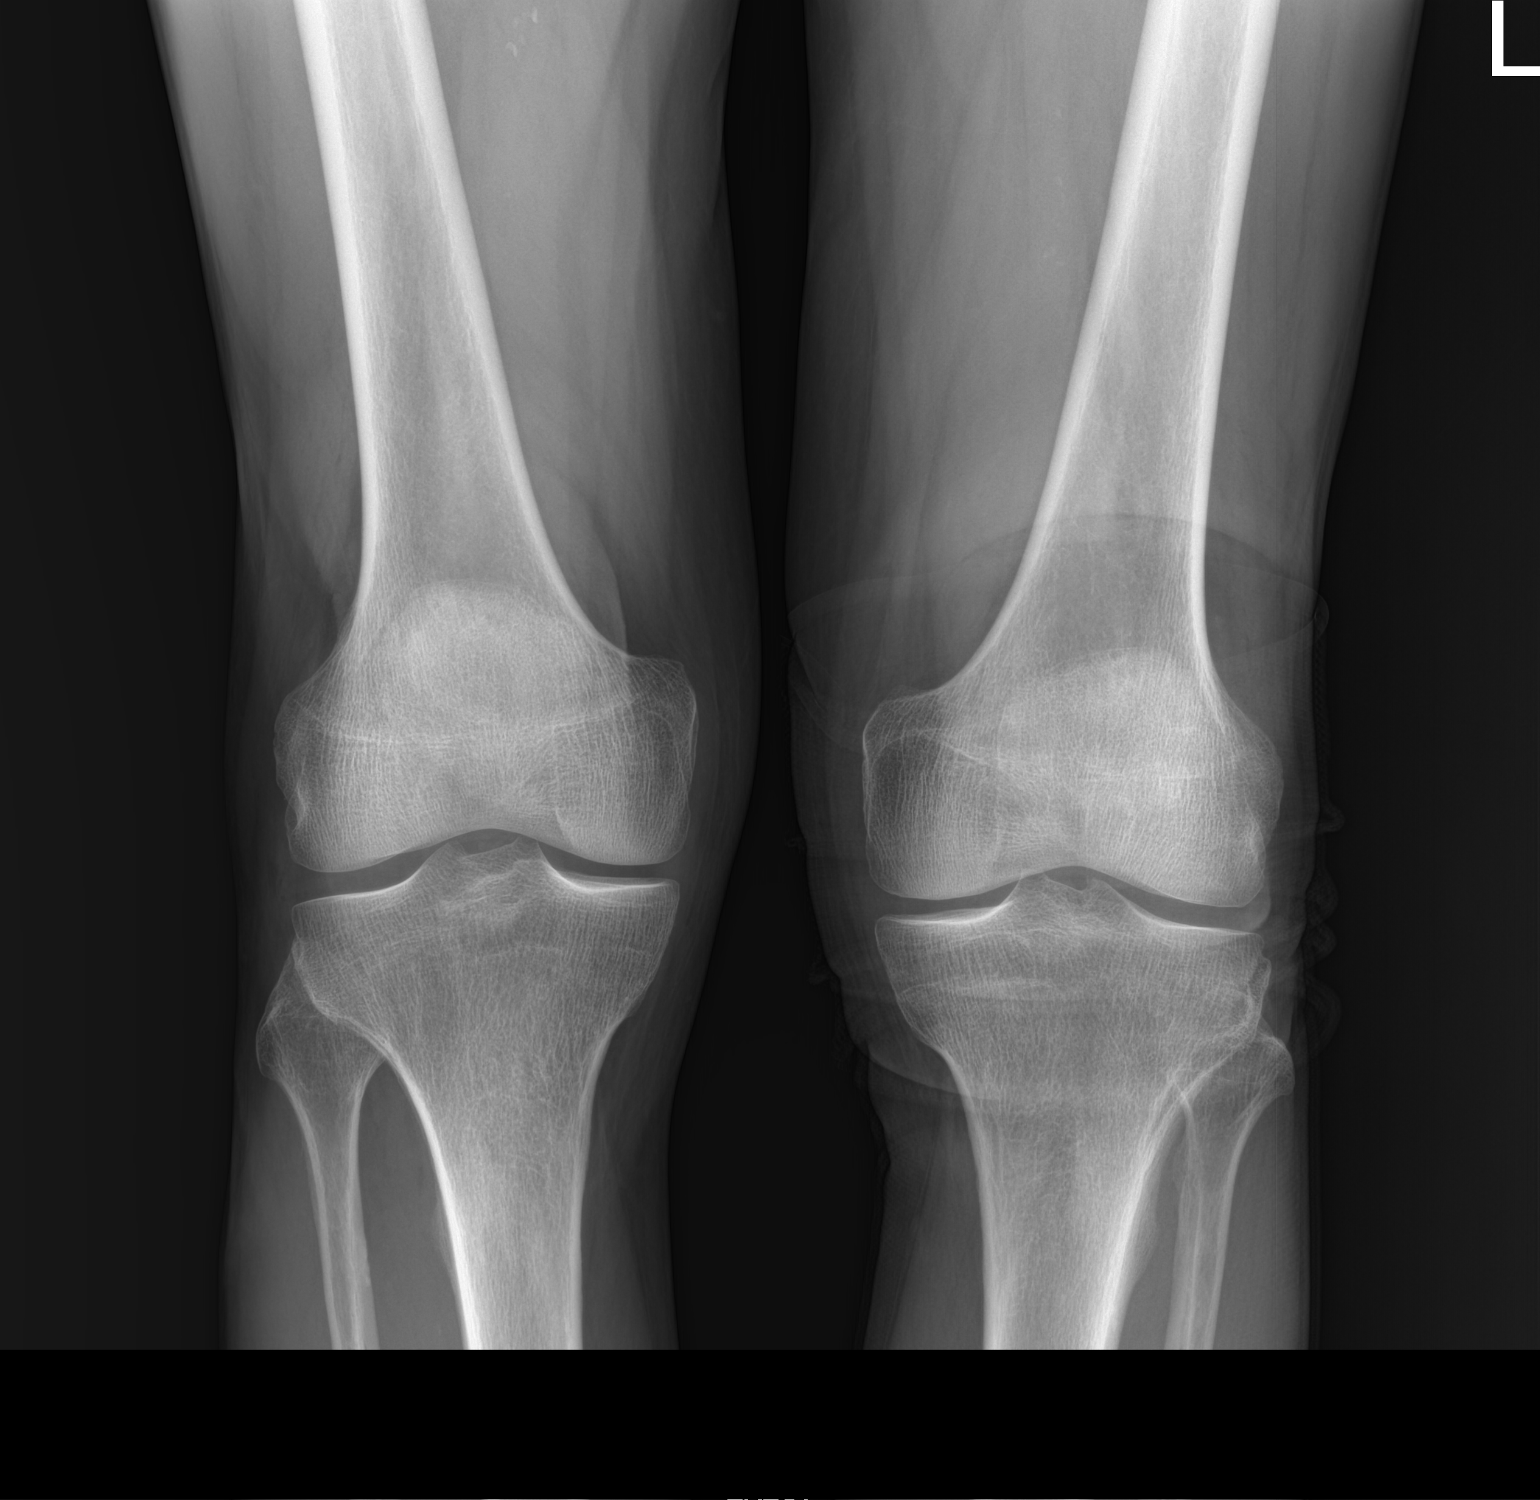

[knee lat]
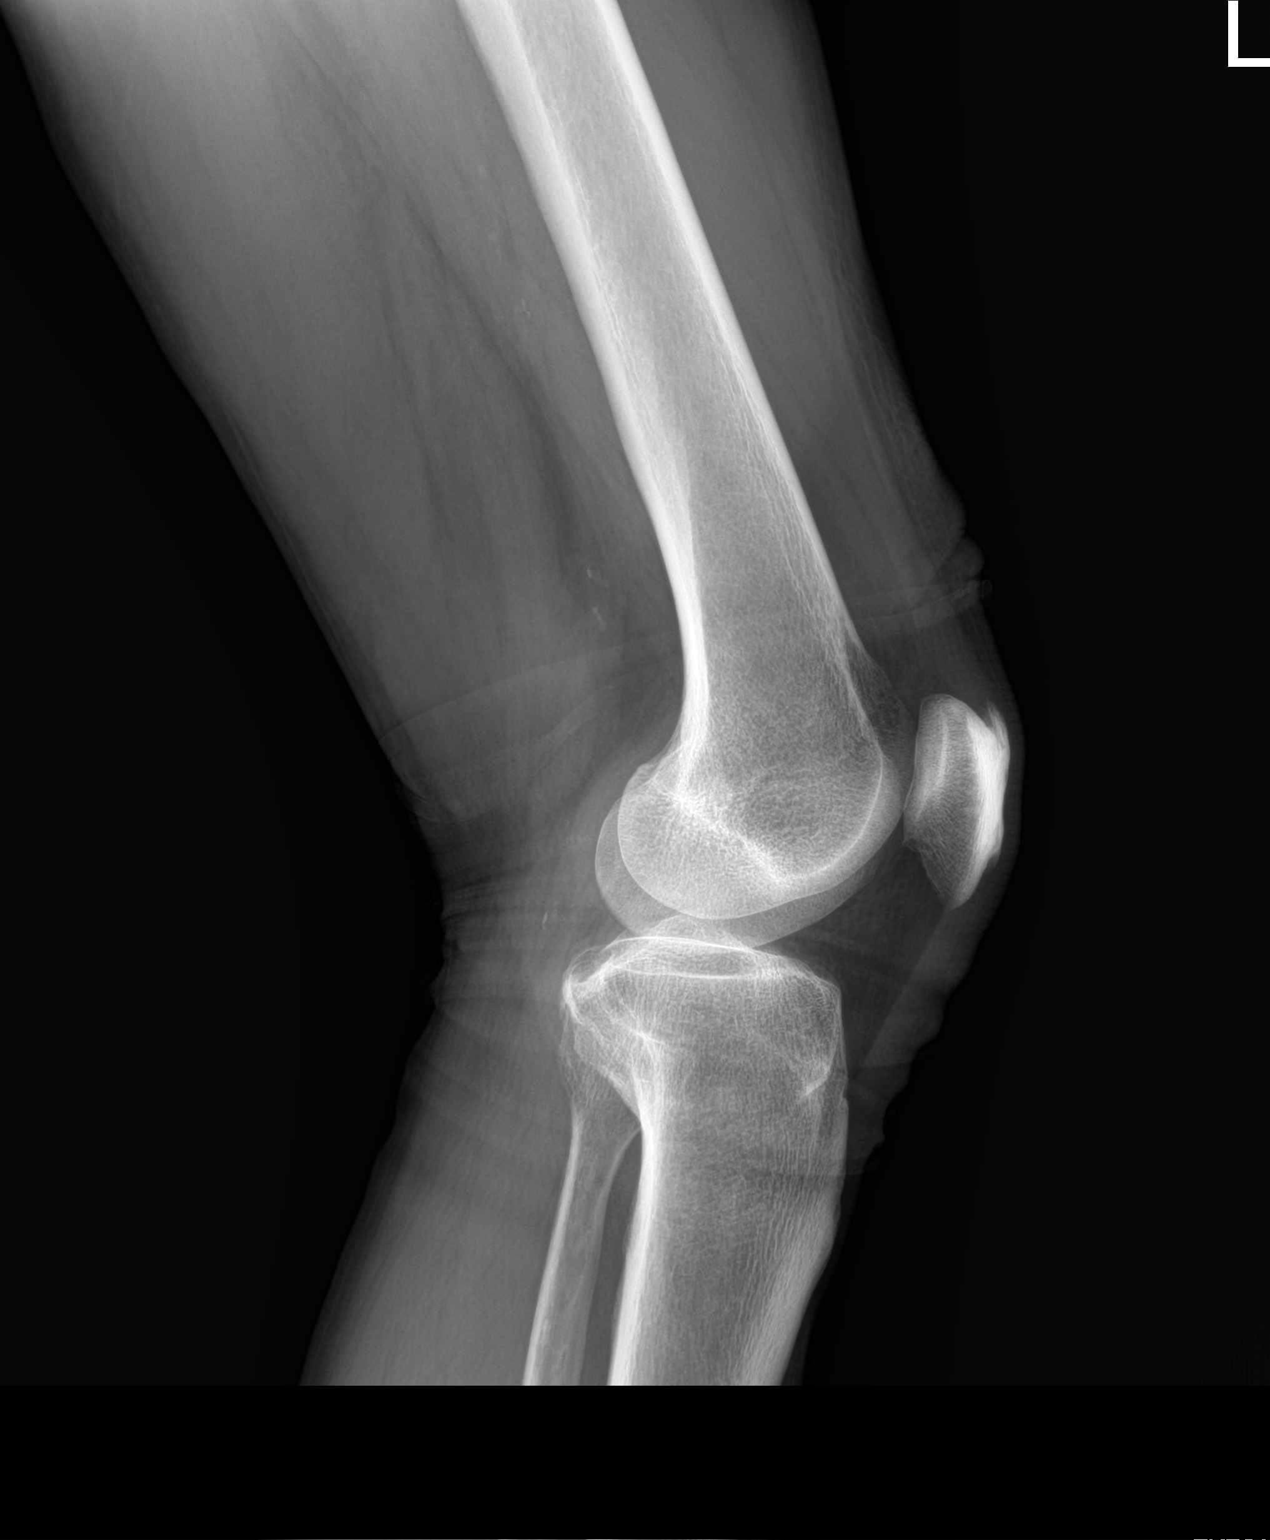

[2 of 2 positions shown; findings below may reference images not displayed]

FINDINGS: A single anterior view of the right knee is normal.

Enthesopathic changes are associated with the anterior left patella.
Minimal degenerative changes in the patellofemoral compartment. The
left knee is otherwise normal in appearance.
IMPRESSION: 1. The single anterior view of the right knee is normal.
2. Enthesopathic changes associated with the anterior left patella.
Minimal degenerative changes in the patellofemoral compartment on
the left.

## 2022-11-16 NOTE — Progress Notes (Deleted)
Cardiology Clinic Note   Patient Name: Larry Floyd Date of Encounter: 11/16/2022  Primary Care Provider:  Gabriel Earing, FNP Primary Cardiologist:  Bryan Lemma, MD  Patient Profile    62 year old male history of hyperlipidemia associated with type 2 diabetes, chronic angina, cath without evidence of significant CAD, hypertension, chronic dyspnea on exertion, and fatigue. Echo with Left ventricular ejection fraction, by estimation, is 55 to 60%.  Past Medical History    Past Medical History:  Diagnosis Date   Arthritis    Bronchitis    Cataract    Diabetes mellitus without complication (HCC)    Emphysema lung (HCC)    GERD (gastroesophageal reflux disease)    Hypercholesteremia    Hypertension    Past Surgical History:  Procedure Laterality Date   BACK SURGERY     Lumbar   CORONARY PRESSURE/FFR STUDY N/A 11/25/2021   Procedure: INTRAVASCULAR PRESSURE WIRE/FFR STUDY;  Surgeon: Marykay Lex, MD;  Location: [facility] INVASIVE CV LAB;  Service: Cardiovascular;  Laterality: N/A;   EYE SURGERY Bilateral    implant insertion   HERNIA REPAIR     x2   LEFT HEART CATH AND CORONARY ANGIOGRAPHY N/A 11/25/2021   Procedure: LEFT HEART CATH AND CORONARY ANGIOGRAPHY;  Surgeon: Marykay Lex, MD;  Location: [facility]::   Angiographically Mod 1 V CAD w/ Dist LCx 60% - RFR NEGATIVE lesion.   Prox RCA 20%.  Mid RCA 10%.  Prox LAD to Mid LAD 15% . Normal LV function-EF~50 to 55%, with mildly elevated LVEDP. LV- gram suggest possible anterior wall motion abnormality.  REC: TTE to better assess.    Allergies  No Known Allergies  History of Present Illness    ***  Home Medications    Current Outpatient Medications  Medication Sig Dispense Refill   albuterol (VENTOLIN HFA) 108 (90 Base) MCG/ACT inhaler Inhale 2 puffs into the lungs every 6 (six) hours as needed for wheezing or shortness of breath. 20.1 g 0   amLODipine (NORVASC) 2.5 MG tablet Take 1 tablet by mouth once  daily 90 tablet 0   aspirin EC 81 MG tablet Take 81 mg by mouth in the morning. Swallow whole.     atorvastatin (LIPITOR) 40 MG tablet Take 1 tablet (40 mg total) by mouth daily. (Patient taking differently: Take 40 mg by mouth every evening.) 90 tablet 3   bisoprolol (ZEBETA) 5 MG tablet Take 1 tablet by mouth once daily 30 tablet 3   cetirizine (ZYRTEC) 10 MG tablet Take 0.5 tablets (5 mg total) by mouth daily. (Patient taking differently: Take 10 mg by mouth daily.) 45 tablet 1   dapagliflozin propanediol (FARXIGA) 10 MG TABS tablet Take 1 tablet (10 mg total) by mouth daily before breakfast. 90 tablet 0   Fluticasone-Umeclidin-Vilant (TRELEGY ELLIPTA) 100-62.5-25 MCG/ACT AEPB Inhale 1 Inhalation into the lungs daily. 60 each 1   gabapentin (NEURONTIN) 600 MG tablet Take 0.5 tablet by mouth in the morning then take a full tablet by mouth at bedtime. 180 tablet 1   hydrochlorothiazide (HYDRODIURIL) 25 MG tablet Take 1 tablet (25 mg total) by mouth daily. 90 tablet 3   JANUVIA 100 MG tablet Take 1 tablet by mouth once daily 90 tablet 0   meloxicam (MOBIC) 15 MG tablet Take 1 tablet by mouth once daily 30 tablet 0   metFORMIN (GLUCOPHAGE) 1000 MG tablet TAKE 1 TABLET BY MOUTH TWICE DAILY WITH MEALS 180 tablet 0   Omega-3 Fatty Acids (FISH OIL)  1000 MG CPDR Take 1,000 mg by mouth daily. And then increase to 3000 mg daily after 3 weeks 90 capsule 11   omeprazole (PRILOSEC) 20 MG capsule Take 1 capsule by mouth once daily 90 capsule 0   No current facility-administered medications for this visit.     Family History    Family History  Problem Relation Age of Onset   Stroke Mother    Hyperlipidemia Mother    Cancer Mother        throat cancer   Hypertension Mother    Atrial fibrillation Mother    Hypertension Father    Hyperlipidemia Father    COPD Father    Heart disease Father    AAA (abdominal aortic aneurysm) Father    Heart attack Father    Diabetes Sister    Heart murmur Sister     Hearing loss Daughter    He indicated that his mother is alive. He indicated that his father is deceased. He indicated that his sister is alive. He indicated that his brother is alive. He indicated that his maternal grandmother is deceased. He indicated that his maternal grandfather is deceased. He indicated that his paternal grandmother is deceased. He indicated that his paternal grandfather is deceased. He indicated that his daughter is alive.  Social History    Social History   Socioeconomic History   Marital status: Married    Spouse name: Burna Mortimer   Number of children: 1   Years of education: 7   Highest education level: 7th grade  Occupational History   Not on file  Tobacco Use   Smoking status: Every Day    Packs/day: 3.00    Years: 45.00    Additional pack years: 0.00    Total pack years: 135.00    Types: Cigarettes   Smokeless tobacco: Never   Tobacco comments:    Started smoking at 44  Vaping Use   Vaping Use: Never used  Substance and Sexual Activity   Alcohol use: Yes    Alcohol/week: 5.0 standard drinks of alcohol    Types: 5 Shots of liquor per week    Comment: drinks "a couple shots" one night a week   Drug use: No   Sexual activity: Yes    Birth control/protection: None  Other Topics Concern   Not on file  Social History Narrative   Relatively 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   Still smokes.   Social Determinants of Health   Financial Resource Strain: Not on file  Food Insecurity: Not on file  Transportation Needs: Not on file  Physical Activity: Not on file  Stress: Not on file  Social Connections: Not on file  Intimate Partner Violence: Not on file     Review of Systems    General:  No chills, fever, night sweats or weight changes.  Cardiovascular:  No chest pain, dyspnea on exertion, edema, orthopnea, palpitations, paroxysmal nocturnal dyspnea. Dermatological: No rash, lesions/masses Respiratory: No cough,  dyspnea Urologic: No hematuria, dysuria Abdominal:   No nausea, vomiting, diarrhea, bright red blood per rectum, melena, or hematemesis Neurologic:  No visual changes, wkns, changes in mental status. All other systems reviewed and are otherwise negative except as noted above.       Physical Exam    VS:  There were no vitals taken for this visit. , BMI There is no height or weight on file to calculate BMI.     GEN: Well nourished, well developed, in no  acute distress. HEENT: normal. Neck: Supple, no JVD, carotid bruits, or masses. Cardiac: RRR, no murmurs, rubs, or gallops. No clubbing, cyanosis, edema.  Radials/DP/PT 2+ and equal bilaterally.  Respiratory:  Respirations regular and unlabored, clear to auscultation bilaterally. GI: Soft, nontender, nondistended, BS + x 4. MS: no deformity or atrophy. Skin: warm and dry, no rash. Neuro:  Strength and sensation are intact. Psych: Normal affect.      Lab Results  Component Value Date   WBC 12.1 (H) 11/19/2021   HGB 13.8 11/19/2021   HCT 41.1 11/19/2021   MCV 86 11/19/2021   PLT 342 11/19/2021   Lab Results  Component Value Date   CREATININE 1.02 11/19/2021   BUN 10 11/19/2021   NA 136 11/19/2021   K 4.2 11/19/2021   CL 97 11/19/2021   CO2 22 11/19/2021   Lab Results  Component Value Date   ALT 19 03/08/2022   AST 18 03/08/2022   ALKPHOS 106 03/08/2022   BILITOT <0.2 03/08/2022   Lab Results  Component Value Date   CHOL 124 03/08/2022   HDL 38 (L) 03/08/2022   LDLCALC 53 03/08/2022   TRIG 199 (H) 03/08/2022   CHOLHDL 3.3 03/08/2022    Lab Results  Component Value Date   HGBA1C 7.3 (H) 09/08/2022     Review of Prior Studies  [facility] 11/25/2021 Dist Cx lesion is 60% stenosed.   Prox RCA lesion is 20% stenosed.   Mid RCA lesion is 10% stenosed.   Prox LAD to Mid LAD lesion is 15% stenosed.   The left ventricular systolic function is normal.   LV end diastolic pressure is mildly elevated.   The left  ventricular ejection fraction is 50-55% by visual estimate.   Angiographically moderate single-vessel disease with RFR negative roughly 60% lesion in the mid to distal LCx. LV gram suggest possible anterior wall motion abnormality.  Would recommend 2D echo to better assess.   Recommend medical therapy.      Assessment & Plan   1.  ***     {Are you ordering a CV Procedure (e.g. stress test, cath, DCCV, TEE, etc)?   Press F2        :[id number]}   Signed, Bettey Mare. Liborio Nixon, ANP, AACC   11/16/2022 12:59 PM      Office [phone] Fax [phone]  Notice: This dictation was prepared with Dragon dictation along with smaller phrase technology. Any transcriptional errors that result from this process are unintentional and may not be corrected upon review.

## 2022-11-22 ENCOUNTER — Ambulatory Visit: Payer: Self-pay | Attending: Cardiology | Admitting: Adult Health

## 2022-11-23 ENCOUNTER — Encounter (HOSPITAL_BASED_OUTPATIENT_CLINIC_OR_DEPARTMENT_OTHER): Payer: Medicaid Other

## 2022-11-25 ENCOUNTER — Other Ambulatory Visit: Payer: Self-pay | Admitting: Family Medicine

## 2022-11-25 DIAGNOSIS — E1165 Type 2 diabetes mellitus with hyperglycemia: Secondary | ICD-10-CM

## 2022-11-26 ENCOUNTER — Encounter (HOSPITAL_BASED_OUTPATIENT_CLINIC_OR_DEPARTMENT_OTHER): Payer: Medicaid Other

## 2022-11-29 ENCOUNTER — Ambulatory Visit (HOSPITAL_COMMUNITY)
Admission: RE | Admit: 2022-11-29 | Discharge: 2022-11-29 | Disposition: A | Discharge status: Home / Self Care | Payer: No Typology Code available for payment source | Source: Ambulatory Visit | Attending: Pulmonary Disease | Admitting: Pulmonary Disease

## 2022-11-29 DIAGNOSIS — J432 Centrilobular emphysema: Secondary | ICD-10-CM | POA: Diagnosis present

## 2022-11-29 LAB — PULMONARY FUNCTION TEST
DL/VA % pred: 53 %
DL/VA: 2.25 ml/min/mmHg/L
DLCO unc % pred: 48 %
DLCO unc: 12.06 ml/min/mmHg
FEF 25-75 Pre: 0.73 L/sec
FEF2575-%Pred-Pre: 27 %
FEV1-%Pred-Pre: 73 %
FEV1-Pre: 2.36 L
FEV1FVC-%Pred-Pre: 76 %
FEV6-%Pred-Pre: 87 %
FEV6-Pre: 3.54 L
FEV6FVC-%Pred-Pre: 91 %
FVC-%Pred-Pre: 96 %
FVC-Pre: 4.09 L
Pre FEV1/FVC ratio: 58 %
Pre FEV6/FVC Ratio: 87 %
RV % pred: 133 %
RV: 2.8 L
TLC % pred: 104 %
TLC: 6.69 L

## 2022-11-30 ENCOUNTER — Ambulatory Visit: Payer: PRIVATE HEALTH INSURANCE | Admitting: Acute Care

## 2022-11-30 ENCOUNTER — Encounter: Payer: Self-pay | Admitting: Acute Care

## 2022-11-30 DIAGNOSIS — F17210 Nicotine dependence, cigarettes, uncomplicated: Secondary | ICD-10-CM | POA: Diagnosis not present

## 2022-11-30 NOTE — Patient Instructions (Signed)

## 2022-11-30 NOTE — Progress Notes (Signed)
Virtual Visit via Telephone Note  I connected with Larry Floyd on 11/30/22 at  2:00 PM EDT by telephone and verified that I am speaking with the correct person using two identifiers.  Location: Patient:  At home Provider:  [address], [place], [place], [address]    I discussed the limitations, risks, security and privacy concerns of performing an evaluation and management service by telephone and the availability of in person appointments. I also discussed with the patient that there may be a patient responsible charge related to this service. The patient expressed understanding and agreed to proceed.   Shared Decision Making Visit Lung Cancer Screening Program [phone])   Eligibility: Age 62 y.o. Pack Years Smoking History Calculation 147 pack year smoking history (# packs/per year x # years smoked) Recent History of coughing up blood  no Unexplained weight loss? no ( >Than 15 pounds within the last 6 months ) Prior History Lung / other cancer no (Diagnosis within the last 5 years already requiring surveillance chest CT Scans). Smoking Status Current Smoker Former Smokers: Years since quit:  NA  Quit Date:  NA  Visit Components: Discussion included one or more decision making aids. yes Discussion included risk/benefits of screening. yes Discussion included potential follow up diagnostic testing for abnormal scans. yes Discussion included meaning and risk of over diagnosis. yes Discussion included meaning and risk of False Positives. yes Discussion included meaning of total radiation exposure. yes  Counseling Included: Importance of adherence to annual lung cancer LDCT screening. yes Impact of comorbidities on ability to participate in the program. yes Ability and willingness to under diagnostic treatment. yes  Smoking Cessation Counseling: Current Smokers:  Discussed importance of smoking cessation. yes Information about tobacco cessation classes and  interventions provided to patient. yes Patient provided with "ticket" for LDCT Scan. yes Symptomatic Patient. no  Counseling NA Diagnosis Code: Tobacco Use Z72.0 Asymptomatic Patient yes  Counseling (Intermediate counseling: > three minutes counseling) X5170 Former Smokers:  Discussed the importance of maintaining cigarette abstinence. yes Diagnosis Code: Personal History of Nicotine Dependence. Y17.494 Information about tobacco cessation classes and interventions provided to patient. Yes Patient provided with "ticket" for LDCT Scan. yes Written Order for Lung Cancer Screening with LDCT placed in Epic. Yes (CT Chest Lung Cancer Screening Low Dose W/O CM) [id number] Z12.2-Screening of respiratory organs Z87.891-Personal history of nicotine dependence  I have spent 25 minutes of face to face/ virtual visit   time with  Larry Floyd discussing the risks and benefits of lung cancer screening. We viewed / discussed a power point together that explained in detail the above noted topics. We paused at intervals to allow for questions to be asked and answered to ensure understanding.We discussed that the single most powerful action that he can take to decrease his risk of developing lung cancer is to quit smoking. We discussed whether or not he is ready to commit to setting a quit date. We discussed options for tools to aid in quitting smoking including nicotine replacement therapy, non-nicotine medications, support groups, Quit Smart classes, and behavior modification. We discussed that often times setting smaller, more achievable goals, such as eliminating 1 cigarette a day for a week and then 2 cigarettes a day for a week can be helpful in slowly decreasing the number of cigarettes smoked. This allows for a sense of accomplishment as well as providing a clinical benefit. I provided  him  with smoking cessation  information  with contact information for community resources,  classes, free nicotine replacement  therapy, and access to mobile apps, text messaging, and on-line smoking cessation help. I have also provided  him  the office contact information in the event he needs to contact me, or the screening staff. We discussed the time and location of the scan, and that either Abigail Miyamoto RN, Karlton Lemon, RN  or I will call / send a letter with the results within 24-72 hours of receiving them. The patient verbalized understanding of all of  the above and had no further questions upon leaving the office. They have my contact information in the event they have any further questions.  I spent 3 minutes counseling on smoking cessation and the health risks of continued tobacco abuse.  I explained to the patient that there has been a high incidence of coronary artery disease noted on these exams. I explained that this is a non-gated exam therefore degree or severity cannot be determined. This patient is on statin therapy. I have asked the patient to follow-up with their PCP regarding any incidental finding of coronary artery disease and management with diet or medication as their PCP  feels is clinically indicated. The patient verbalized understanding of the above and had no further questions upon completion of the visit.      Bevelyn Ngo, NP 11/30/2022

## 2022-12-08 ENCOUNTER — Ambulatory Visit: Payer: Self-pay | Admitting: Family Medicine

## 2022-12-11 ENCOUNTER — Other Ambulatory Visit: Payer: Self-pay | Admitting: Family Medicine

## 2022-12-15 ENCOUNTER — Ambulatory Visit (HOSPITAL_COMMUNITY)
Admission: RE | Admit: 2022-12-15 | Discharge: 2022-12-15 | Disposition: A | Discharge status: Home / Self Care | Payer: PRIVATE HEALTH INSURANCE | Source: Ambulatory Visit | Attending: Acute Care | Admitting: Acute Care

## 2022-12-15 DIAGNOSIS — Z87891 Personal history of nicotine dependence: Secondary | ICD-10-CM | POA: Diagnosis present

## 2022-12-15 DIAGNOSIS — Z122 Encounter for screening for malignant neoplasm of respiratory organs: Secondary | ICD-10-CM | POA: Diagnosis present

## 2022-12-15 DIAGNOSIS — F17210 Nicotine dependence, cigarettes, uncomplicated: Secondary | ICD-10-CM

## 2022-12-20 ENCOUNTER — Ambulatory Visit: Payer: 59 | Admitting: Family Medicine

## 2022-12-20 ENCOUNTER — Encounter: Payer: Self-pay | Admitting: Family Medicine

## 2022-12-20 VITALS — BP 106/60 | HR 61 | Temp 98.2°F | Ht 67.0 in | Wt 145.0 lb

## 2022-12-20 DIAGNOSIS — E1159 Type 2 diabetes mellitus with other circulatory complications: Secondary | ICD-10-CM | POA: Diagnosis not present

## 2022-12-20 DIAGNOSIS — I152 Hypertension secondary to endocrine disorders: Secondary | ICD-10-CM

## 2022-12-20 DIAGNOSIS — M25562 Pain in left knee: Secondary | ICD-10-CM

## 2022-12-20 DIAGNOSIS — R079 Chest pain, unspecified: Secondary | ICD-10-CM | POA: Diagnosis not present

## 2022-12-20 DIAGNOSIS — G8929 Other chronic pain: Secondary | ICD-10-CM

## 2022-12-20 DIAGNOSIS — J432 Centrilobular emphysema: Secondary | ICD-10-CM

## 2022-12-20 DIAGNOSIS — R42 Dizziness and giddiness: Secondary | ICD-10-CM | POA: Diagnosis not present

## 2022-12-20 DIAGNOSIS — E785 Hyperlipidemia, unspecified: Secondary | ICD-10-CM

## 2022-12-20 DIAGNOSIS — F17200 Nicotine dependence, unspecified, uncomplicated: Secondary | ICD-10-CM

## 2022-12-20 DIAGNOSIS — I25110 Atherosclerotic heart disease of native coronary artery with unstable angina pectoris: Secondary | ICD-10-CM

## 2022-12-20 DIAGNOSIS — E1169 Type 2 diabetes mellitus with other specified complication: Secondary | ICD-10-CM

## 2022-12-20 DIAGNOSIS — K219 Gastro-esophageal reflux disease without esophagitis: Secondary | ICD-10-CM

## 2022-12-20 DIAGNOSIS — E1140 Type 2 diabetes mellitus with diabetic neuropathy, unspecified: Secondary | ICD-10-CM

## 2022-12-20 LAB — BAYER DCA HB A1C WAIVED: HB A1C (BAYER DCA - WAIVED): 7 % — ABNORMAL HIGH (ref 4.8–5.6)

## 2022-12-20 MED ORDER — AMLODIPINE BESYLATE 2.5 MG PO TABS
2.5000 mg | ORAL_TABLET | Freq: Every day | ORAL | 3 refills | Status: DC
Start: 2022-12-20 — End: 2023-08-15

## 2022-12-20 MED ORDER — MELOXICAM 15 MG PO TABS
15.0000 mg | ORAL_TABLET | Freq: Every day | ORAL | 5 refills | Status: DC
Start: 2022-12-20 — End: 2023-06-16

## 2022-12-20 MED ORDER — SITAGLIPTIN PHOSPHATE 100 MG PO TABS
100.0000 mg | ORAL_TABLET | Freq: Every day | ORAL | 3 refills | Status: DC
Start: 2022-12-20 — End: 2024-01-23

## 2022-12-20 MED ORDER — OMEPRAZOLE 20 MG PO CPDR
20.0000 mg | DELAYED_RELEASE_CAPSULE | Freq: Every day | ORAL | 3 refills | Status: DC
Start: 2022-12-20 — End: 2023-03-23

## 2022-12-20 MED ORDER — METFORMIN HCL 1000 MG PO TABS
1000.0000 mg | ORAL_TABLET | Freq: Two times a day (BID) | ORAL | 3 refills | Status: DC
Start: 2022-12-20 — End: 2024-02-13

## 2022-12-20 MED ORDER — DAPAGLIFLOZIN PROPANEDIOL 10 MG PO TABS
10.0000 mg | ORAL_TABLET | Freq: Every day | ORAL | 3 refills | Status: DC
Start: 2022-12-20 — End: 2023-05-08

## 2022-12-20 MED ORDER — ALBUTEROL SULFATE HFA 108 (90 BASE) MCG/ACT IN AERS
2.0000 | INHALATION_SPRAY | Freq: Four times a day (QID) | RESPIRATORY_TRACT | 2 refills | Status: DC | PRN
Start: 2022-12-20 — End: 2023-03-23

## 2022-12-20 MED ORDER — GABAPENTIN 600 MG PO TABS
ORAL_TABLET | ORAL | 1 refills | Status: DC
Start: 2022-12-20 — End: 2023-12-26

## 2022-12-20 NOTE — Progress Notes (Signed)
Established Patient Office Visit  Subjective   Patient ID: Larry Floyd, male    DOB: 12/25/1960  Age: 62 y.o. MRN: [id number]  Chief Complaint  Patient presents with   Medical Management of Chronic Issues   Diabetes   Hypertension    HPI T2DM Pt presents for follow up evaluation of Type 2 diabetes mellitus. Patient denies foot ulcerations, increased appetite, nausea, paresthesia of the feet, polydipsia, polyuria, visual disturbances, vomiting, and weight loss.  Current diabetic medications include farxiga 10 mg, januvia 100 mg, metformin 1000 mg BID Compliant with meds - He has been out of [place] for the last week.   Current monitoring regimen: none Current diet: in general, an "unhealthy" diet Current exercise: none  Urine microalbumin UTD? Yes Is He on ACE inhibitor or angiotensin II receptor blocker?  No, declines Is He on statin? Yes atorvastatin Is He on ASA 81 mg daily?  Yes  2. HTN Complaint with meds - Yes Current Medications - amlodipine, bisoprolol Checking BP at home ranging 100s/60s Pertinent ROS:  Headache - No Fatigue - yes chronic Visual Disturbances - No Chest pain - Come and go at rest, for a few minutes. Dizziness - Through day with activity and at rest Dyspnea - baseline Palpitations - No LE edema - No  3. COPD Now established with pulmonology. Just had lung cancer screening done-no results yet. On trelegy. He has had PFTs done. Will follow up with pulm next week. Using albuterol 4x a day usually. SHOB worse with activity.   4. GERD Compliant with medications - Yes Current medications - omeprazole 20 mg daily Voice change - No Hemoptysis - No Dysphagia or dyspepsia - No Water brash - No Red Flags (weight loss, hematochezia, melena, weight loss, early satiety, fevers, odynophagia, or persistent vomiting) - No   Past Medical History:  Diagnosis Date   Arthritis    Bronchitis    Cataract    Diabetes mellitus without complication  (HCC)    Emphysema lung (HCC)    GERD (gastroesophageal reflux disease)    Hypercholesteremia    Hypertension       ROS As per HPI.    Objective:     BP 106/60   Pulse 61   Temp 98.2 F (36.8 C) (Temporal)   Ht 5\' 7"  (1.702 m)   Wt 145 lb (65.8 kg)   SpO2 94%   BMI 22.71 kg/m    Physical Exam Vitals and nursing note reviewed.  Constitutional:      General: He is not in acute distress.    Appearance: Normal appearance. He is not ill-appearing, toxic-appearing or diaphoretic.  Neck:     Vascular: No carotid bruit.  Cardiovascular:     Rate and Rhythm: Normal rate and regular rhythm.     Heart sounds: Normal heart sounds. No murmur heard. Pulmonary:     Effort: Pulmonary effort is normal. No respiratory distress.     Breath sounds: Normal breath sounds. No wheezing, rhonchi or rales.  Abdominal:     General: Bowel sounds are normal. There is no distension.     Palpations: Abdomen is soft.     Tenderness: There is no abdominal tenderness. There is no guarding or rebound.  Musculoskeletal:     Cervical back: Neck supple. No rigidity.     Right lower leg: No edema.     Left lower leg: No edema.  Skin:    General: Skin is warm and dry.  Neurological:  General: No focal deficit present.     Mental Status: He is alert and oriented to person, place, and time.     Motor: No weakness.     Gait: Gait normal.  Psychiatric:        Mood and Affect: Mood normal.        Behavior: Behavior normal.    No results found for any visits on 12/20/22.  The ASCVD Risk score (Arnett DK, et al., 2019) failed to calculate for the following reasons:   The valid total cholesterol range is 130 to 320 mg/dL    Assessment & Plan:   Mcclain was seen today for medical management of chronic issues, diabetes and hypertension.  Diagnoses and all orders for this visit:  Type 2 diabetes mellitus with diabetic neuropathy, without long-term current use of insulin (HCC) A1c 7.0 today,  not at goal of <7. Medication changes today: none. Discussed compliance with diet and meds as he has been out of [place] for 1 week . He has declined ACE/ARB. He is on a statin. Eye exam: reminded to scheduled. Foot exam: UTD. Urine micro: UTD. Diet and exercise.  -     Bayer DCA Hb A1c Waived -     CMP14+EGFR -     sitaGLIPtin (JANUVIA) 100 MG tablet; Take 1 tablet (100 mg total) by mouth daily. -     dapagliflozin propanediol (FARXIGA) 10 MG TABS tablet; Take 1 tablet (10 mg total) by mouth daily before breakfast. -     gabapentin (NEURONTIN) 600 MG tablet; Take 0.5 tablet by mouth in the morning then take a full tablet by mouth at bedtime. -     metFORMIN (GLUCOPHAGE) 1000 MG tablet; Take 1 tablet (1,000 mg total) by mouth 2 (two) times daily with a meal. -     Ambulatory referral to Cardiology  Hypertension associated with diabetes (HCC) Well controlled on current regimen.  -     amLODipine (NORVASC) 2.5 MG tablet; Take 1 tablet (2.5 mg total) by mouth daily. -     Ambulatory referral to Cardiology  Chest pain, unspecified type EKG with NSR today, no changes from previous. STAT referral to cardiology for further evaluation of chest pain, dizziness given hx of CAD and HTN. Strict return precautions given. Discussed when to seek emergency care.  -     EKG 12-Lead -     Ambulatory referral to Cardiology  Dizziness -     EKG 12-Lead -     Ambulatory referral to Cardiology  Coronary artery disease involving native coronary artery of native heart with unstable angina pectoris (HCC) On statin and aspirin.  -     Ambulatory referral to Cardiology  Hyperlipidemia associated with type 2 diabetes mellitus (HCC) Last LDL at goal. On statin.   Centrilobular emphysema (HCC) Managed by pulmonology. Has follow up next week.  -     albuterol (VENTOLIN HFA) 108 (90 Base) MCG/ACT inhaler; Inhale 2 puffs into the lungs every 6 (six) hours as needed for wheezing or shortness of  breath.  Gastroesophageal reflux disease without esophagitis Well controlled on current regimen.  -     omeprazole (PRILOSEC) 20 MG capsule; Take 1 capsule (20 mg total) by mouth daily.  Chronic pain of left knee Use very sparingly. -     meloxicam (MOBIC) 15 MG tablet; Take 1 tablet (15 mg total) by mouth daily.  Tobacco use disorder Not ready to quit.    Return in about 3 months (around 03/22/2023), or  if symptoms worsen or fail to improve, for chronic follow up.  The patient indicates understanding of these issues and agrees with the plan.  Gabriel Earing, FNP

## 2022-12-23 ENCOUNTER — Telehealth: Payer: Self-pay | Admitting: Acute Care

## 2022-12-23 DIAGNOSIS — Z87891 Personal history of nicotine dependence: Secondary | ICD-10-CM

## 2022-12-23 DIAGNOSIS — F17210 Nicotine dependence, cigarettes, uncomplicated: Secondary | ICD-10-CM

## 2022-12-23 DIAGNOSIS — R911 Solitary pulmonary nodule: Secondary | ICD-10-CM

## 2022-12-23 LAB — SPECIMEN STATUS REPORT

## 2022-12-23 NOTE — Telephone Encounter (Signed)
I have called the patient with the results of his low-dose screening CT.  The patient is at work and I spoke with his wife, Osinachi Zuccarello, who is listed on his DPR. I explained that there are 2 nodules that require close follow-up in 3 months.  1 of these nodules is in the apex of the right upper lobe and is 13.3 mm.  Per radiology this is favored to represent chronic postinfectious or inflammatory scarring.  There is another prominent partially cavitary nodule in the left lower lobe measuring 10.3 mm. Per radiology both of these nodules warrant close attention at follow-up. Plan is for a follow-up low-dose CT in 3 months which will be October 31 or after. Patient's wife verbalized understanding and is in agreement with this plan. Additionally we discussed that there was notation of coronary artery disease, specifically aortic atherosclerosis in addition to left main and three-vessel coronary artery disease. Per patient wife he is followed by cardiology, I have added Dr. Herbie Baltimore to this message to make sure he is aware of the findings.. Additionally there was notation of fatty liver.  Per the wife the patient has had an increase in his liver function tests.  I have also added patient's primary care provider to this message so she is aware. Patient is scheduled to see Dr. Vassie Loll 01/02/2023, who sees the patient for pulmonary issues. Dr. Vassie Loll I wanted to make sure you were aware of the screening scan results and the plan to do a follow-up scan in 3 months.  D, D, and E, please order 3 month follow up. I have already faxed results to PCP. Thanks so much all Thank you so much

## 2022-12-23 NOTE — Telephone Encounter (Signed)
3 month follow up CT order has been placed.

## 2022-12-26 LAB — ALKALINE PHOSPHATASE, ISOENZYMES
Alkaline Phosphatase: 145 IU/L — ABNORMAL HIGH (ref 44–121)
BONE FRACTION: 21 % (ref 12–68)
INTESTINAL FRAC.: 0 % (ref 0–18)
LIVER FRACTION: 79 % (ref 13–88)

## 2022-12-29 ENCOUNTER — Ambulatory Visit (HOSPITAL_BASED_OUTPATIENT_CLINIC_OR_DEPARTMENT_OTHER): Payer: 59 | Admitting: Pulmonary Disease

## 2022-12-29 ENCOUNTER — Encounter (HOSPITAL_BASED_OUTPATIENT_CLINIC_OR_DEPARTMENT_OTHER): Payer: Self-pay | Admitting: Pulmonary Disease

## 2022-12-29 VITALS — BP 122/74 | HR 65 | Ht 67.0 in | Wt 145.0 lb

## 2022-12-29 DIAGNOSIS — F17200 Nicotine dependence, unspecified, uncomplicated: Secondary | ICD-10-CM

## 2022-12-29 DIAGNOSIS — J432 Centrilobular emphysema: Secondary | ICD-10-CM | POA: Diagnosis not present

## 2022-12-29 DIAGNOSIS — R911 Solitary pulmonary nodule: Secondary | ICD-10-CM

## 2022-12-29 NOTE — Assessment & Plan Note (Signed)
Nodule of concern is in the left lower lobe measuring 10 mm, this is borderline resolution for PET scan.  At this time 36-month follow-up CT scan will be scheduled.  If this is stable in size then it can be followed with continued CT imaging but if increasing then can consider PET scan. I explained to him that his weight loss is not related to this nodule.  He needs to undergo colonoscopy screening for colon cancer

## 2022-12-29 NOTE — Patient Instructions (Addendum)
3 month FU scan  21 mg nicotine patch Call 1800 QUIT Line

## 2022-12-29 NOTE — Assessment & Plan Note (Signed)
He has stage II/III COPD.  Continue Trelegy. Smoking cessation was emphasized. We discussed COPD action plan and signs and symptoms of exacerbation

## 2022-12-29 NOTE — Progress Notes (Signed)
   Subjective:    Patient ID: Larry Floyd, male    DOB: Nov 28, 1960, 62 y.o.   MRN: [id number]  HPI  62 yo heavy smoker for FU of COPD He smokes 3 packs/day starting as a teenager, more than 120 pack years.  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  PMH -diabetes type 2 CAD Hypertension  68-month follow-up visit He continues to smoke but has cut down to 1.5 packs/day. He arrives with his wife and daughter.  He complains of weight loss.  He has lost 8 pounds since his last visit.  He has not had a colonoscopy yet. We reviewed CT and PFTs. He remains on Trelegy  He is willing to try nicotine patches.  He has tried Chantix in the past and was able to cut down significantly but then his primary care took him off  Significant tests/ events reviewed  LDCT chest 12/2022 -centrilobular emphysema, right upper lobe 13 mm?  Scarring, left lower lobe 10 mm nodule?  Cavitary, fatty liver.  PFT 7/24 moderate airway obstruction, ratio 58, FEV1 73%, FVC 96%, TLC normal, DLCO 12.1/48%  Review of Systems neg for any significant sore throat, dysphagia, itching, sneezing, nasal congestion or excess/ purulent secretions, fever, chills, sweats, unintended wt loss, pleuritic or exertional cp, hempoptysis, orthopnea pnd or change in chronic leg swelling. Also denies presyncope, palpitations, heartburn, abdominal pain, nausea, vomiting, diarrhea or change in bowel or urinary habits, dysuria,hematuria, rash, arthralgias, visual complaints, headache, numbness weakness or ataxia.     Objective:   Physical Exam  Gen. Pleasant, well-nourished, in no distress ENT - no thrush, no pallor/icterus,no post nasal drip Neck: No JVD, no thyromegaly, no carotid bruits Lungs: no use of accessory muscles, no dullness to percussion, clear without rales or rhonchi  Cardiovascular: Rhythm regular, heart sounds  normal, no murmurs or gallops, no peripheral edema Musculoskeletal: No deformities, no cyanosis  or clubbing        Assessment & Plan:

## 2022-12-29 NOTE — Assessment & Plan Note (Signed)
Smoking cessation was emphasized is the most important intervention that would add years to his life.  He is willing to try nicotine patches 21 mg. We can also trial varenicline in the future

## 2022-12-30 ENCOUNTER — Telehealth: Payer: Self-pay | Admitting: Acute Care

## 2022-12-30 NOTE — Telephone Encounter (Signed)
Results/ f/u plans faxed to PCP. Order was placed for 3 month nodule f/u CT.

## 2022-12-30 NOTE — Telephone Encounter (Addendum)
I have called the patient wife, Burna Mortimer, ( [place])    with the results of his Low Dose Ct Chest. Scan was read as a 4A. He has 2 nodules that need follow up. One is a right upper lobe 13.3 mm nodule ( favored by radiology to represent chronic post infectious or inflammatory scarring) , and a left lower lobe cavitary 10.3  mm nodule. Recommendation is for a 3 month follow up. Patient was seen by Dr. Vassie Loll 12/29/2022. He supported a 3 month follow up, so patient is comfortable with this plan. Sherre Lain, And [place], 3 month follow up.Due after 03/17/2023.  Please fax results to PCP and let them know the plan. Thanks so much

## 2023-01-01 ENCOUNTER — Other Ambulatory Visit: Payer: Self-pay | Admitting: Cardiology

## 2023-01-18 ENCOUNTER — Telehealth: Payer: Self-pay | Admitting: Family Medicine

## 2023-01-18 NOTE — Telephone Encounter (Signed)
Patient aware.

## 2023-01-18 NOTE — Telephone Encounter (Signed)
Needs samples of Trelegy. Call when ready for pick up.

## 2023-01-24 ENCOUNTER — Telehealth: Payer: Self-pay | Admitting: Pulmonary Disease

## 2023-01-24 NOTE — Telephone Encounter (Signed)
Spoke with patient's wife Burna Mortimer [facility]), she reports patient has had nasal congestion since this past Friday and slight cough. She denies fever/chills, increased shob or sick contacts. She states Vassie Loll told patient to never take any OTC medications, to call office if sick.  Please advise, thank you!

## 2023-01-24 NOTE — Telephone Encounter (Signed)
Burna Mortimer wife states patient having symptoms of runny nose, cough and mucus. Pharmacy is [facility] [place]. Burna Mortimer phone number is [phone].

## 2023-01-25 MED ORDER — AZITHROMYCIN 250 MG PO TABS: ORAL_TABLET | ORAL | 0 refills | Status: DC

## 2023-01-25 NOTE — Telephone Encounter (Signed)
Zpak sent in and patients wife has been notified of that and for him to take mucinex as well.

## 2023-01-25 NOTE — Telephone Encounter (Signed)
Patient calling back for an update please advise and call back.

## 2023-02-16 ENCOUNTER — Ambulatory Visit: Payer: No Typology Code available for payment source | Admitting: Cardiology

## 2023-03-23 ENCOUNTER — Other Ambulatory Visit: Payer: 59

## 2023-03-23 ENCOUNTER — Ambulatory Visit (INDEPENDENT_AMBULATORY_CARE_PROVIDER_SITE_OTHER): Payer: 59 | Admitting: Family Medicine

## 2023-03-23 ENCOUNTER — Encounter: Payer: Self-pay | Admitting: Family Medicine

## 2023-03-23 VITALS — BP 124/73 | HR 71 | Temp 98.1°F | Ht 67.0 in | Wt 145.4 lb

## 2023-03-23 DIAGNOSIS — R195 Other fecal abnormalities: Secondary | ICD-10-CM

## 2023-03-23 DIAGNOSIS — E1169 Type 2 diabetes mellitus with other specified complication: Secondary | ICD-10-CM

## 2023-03-23 DIAGNOSIS — E1140 Type 2 diabetes mellitus with diabetic neuropathy, unspecified: Secondary | ICD-10-CM

## 2023-03-23 DIAGNOSIS — R42 Dizziness and giddiness: Secondary | ICD-10-CM

## 2023-03-23 DIAGNOSIS — J432 Centrilobular emphysema: Secondary | ICD-10-CM

## 2023-03-23 DIAGNOSIS — I152 Hypertension secondary to endocrine disorders: Secondary | ICD-10-CM

## 2023-03-23 DIAGNOSIS — E1159 Type 2 diabetes mellitus with other circulatory complications: Secondary | ICD-10-CM | POA: Diagnosis not present

## 2023-03-23 DIAGNOSIS — F17200 Nicotine dependence, unspecified, uncomplicated: Secondary | ICD-10-CM

## 2023-03-23 DIAGNOSIS — K219 Gastro-esophageal reflux disease without esophagitis: Secondary | ICD-10-CM

## 2023-03-23 DIAGNOSIS — F411 Generalized anxiety disorder: Secondary | ICD-10-CM

## 2023-03-23 DIAGNOSIS — I25110 Atherosclerotic heart disease of native coronary artery with unstable angina pectoris: Secondary | ICD-10-CM

## 2023-03-23 DIAGNOSIS — E785 Hyperlipidemia, unspecified: Secondary | ICD-10-CM

## 2023-03-23 LAB — BAYER DCA HB A1C WAIVED: HB A1C (BAYER DCA - WAIVED): 6.5 % — ABNORMAL HIGH (ref 4.8–5.6)

## 2023-03-23 MED ORDER — OMEPRAZOLE 40 MG PO CPDR: 40.0000 mg | DELAYED_RELEASE_CAPSULE | Freq: Every day | ORAL | 3 refills | Status: DC

## 2023-03-23 MED ORDER — SERTRALINE HCL 50 MG PO TABS: 50.0000 mg | ORAL_TABLET | Freq: Every day | ORAL | 3 refills | Status: DC

## 2023-03-23 MED ORDER — ALBUTEROL SULFATE HFA 108 (90 BASE) MCG/ACT IN AERS: 2.0000 | INHALATION_SPRAY | Freq: Four times a day (QID) | RESPIRATORY_TRACT | 2 refills | Status: DC | PRN

## 2023-03-23 NOTE — Progress Notes (Signed)
Established Patient Office Visit  Subjective   Patient ID: Larry Floyd, male    DOB: 11-15-1960  Age: 62 y.o. MRN: [id number]  Chief Complaint  Patient presents with   Medical Management of Chronic Issues   Diabetes   Hyperlipidemia   Hypertension    HPI T2DM Pt presents for follow up evaluation of Type 2 diabetes mellitus. Patient denies foot ulcerations, increased appetite, nausea, paresthesia of the feet, polydipsia, polyuria, visual disturbances, vomiting, and weight loss.  Current diabetic medications include farxiga 10 mg, januvia 100 mg, metformin 1000 mg BID Compliant with meds -  Current monitoring regimen: none Current diet: eating better Current exercise: none  Urine microalbumin UTD? Yes Is He on ACE inhibitor or angiotensin II receptor blocker?  No, declines Is He on statin? Yes atorvastatin Is He on ASA 81 mg daily?  Yes  2. HTN Complaint with meds - Yes Current Medications - amlodipine, bisoprolol Checking BP at home ranging 100s/60s Pertinent ROS:  Headache - No Fatigue - yes chronic Visual Disturbances - No Chest pain - Come and go at rest, for a few minutes. Unchanged. Has appt with cardiology in January Dizziness - Still has dizziness throughout the day with activity and at rest Dyspnea - baseline Palpitations - No LE edema - No  3. COPD Now established with pulmonology. Has been out of trelegy for a week. Increased cough with this. SHOB at baseline. Has repeat CT scan in a few weeks. Called 1-800-QUIT for nicotine patches but these have not been sent out yet.   4. GERD Compliant with medications - Yes Current medications - omeprazole 20 mg daily Heartburn- daily Voice change - No Hemoptysis - No Dysphagia or dyspepsia - No Water brash - No Red Flags (weight loss, hematochezia, melena, weight loss, early satiety, fevers, odynophagia, or persistent vomiting) - No  5. Anxiety Reports increase anxiety for months now. Reports  restlessness, difficulty sleeping, increased worry. Daily. Would like to try medication to help.      03/23/2023    2:19 PM 12/20/2022    2:06 PM 09/08/2022    2:59 PM  Depression screen PHQ 2/9  Decreased Interest 0 0 0  Down, Depressed, Hopeless 0 0 0  PHQ - 2 Score 0 0 0  Altered sleeping 0 0 0  Tired, decreased energy 0 3 0  Change in appetite 0 0 0  Feeling bad or failure about yourself  0 0 0  Trouble concentrating 0 0 0  Moving slowly or fidgety/restless 0 0 0  Suicidal thoughts 0 0 0  PHQ-9 Score 0 3 0  Difficult doing work/chores Not difficult at all Not difficult at all Not difficult at all      03/23/2023    2:19 PM 12/20/2022    2:06 PM 09/08/2022    2:59 PM 06/09/2022    2:43 PM  GAD 7 : Generalized Anxiety Score  Nervous, Anxious, on Edge 0 0 0 0  Control/stop worrying 0 0 0 0  Worry too much - different things 0 0 0 0  Trouble relaxing 0 0 0 0  Restless 0 0 0 0  Easily annoyed or irritable 0 0 0 0  Afraid - awful might happen 0 0 0 0  Total GAD 7 Score 0 0 0 0  Anxiety Difficulty Not difficult at all Not difficult at all Not difficult at all Not difficult at all      Past Medical History:  Diagnosis Date   Arthritis  Bronchitis    Cataract    Diabetes mellitus without complication (HCC)    Emphysema lung (HCC)    GERD (gastroesophageal reflux disease)    Hypercholesteremia    Hypertension       ROS As per HPI.    Objective:     BP 124/73   Pulse 71   Temp 98.1 F (36.7 C) (Temporal)   Ht 5\' 7"  (1.702 m)   Wt 145 lb 6 oz (65.9 kg)   SpO2 97%   BMI 22.77 kg/m   Wt Readings from Last 3 Encounters:  03/23/23 145 lb 6 oz (65.9 kg)  12/29/22 145 lb (65.8 kg)  12/20/22 145 lb (65.8 kg)     Physical Exam Vitals and nursing note reviewed.  Constitutional:      General: He is not in acute distress.    Appearance: Normal appearance. He is not ill-appearing, toxic-appearing or diaphoretic.  Neck:     Vascular: No carotid bruit.   Cardiovascular:     Rate and Rhythm: Normal rate and regular rhythm.     Heart sounds: Normal heart sounds. No murmur heard. Pulmonary:     Effort: Pulmonary effort is normal. No respiratory distress.     Breath sounds: Normal breath sounds. No wheezing, rhonchi or rales.  Abdominal:     General: Bowel sounds are normal. There is no distension.     Palpations: Abdomen is soft.     Tenderness: There is no abdominal tenderness. There is no guarding or rebound.  Musculoskeletal:     Cervical back: Neck supple. No rigidity.     Right lower leg: No edema.     Left lower leg: No edema.  Skin:    General: Skin is warm and dry.  Neurological:     General: No focal deficit present.     Mental Status: He is alert and oriented to person, place, and time.     Motor: No weakness.     Gait: Gait normal.  Psychiatric:        Mood and Affect: Mood normal.        Behavior: Behavior normal.    No results found for any visits on 03/23/23.  The ASCVD Risk score (Arnett DK, et al., 2019) failed to calculate for the following reasons:   The valid total cholesterol range is 130 to 320 mg/dL    Assessment & Plan:   Larry Floyd was seen today for medical management of chronic issues, diabetes, hyperlipidemia and hypertension.  Diagnoses and all orders for this visit:  Type 2 diabetes mellitus with diabetic neuropathy, without long-term current use of insulin (HCC) A1c 6.5 today, at goal of <7. Medication changes today: none continue current regimen. He has declined ACE/ARB. He is on a statin. Eye exam: reminded to schedule. Foot exam: UTD. Urine micro: today. Diet and exercise.  -     Microalbumin / creatinine urine ratio -     Bayer DCA Hb A1c Waived  Hypertension associated with diabetes (HCC) Well controlled on current regimen.  -     CBC with Differential/Platelet -     CMP14+EGFR  Hyperlipidemia associated with type 2 diabetes mellitus (HCC) On statin.  -     Lipid  panel  Dizziness Labs pending. Zio applied for further evaluation.  -     CBC with Differential/Platelet -     CMP14+EGFR -     LONG TERM MONITOR (3-14 DAYS); Future  Coronary artery disease involving native coronary artery of native  heart with unstable angina pectoris (HCC) On aspirin and statin. Follow up with cardiology. Aware of when to seek emergency care.   Gastroesophageal reflux disease without esophagitis Not well controlled. Increase to omeprazole 40 mg daily  -     omeprazole (PRILOSEC) 40 MG capsule; Take 1 capsule (40 mg total) by mouth daily.  Centrilobular emphysema (HCC) Stable. Follow up with pulmology.  -     albuterol (VENTOLIN HFA) 108 (90 Base) MCG/ACT inhaler; Inhale 2 puffs into the lungs every 6 (six) hours as needed for wheezing or shortness of breath.  Tobacco use disorder They will call 1-800- QUIT again for nicotine patches.   Generalized anxiety disorder Start zoloft as below. He will let me know how he is feeling in 4-6 weeks.  -     sertraline (ZOLOFT) 50 MG tablet; Take 1 tablet (50 mg total) by mouth daily.  Positive colorectal cancer screening using Cologuard test Declined referral for colonoscopy. Aware that this is concerning for possible colon cancer and needs colonoscopy for further evaluation.    Return in about 3 months (around 06/23/2023) for CPE.  The patient indicates understanding of these issues and agrees with the plan.  Gabriel Earing, FNP

## 2023-03-24 LAB — CBC WITH DIFFERENTIAL/PLATELET
Basophils Absolute: 0 10*3/uL (ref 0.0–0.2)
Basos: 0 %
EOS (ABSOLUTE): 0.2 10*3/uL (ref 0.0–0.4)
Eos: 2 %
Hematocrit: 42.6 % (ref 37.5–51.0)
Hemoglobin: 13.8 g/dL (ref 13.0–17.7)
Immature Grans (Abs): 0 10*3/uL (ref 0.0–0.1)
Immature Granulocytes: 0 %
Lymphocytes Absolute: 2.4 10*3/uL (ref 0.7–3.1)
Lymphs: 20 %
MCH: 28.6 pg (ref 26.6–33.0)
MCHC: 32.4 g/dL (ref 31.5–35.7)
MCV: 88 fL (ref 79–97)
Monocytes Absolute: 1 10*3/uL — ABNORMAL HIGH (ref 0.1–0.9)
Monocytes: 8 %
Neutrophils Absolute: 8.5 10*3/uL — ABNORMAL HIGH (ref 1.4–7.0)
Neutrophils: 70 %
Platelets: 401 10*3/uL (ref 150–450)
RBC: 4.82 x10E6/uL (ref 4.14–5.80)
RDW: 15.7 % — ABNORMAL HIGH (ref 11.6–15.4)
WBC: 12.2 10*3/uL — ABNORMAL HIGH (ref 3.4–10.8)

## 2023-03-24 LAB — CMP14+EGFR
ALT: 20 [IU]/L (ref 0–44)
AST: 22 [IU]/L (ref 0–40)
Albumin: 4.5 g/dL (ref 3.9–4.9)
Alkaline Phosphatase: 137 [IU]/L — ABNORMAL HIGH (ref 44–121)
BUN/Creatinine Ratio: 13 (ref 10–24)
BUN: 10 mg/dL (ref 8–27)
Bilirubin Total: 0.3 mg/dL (ref 0.0–1.2)
CO2: 23 mmol/L (ref 20–29)
Calcium: 9.6 mg/dL (ref 8.6–10.2)
Chloride: 98 mmol/L (ref 96–106)
Creatinine, Ser: 0.8 mg/dL (ref 0.76–1.27)
Globulin, Total: 3.1 g/dL (ref 1.5–4.5)
Glucose: 101 mg/dL — ABNORMAL HIGH (ref 70–99)
Potassium: 4.6 mmol/L (ref 3.5–5.2)
Sodium: 135 mmol/L (ref 134–144)
Total Protein: 7.6 g/dL (ref 6.0–8.5)
eGFR: 100 mL/min/{1.73_m2} (ref >59)

## 2023-03-24 LAB — MICROALBUMIN / CREATININE URINE RATIO
Creatinine, Urine: 29.1 mg/dL
Microalb/Creat Ratio: 39 mg/g{creat} — ABNORMAL HIGH (ref 0–29)
Microalbumin, Urine: 11.3 ug/mL

## 2023-03-24 LAB — LIPID PANEL
Chol/HDL Ratio: 3.3 ratio (ref 0.0–5.0)
Cholesterol, Total: 129 mg/dL (ref 100–199)
HDL: 39 mg/dL — ABNORMAL LOW (ref >39)
LDL Chol Calc (NIH): 66 mg/dL (ref 0–99)
Triglycerides: 135 mg/dL (ref 0–149)
VLDL Cholesterol Cal: 24 mg/dL (ref 5–40)

## 2023-03-28 ENCOUNTER — Ambulatory Visit (HOSPITAL_COMMUNITY): Admission: RE | Admit: 2023-03-28 | Payer: 59 | Source: Ambulatory Visit

## 2023-03-31 ENCOUNTER — Ambulatory Visit: Payer: 59

## 2023-04-07 ENCOUNTER — Ambulatory Visit (HOSPITAL_BASED_OUTPATIENT_CLINIC_OR_DEPARTMENT_OTHER): Payer: PRIVATE HEALTH INSURANCE | Admitting: Pulmonary Disease

## 2023-04-07 ENCOUNTER — Encounter (HOSPITAL_BASED_OUTPATIENT_CLINIC_OR_DEPARTMENT_OTHER): Payer: Self-pay | Admitting: Pulmonary Disease

## 2023-04-12 ENCOUNTER — Encounter: Payer: Self-pay | Admitting: *Deleted

## 2023-04-19 ENCOUNTER — Ambulatory Visit: Payer: Self-pay | Admitting: Family Medicine

## 2023-04-19 NOTE — Telephone Encounter (Signed)
Copied from CRM [id number]. Topic: Clinical - Red Word Triage >> Apr 19, 2023  9:47 AM Larry Floyd wrote: Red Word that prompted transfer to Nurse Triage: Chest pain when he coughs, really bad headache.  Chief Complaint: left chest pain Symptoms: left chest pain with cough, SOB, lightheadedness, dizziness Frequency: since yesterday Pertinent Negatives: Patient denies n/v Disposition: [x] ED /[] Urgent Care (no appt availability in office) / [] Appointment(In office/virtual)/ []  [facility]/ [] Home Care/ [] Refused Recommended Disposition /[] [facility]/ []  Follow-up with PCP Additional Notes: pt's wife called c/o pt having lefet chest pain with non-productive cough, hand pain at times and headache that started yesterday and continues without relief.  States SOB but pt has COPD.  Requested appt with PCP and no appt available: Nurse advised to go ED: wife stated she would attempt to get him to go ED but unsure because pt only wants to PCP.   Reason for Disposition  SEVERE chest pain  Answer Assessment - Initial Assessment Questions 1. LOCATION: "Where does it hurt?"       Left chest pain  2. RADIATION: "Does the pain go anywhere else?" (e.g., into neck, jaw, arms, back)     no 3. ONSET: "When did the chest pain begin?" (Minutes, hours or days)      Yesterday  4. PATTERN: "Does the pain come and go, or has it been constant since it started?"  "Does it get worse with exertion?"      Pain with coughing  5. DURATION: "How long does it last" (e.g., seconds, minutes, hours)     unknonw 6. SEVERITY: "How bad is the pain?"  (e.g., Scale 1-10; mild, moderate, or severe)    - MILD (1-3): doesn't interfere with normal activities     - MODERATE (4-7): interferes with normal activities or awakens from sleep    - SEVERE (8-10): excruciating pain, unable to do any normal activities       N/a 7. CARDIAC RISK FACTORS: "Do you have any history of heart problems or risk factors for heart disease?"  (e.g., angina, prior heart attack; diabetes, high blood pressure, high cholesterol, smoker, or strong family history of heart disease)     N/a 8. PULMONARY RISK FACTORS: "Do you have any history of lung disease?"  (e.g., blood clots in lung, asthma, emphysema, birth control pills)     COPD 9. CAUSE: "What do you think is causing the chest pain?"    unknonwn 10. OTHER SYMPTOMS: "Do you have any other symptoms?" (e.g., dizziness, nausea, vomiting, sweating, fever, difficulty breathing, cough)       Lightheadedness, dizziness at times, cough, SOB, headache 11. PREGNANCY: "Is there any chance you are pregnant?" "When was your last menstrual period?"       no  Protocols used: Chest Pain-A-AH

## 2023-04-19 NOTE — Telephone Encounter (Signed)
Mail box not taking messages

## 2023-04-20 ENCOUNTER — Encounter (HOSPITAL_COMMUNITY): Payer: Self-pay

## 2023-04-20 ENCOUNTER — Emergency Department (HOSPITAL_COMMUNITY)
Admission: EM | Admit: 2023-04-20 | Discharge: 2023-04-21 | Disposition: A | Discharge status: Home / Self Care | Payer: 59 | Attending: Emergency Medicine | Admitting: Emergency Medicine

## 2023-04-20 ENCOUNTER — Emergency Department (HOSPITAL_COMMUNITY): Payer: 59

## 2023-04-20 ENCOUNTER — Other Ambulatory Visit: Payer: Self-pay

## 2023-04-20 DIAGNOSIS — S1191XA Laceration without foreign body of unspecified part of neck, initial encounter: Secondary | ICD-10-CM

## 2023-04-20 DIAGNOSIS — S1091XA Abrasion of unspecified part of neck, initial encounter: Secondary | ICD-10-CM | POA: Insufficient documentation

## 2023-04-20 DIAGNOSIS — Z7951 Long term (current) use of inhaled steroids: Secondary | ICD-10-CM | POA: Diagnosis not present

## 2023-04-20 DIAGNOSIS — I2510 Atherosclerotic heart disease of native coronary artery without angina pectoris: Secondary | ICD-10-CM | POA: Insufficient documentation

## 2023-04-20 DIAGNOSIS — R0781 Pleurodynia: Secondary | ICD-10-CM | POA: Insufficient documentation

## 2023-04-20 DIAGNOSIS — W19XXXA Unspecified fall, initial encounter: Secondary | ICD-10-CM | POA: Diagnosis not present

## 2023-04-20 DIAGNOSIS — S0990XA Unspecified injury of head, initial encounter: Secondary | ICD-10-CM

## 2023-04-20 DIAGNOSIS — J449 Chronic obstructive pulmonary disease, unspecified: Secondary | ICD-10-CM | POA: Insufficient documentation

## 2023-04-20 DIAGNOSIS — E119 Type 2 diabetes mellitus without complications: Secondary | ICD-10-CM | POA: Diagnosis not present

## 2023-04-20 DIAGNOSIS — Z79899 Other long term (current) drug therapy: Secondary | ICD-10-CM | POA: Diagnosis not present

## 2023-04-20 DIAGNOSIS — S20211A Contusion of right front wall of thorax, initial encounter: Secondary | ICD-10-CM

## 2023-04-20 DIAGNOSIS — L03114 Cellulitis of left upper limb: Secondary | ICD-10-CM

## 2023-04-20 DIAGNOSIS — Z7982 Long term (current) use of aspirin: Secondary | ICD-10-CM | POA: Diagnosis not present

## 2023-04-20 DIAGNOSIS — I10 Essential (primary) hypertension: Secondary | ICD-10-CM | POA: Insufficient documentation

## 2023-04-20 DIAGNOSIS — S0003XA Contusion of scalp, initial encounter: Secondary | ICD-10-CM | POA: Diagnosis not present

## 2023-04-20 DIAGNOSIS — Z7984 Long term (current) use of oral hypoglycemic drugs: Secondary | ICD-10-CM | POA: Diagnosis not present

## 2023-04-20 NOTE — ED Triage Notes (Addendum)
Pt has had several falls today after consuming ETOH says he "drank a fifth today". Pt brought in by sister n law and granddaughter who report pt lives with daughter and her spouse. Pt says he doesn't drink daily, pt says he just got "in a drinking mood, stressed out" pt has skin tear to right side of neck, EMS performed wound care to this earlier in the day, but pt didn't want to go. Pt says he is on blood thinners and didn't want to come but family insists he be checked out.

## 2023-04-20 NOTE — ED Provider Notes (Signed)
[place] EMERGENCY DEPARTMENT AT [facility] Provider Note   CSN: [id number] Arrival date & time: 04/20/23  2233     History  Chief Complaint  Patient presents with   Larry Floyd is a 62 y.o. male.  Patient is a 62 year old male with history of hypertension, hyperlipidemia, COPD, Type 2 Diabetes, CAD.  Patient presenting today with complaints of a fall.  Patient reports consuming alcohol throughout the day.  He has apparently been stressed out due to his wife recently leaving him.  This evening he fell and struck his head.  He also caused an abrasion to the right side of his neck and also injured his right ribs.  Patient recalls little about the fall.  He reports pain to the right ribs, but has no other complaints at present.  The history is provided by the patient.       Home Medications Prior to Admission medications   Medication Sig Start Date End Date Taking? Authorizing Provider  albuterol (VENTOLIN HFA) 108 (90 Base) MCG/ACT inhaler Inhale 2 puffs into the lungs every 6 (six) hours as needed for wheezing or shortness of breath. 03/23/23   Gabriel Earing, FNP  amLODipine (NORVASC) 2.5 MG tablet Take 1 tablet (2.5 mg total) by mouth daily. 12/20/22   Gabriel Earing, FNP  aspirin EC 81 MG tablet Take 81 mg by mouth in the morning. Swallow whole.    [provider]  atorvastatin (LIPITOR) 40 MG tablet Take 1 tablet (40 mg total) by mouth daily. Patient taking differently: Take 40 mg by mouth every evening. 11/16/21   Marykay Lex, MD  bisoprolol (ZEBETA) 5 MG tablet Take 1 tablet by mouth once daily 01/03/23   Marykay Lex, MD  cetirizine (ZYRTEC) 10 MG tablet Take 0.5 tablets (5 mg total) by mouth daily. Patient taking differently: Take 10 mg by mouth daily. 04/13/21   Jacquelin Hawking, PA-C  dapagliflozin propanediol (FARXIGA) 10 MG TABS tablet Take 1 tablet (10 mg total) by mouth daily before breakfast. 12/20/22   Gabriel Earing, FNP   Fluticasone-Umeclidin-Vilant (TRELEGY ELLIPTA) 100-62.5-25 MCG/ACT AEPB Inhale 1 Inhalation into the lungs daily. 07/28/22   Gabriel Earing, FNP  gabapentin (NEURONTIN) 600 MG tablet Take 0.5 tablet by mouth in the morning then take a full tablet by mouth at bedtime. 12/20/22   Gabriel Earing, FNP  meloxicam (MOBIC) 15 MG tablet Take 1 tablet (15 mg total) by mouth daily. 12/20/22   Gabriel Earing, FNP  metFORMIN (GLUCOPHAGE) 1000 MG tablet Take 1 tablet (1,000 mg total) by mouth 2 (two) times daily with a meal. 12/20/22   Gabriel Earing, FNP  Omega-3 Fatty Acids (FISH OIL) 1000 MG CPDR Take 1,000 mg by mouth daily. And then increase to 3000 mg daily after 3 weeks 03/19/22   Marykay Lex, MD  omeprazole (PRILOSEC) 40 MG capsule Take 1 capsule (40 mg total) by mouth daily. 03/23/23   Gabriel Earing, FNP  sertraline (ZOLOFT) 50 MG tablet Take 1 tablet (50 mg total) by mouth daily. 03/23/23   Gabriel Earing, FNP  sitaGLIPtin (JANUVIA) 100 MG tablet Take 1 tablet (100 mg total) by mouth daily. 12/20/22   Gabriel Earing, FNP      Allergies    Patient has no known allergies.    Review of Systems   Review of Systems  All other systems reviewed and are negative.   Physical Exam  Updated Vital Signs BP (!) 146/79 (BP Location: Right Arm)   Pulse 89   Temp 97.6 F (36.4 C) (Oral)   Resp (!) 22   Ht 5\' 7"  (1.702 m)   Wt 65.9 kg   SpO2 94%   BMI 22.75 kg/m  Physical Exam Vitals and nursing note reviewed.  Constitutional:      General: He is not in acute distress.    Appearance: He is well-developed. He is not diaphoretic.  HENT:     Head: Normocephalic.     Comments: There is a contusion/abrasion noted to the right posterior scalp.  No laceration or active bleeding. Eyes:     Extraocular Movements: Extraocular movements intact.     Pupils: Pupils are equal, round, and reactive to light.  Cardiovascular:     Rate and Rhythm: Normal rate and regular rhythm.     Heart  sounds: No murmur heard.    No friction rub.  Pulmonary:     Effort: Pulmonary effort is normal. No respiratory distress.     Breath sounds: Normal breath sounds. No wheezing or rales.  Abdominal:     General: Bowel sounds are normal. There is no distension.     Palpations: Abdomen is soft.     Tenderness: There is no abdominal tenderness.  Musculoskeletal:        General: Normal range of motion.     Cervical back: Normal range of motion and neck supple.  Skin:    General: Skin is warm and dry.  Neurological:     General: No focal deficit present.     Mental Status: He is alert and oriented to person, place, and time.     Cranial Nerves: No cranial nerve deficit.     Motor: No weakness.     Coordination: Coordination normal.     ED Results / Procedures / Treatments   Labs (all labs ordered are listed, but only abnormal results are displayed) Labs Reviewed - No data to display  EKG None  Radiology No results found.  Procedures Procedures  {Document cardiac monitor, telemetry assessment procedure when appropriate:1}  Medications Ordered in ED Medications - No data to display  ED Course/ Medical Decision Making/ A&P   {   Click here for ABCD2, HEART and other calculatorsREFRESH Note before signing :1}                              Medical Decision Making Amount and/or Complexity of Data Reviewed Radiology: ordered.   ***  {Document critical care time when appropriate:1} {Document review of labs and clinical decision tools ie heart score, Chads2Vasc2 etc:1}  {Document your independent review of radiology images, and any outside records:1} {Document your discussion with family members, caretakers, and with consultants:1} {Document social determinants of health affecting pt's care:1} {Document your decision making why or why not admission, treatments were needed:1} Final Clinical Impression(s) / ED Diagnoses Final diagnoses:  None    Rx / DC Orders ED  Discharge Orders     None

## 2023-04-21 MED ORDER — CEPHALEXIN 500 MG PO CAPS: 500.0000 mg | ORAL_CAPSULE | Freq: Four times a day (QID) | ORAL | 0 refills | Status: DC

## 2023-04-21 MED ORDER — CEPHALEXIN 500 MG PO CAPS
500.0000 mg | ORAL_CAPSULE | Freq: Once | ORAL | Status: AC
  Administered 2023-04-21: 500 mg via ORAL
  Filled 2023-04-21: qty 1

## 2023-04-21 NOTE — Discharge Instructions (Signed)
Begin taking Keflex as prescribed.  Refrain from heavy alcohol consumption.  Local wound care with bacitracin and dressing changes twice daily.  Return to the emergency department if you experience any new and/or concerning issues.

## 2023-04-25 ENCOUNTER — Encounter: Payer: Self-pay | Admitting: Family Medicine

## 2023-04-25 ENCOUNTER — Ambulatory Visit (INDEPENDENT_AMBULATORY_CARE_PROVIDER_SITE_OTHER): Payer: 59

## 2023-04-25 ENCOUNTER — Ambulatory Visit (INDEPENDENT_AMBULATORY_CARE_PROVIDER_SITE_OTHER): Payer: 59 | Admitting: Family Medicine

## 2023-04-25 VITALS — BP 119/80 | HR 80 | Temp 98.1°F | Ht 67.0 in | Wt 137.5 lb

## 2023-04-25 DIAGNOSIS — K219 Gastro-esophageal reflux disease without esophagitis: Secondary | ICD-10-CM | POA: Diagnosis not present

## 2023-04-25 DIAGNOSIS — I6523 Occlusion and stenosis of bilateral carotid arteries: Secondary | ICD-10-CM | POA: Insufficient documentation

## 2023-04-25 DIAGNOSIS — J449 Chronic obstructive pulmonary disease, unspecified: Secondary | ICD-10-CM | POA: Diagnosis not present

## 2023-04-25 DIAGNOSIS — W19XXXS Unspecified fall, sequela: Secondary | ICD-10-CM

## 2023-04-25 DIAGNOSIS — I471 Supraventricular tachycardia, unspecified: Secondary | ICD-10-CM | POA: Insufficient documentation

## 2023-04-25 DIAGNOSIS — R0602 Shortness of breath: Secondary | ICD-10-CM

## 2023-04-25 DIAGNOSIS — R634 Abnormal weight loss: Secondary | ICD-10-CM | POA: Insufficient documentation

## 2023-04-25 DIAGNOSIS — F419 Anxiety disorder, unspecified: Secondary | ICD-10-CM | POA: Insufficient documentation

## 2023-04-25 DIAGNOSIS — R0781 Pleurodynia: Secondary | ICD-10-CM

## 2023-04-25 DIAGNOSIS — F1010 Alcohol abuse, uncomplicated: Secondary | ICD-10-CM | POA: Insufficient documentation

## 2023-04-25 MED ORDER — SERTRALINE HCL 100 MG PO TABS: 100.0000 mg | ORAL_TABLET | Freq: Every day | ORAL | 3 refills | Status: DC

## 2023-04-25 MED ORDER — ATORVASTATIN CALCIUM 80 MG PO TABS: 80.0000 mg | ORAL_TABLET | Freq: Every day | ORAL | 3 refills | Status: AC

## 2023-04-25 MED ORDER — METHYLPREDNISOLONE ACETATE 80 MG/ML IJ SUSP
80.0000 mg | Freq: Once | INTRAMUSCULAR | Status: AC
  Administered 2023-04-25: 80 mg via INTRAMUSCULAR

## 2023-04-25 MED ORDER — ESOMEPRAZOLE MAGNESIUM 40 MG PO CPDR
40.0000 mg | DELAYED_RELEASE_CAPSULE | Freq: Every day | ORAL | 3 refills | Status: DC
Start: 2023-04-25 — End: 2023-07-11

## 2023-04-25 NOTE — Progress Notes (Addendum)
Acute Office Visit  Subjective:     Patient ID: Larry Floyd, male    DOB: [birth year], 62 y.o.   MRN: [id number]  Chief Complaint  Patient presents with   Cough    HPI Patient is in today for ER follow up. He was seen after a fall on 04/20/23. He has been drinking alcohol that day. He had a CT of his head done without acute finding. He also injured his ribs on the right side. He had an xray that was negative for acute findings. He continues to have moderate right sided rib pain that is worse with breathing or movement. Increased productive cough with yellow sputum since yesterday. Increase shortness of breath due to pain with breathing. No fevers. Compliant with trelegy. Using albuterol daily.   Continues to lose weight. >10% weight loss over the last year. Has had a positive cologuard. Declines referral for colonoscopy. He has had a decreased appetite, nausea, vomiting, heartburn, epigastric pain. Denies fever, changes in bowel habits, dysphagia, blood in stool. Doesn't eat much. Has had increaed anxiety and stress lately. He has been drinking a pint a day for the last week, with up to a 1/5th of liquor on the day he fell. Prior to the last week, he only drain occasionally. Taking omeprazole 40 mg daily. Had constipation with protonix in the past. He has only eaten tator tots today.   Increased zoloft to 100 mg last week on his own, though he hasn't been compliant everyday with medications over the last week due to alcohol use.   Has appt with cardiology next month. Denies dizziness since his last visit.      04/25/2023    3:12 PM 03/23/2023    2:19 PM 12/20/2022    2:06 PM  Depression screen PHQ 2/9  Decreased Interest 0 0 0  Down, Depressed, Hopeless 0 0 0  PHQ - 2 Score 0 0 0  Altered sleeping 0 0 0  Tired, decreased energy 0 0 3  Change in appetite 0 0 0  Feeling bad or failure about yourself  0 0 0  Trouble concentrating 0 0 0  Moving slowly or fidgety/restless 0 0 0   Suicidal thoughts 0 0 0  PHQ-9 Score 0 0 3  Difficult doing work/chores Not difficult at all Not difficult at all Not difficult at all      04/25/2023    3:11 PM 03/23/2023    2:19 PM 12/20/2022    2:06 PM 09/08/2022    2:59 PM  GAD 7 : Generalized Anxiety Score  Nervous, Anxious, on Edge 0 0 0 0  Control/stop worrying 0 0 0 0  Worry too much - different things 0 0 0 0  Trouble relaxing 0 0 0 0  Restless 0 0 0 0  Easily annoyed or irritable 0 0 0 0  Afraid - awful might happen 0 0 0 0  Total GAD 7 Score 0 0 0 0  Anxiety Difficulty Not difficult at all Not difficult at all Not difficult at all Not difficult at all     ROS As per HPI.      Objective:    BP 119/80   Pulse 80   Temp 98.1 F (36.7 C) (Temporal)   Ht 5\' 7"  (1.702 m)   Wt 137 lb 8 oz (62.4 kg)   SpO2 95%   BMI 21.54 kg/m  Wt Readings from Last 3 Encounters:  04/25/23 137 lb 8 oz (62.4 kg)  04/20/23 145 lb 4.5 oz (65.9 kg)  03/23/23 145 lb 6 oz (65.9 kg)      Physical Exam Vitals and nursing note reviewed.  Constitutional:      General: He is not in acute distress.    Appearance: He is normal weight. He is not ill-appearing, toxic-appearing or diaphoretic.  Neck:     Vascular: No carotid bruit.  Cardiovascular:     Rate and Rhythm: Normal rate and regular rhythm.     Heart sounds: Normal heart sounds. No murmur heard. Pulmonary:     Effort: Pulmonary effort is normal. No respiratory distress.     Breath sounds: Examination of the right-middle field reveals decreased breath sounds. Examination of the right-lower field reveals decreased breath sounds. Decreased breath sounds present. No wheezing, rhonchi or rales.  Chest:     Chest wall: Tenderness (right anterior lower ribs) present.  Abdominal:     General: Bowel sounds are normal. There is no distension.     Palpations: Abdomen is soft.     Tenderness: There is no abdominal tenderness. There is no guarding or rebound.  Musculoskeletal:      Cervical back: Neck supple. No rigidity.     Right lower leg: No edema.     Left lower leg: No edema.  Skin:    General: Skin is warm and dry.  Neurological:     General: No focal deficit present.     Mental Status: He is alert and oriented to person, place, and time.     Motor: No weakness.     Gait: Gait normal.  Psychiatric:        Mood and Affect: Mood normal.        Behavior: Behavior normal.        Thought Content: Thought content normal.        Judgment: Judgment normal.     No results found for any visits on 04/25/23.      Assessment & Plan:    Larry Floyd was seen today for cough.  Diagnoses and all orders for this visit:  Fall, sequela Reviewed ER notes, imaging. Fall with alcohol use.   Rib pain on right side Shortness of breath Chronic obstructive pulmonary disease, unspecified COPD type (HCC) Initially xray negative for fractures. No acute findings on repeat today. Discussed need to regular deep breathing, bracing with breathing, prevention of pneumonia. NSAIDs prn for pain. Steroid IM injection today due increase cough and shortness of breath in the setting of COPD.  -     DG Chest 2 View; Future -     methylPREDNISolone acetate (DEPO-MEDROL) injection 80 mg  Gastroesophageal reflux disease, unspecified whether esophagitis present Uncontrolled with omeprazole. Didn't tolerate protonix due to constipation. Switch to nexium as below.  -     esomeprazole (NEXIUM) 40 MG capsule; Take 1 capsule (40 mg total) by mouth daily at 12 noon.  Alcohol abuse Acute x 1 week. Labs pending. Discussed limiting alcohol use and treating anxiety with increased zoloft dosage.  -     CMP14+EGFR -     CBC with Differential/Platelet -     Ammonia -     Vitamin B1 -     Vitamin B12  Weight loss Poor food intake. Discussed diet. Labs pending. He has had CT lung cancer screening. Discussed colon cancer screening and the importance of this today, however he declined.  -      CMP14+EGFR -     CBC with Differential/Platelet -  TSH -     C-reactive protein -     HIV antibody (with reflex) -     Hepatitis C antibody  Anxiety Increase to 100 mg daily. Reports increased stress and this is the driver for his increase alcohol intake.  -     sertraline (ZOLOFT) 100 MG tablet; Take 1 tablet (100 mg total) by mouth daily.  Calcification of both carotid arteries Noted on CT in ER. Increase statin to 80 mg. Carotid doppler discussed and ordered.  -     US Carotid Duplex Bilateral; Future -     atorvastatin (LIPITOR) 80 MG tablet; Take 1 tablet (80 mg total) by mouth daily.  SVT (supraventricular tachycardia) (HCC) Has appt with cardiology. Noted on zio- no symptoms reported.   Total time spent caring for the patient today was 52 minutes. This includes time spent before the visit reviewing the chart, time spent during the visit, and time spent after the visit on documentation   Keep schedule follow up appts, sooner for new or worsening symptoms.   The patient indicates understanding of these issues and agrees with the plan.  Gabriel Earing, FNP

## 2023-04-25 NOTE — Addendum Note (Signed)
Addended by: Gabriel Earing on: 04/25/2023 04:49 PM   Modules accepted: Level of Service

## 2023-04-26 ENCOUNTER — Encounter: Payer: Self-pay | Admitting: *Deleted

## 2023-04-26 LAB — CBC WITH DIFFERENTIAL/PLATELET
Basophils Absolute: 0 10*3/uL (ref 0.0–0.2)
Basos: 0 %
EOS (ABSOLUTE): 0.1 10*3/uL (ref 0.0–0.4)
Eos: 1 %
Hematocrit: 42.8 % (ref 37.5–51.0)
Hemoglobin: 14.5 g/dL (ref 13.0–17.7)
Immature Grans (Abs): 0.1 10*3/uL (ref 0.0–0.1)
Immature Granulocytes: 1 %
Lymphocytes Absolute: 1.4 10*3/uL (ref 0.7–3.1)
Lymphs: 13 %
MCH: 28.9 pg (ref 26.6–33.0)
MCHC: 33.9 g/dL (ref 31.5–35.7)
MCV: 85 fL (ref 79–97)
Monocytes Absolute: 0.9 10*3/uL (ref 0.1–0.9)
Monocytes: 8 %
Neutrophils Absolute: 8.6 10*3/uL — ABNORMAL HIGH (ref 1.4–7.0)
Neutrophils: 77 %
Platelets: 334 10*3/uL (ref 150–450)
RBC: 5.02 x10E6/uL (ref 4.14–5.80)
RDW: 14.1 % (ref 11.6–15.4)
WBC: 11 10*3/uL — ABNORMAL HIGH (ref 3.4–10.8)

## 2023-04-26 LAB — CMP14+EGFR
ALT: 23 [IU]/L (ref 0–44)
AST: 18 [IU]/L (ref 0–40)
Albumin: 4.3 g/dL (ref 3.9–4.9)
Alkaline Phosphatase: 157 [IU]/L — ABNORMAL HIGH (ref 44–121)
BUN/Creatinine Ratio: 15 (ref 10–24)
BUN: 16 mg/dL (ref 8–27)
Bilirubin Total: 0.2 mg/dL (ref 0.0–1.2)
CO2: 23 mmol/L (ref 20–29)
Calcium: 10 mg/dL (ref 8.6–10.2)
Chloride: 93 mmol/L — ABNORMAL LOW (ref 96–106)
Creatinine, Ser: 1.1 mg/dL (ref 0.76–1.27)
Globulin, Total: 3.3 g/dL (ref 1.5–4.5)
Glucose: 126 mg/dL — ABNORMAL HIGH (ref 70–99)
Potassium: 4.8 mmol/L (ref 3.5–5.2)
Sodium: 132 mmol/L — ABNORMAL LOW (ref 134–144)
Total Protein: 7.6 g/dL (ref 6.0–8.5)
eGFR: 76 mL/min/{1.73_m2} (ref >59)

## 2023-04-26 LAB — TSH: TSH: 2.79 u[IU]/mL (ref 0.450–4.500)

## 2023-04-26 LAB — HIV ANTIBODY (ROUTINE TESTING W REFLEX): HIV Screen 4th Generation wRfx: NONREACTIVE

## 2023-04-26 LAB — C-REACTIVE PROTEIN: CRP: 105 mg/L — ABNORMAL HIGH (ref 0–10)

## 2023-04-26 LAB — HEPATITIS C ANTIBODY: Hep C Virus Ab: NONREACTIVE

## 2023-04-28 NOTE — Telephone Encounter (Signed)
Pt has been seen in office and this has been addressed, will close encounter.

## 2023-05-02 ENCOUNTER — Telehealth: Payer: Self-pay

## 2023-05-02 NOTE — Telephone Encounter (Signed)
[facility] [place] (Key: [id number]) PA Case ID #: [id number] Rx #: [id number] Need Help? Call us at [phone] Status sent iconSent to Plan today Drug Esomeprazole Magnesium 40MG  dr capsules ePA cloud logo Form [employer] Electronic PA Form [phone] NCPDP) Original Claim Info 91

## 2023-05-04 ENCOUNTER — Other Ambulatory Visit (HOSPITAL_COMMUNITY): Payer: Self-pay

## 2023-05-04 NOTE — Telephone Encounter (Signed)
Pharmacy Patient Advocate Encounter  Received notification from  [facility] [place]  that Prior Authorization for ESOMEPRAZOLE 40MG  has been APPROVED from 05/04/23 to 05/03/24. Spoke to pharmacy to process.Copay is $33.30 for 90ds.    PA #/Case ID/Reference #:  [id number]

## 2023-05-04 NOTE — Telephone Encounter (Signed)
Pt aware.

## 2023-05-06 ENCOUNTER — Telehealth: Payer: 59 | Admitting: Family

## 2023-05-06 ENCOUNTER — Other Ambulatory Visit: Payer: Self-pay

## 2023-05-06 ENCOUNTER — Ambulatory Visit: Payer: 59 | Admitting: Family

## 2023-05-06 ENCOUNTER — Encounter: Payer: Self-pay | Admitting: Family

## 2023-05-06 ENCOUNTER — Inpatient Hospital Stay (HOSPITAL_COMMUNITY)
Admission: EM | Admit: 2023-05-06 | Discharge: 2023-05-08 | DRG: 640 | Disposition: A | Discharge status: Home / Self Care | Payer: 59 | Attending: Internal Medicine | Admitting: Internal Medicine

## 2023-05-06 ENCOUNTER — Emergency Department (HOSPITAL_COMMUNITY): Payer: 59

## 2023-05-06 ENCOUNTER — Encounter (HOSPITAL_COMMUNITY): Payer: Self-pay

## 2023-05-06 ENCOUNTER — Ambulatory Visit: Payer: Self-pay | Admitting: Family Medicine

## 2023-05-06 DIAGNOSIS — E1159 Type 2 diabetes mellitus with other circulatory complications: Secondary | ICD-10-CM | POA: Diagnosis present

## 2023-05-06 DIAGNOSIS — E039 Hypothyroidism, unspecified: Secondary | ICD-10-CM | POA: Diagnosis present

## 2023-05-06 DIAGNOSIS — Z7984 Long term (current) use of oral hypoglycemic drugs: Secondary | ICD-10-CM

## 2023-05-06 DIAGNOSIS — Z823 Family history of stroke: Secondary | ICD-10-CM

## 2023-05-06 DIAGNOSIS — I10 Essential (primary) hypertension: Secondary | ICD-10-CM | POA: Diagnosis present

## 2023-05-06 DIAGNOSIS — Z833 Family history of diabetes mellitus: Secondary | ICD-10-CM

## 2023-05-06 DIAGNOSIS — D72829 Elevated white blood cell count, unspecified: Secondary | ICD-10-CM | POA: Insufficient documentation

## 2023-05-06 DIAGNOSIS — Z8249 Family history of ischemic heart disease and other diseases of the circulatory system: Secondary | ICD-10-CM

## 2023-05-06 DIAGNOSIS — I152 Hypertension secondary to endocrine disorders: Secondary | ICD-10-CM | POA: Diagnosis present

## 2023-05-06 DIAGNOSIS — Z79899 Other long term (current) drug therapy: Secondary | ICD-10-CM

## 2023-05-06 DIAGNOSIS — E1165 Type 2 diabetes mellitus with hyperglycemia: Secondary | ICD-10-CM | POA: Diagnosis present

## 2023-05-06 DIAGNOSIS — R1013 Epigastric pain: Principal | ICD-10-CM

## 2023-05-06 DIAGNOSIS — J432 Centrilobular emphysema: Secondary | ICD-10-CM

## 2023-05-06 DIAGNOSIS — J189 Pneumonia, unspecified organism: Secondary | ICD-10-CM

## 2023-05-06 DIAGNOSIS — R109 Unspecified abdominal pain: Secondary | ICD-10-CM | POA: Insufficient documentation

## 2023-05-06 DIAGNOSIS — Z825 Family history of asthma and other chronic lower respiratory diseases: Secondary | ICD-10-CM

## 2023-05-06 DIAGNOSIS — E861 Hypovolemia: Secondary | ICD-10-CM | POA: Diagnosis present

## 2023-05-06 DIAGNOSIS — J690 Pneumonitis due to inhalation of food and vomit: Secondary | ICD-10-CM

## 2023-05-06 DIAGNOSIS — E1169 Type 2 diabetes mellitus with other specified complication: Secondary | ICD-10-CM | POA: Diagnosis present

## 2023-05-06 DIAGNOSIS — Z8719 Personal history of other diseases of the digestive system: Secondary | ICD-10-CM

## 2023-05-06 DIAGNOSIS — Z808 Family history of malignant neoplasm of other organs or systems: Secondary | ICD-10-CM

## 2023-05-06 DIAGNOSIS — E871 Hypo-osmolality and hyponatremia: Secondary | ICD-10-CM | POA: Diagnosis not present

## 2023-05-06 DIAGNOSIS — M1990 Unspecified osteoarthritis, unspecified site: Secondary | ICD-10-CM | POA: Diagnosis present

## 2023-05-06 DIAGNOSIS — J439 Emphysema, unspecified: Secondary | ICD-10-CM | POA: Diagnosis present

## 2023-05-06 DIAGNOSIS — K852 Alcohol induced acute pancreatitis without necrosis or infection: Secondary | ICD-10-CM | POA: Diagnosis present

## 2023-05-06 DIAGNOSIS — Z791 Long term (current) use of non-steroidal anti-inflammatories (NSAID): Secondary | ICD-10-CM

## 2023-05-06 DIAGNOSIS — D75839 Thrombocytosis, unspecified: Secondary | ICD-10-CM | POA: Insufficient documentation

## 2023-05-06 DIAGNOSIS — F17210 Nicotine dependence, cigarettes, uncomplicated: Secondary | ICD-10-CM | POA: Diagnosis present

## 2023-05-06 DIAGNOSIS — J449 Chronic obstructive pulmonary disease, unspecified: Secondary | ICD-10-CM | POA: Diagnosis present

## 2023-05-06 DIAGNOSIS — Z83438 Family history of other disorder of lipoprotein metabolism and other lipidemia: Secondary | ICD-10-CM

## 2023-05-06 DIAGNOSIS — K219 Gastro-esophageal reflux disease without esophagitis: Secondary | ICD-10-CM | POA: Diagnosis present

## 2023-05-06 DIAGNOSIS — R112 Nausea with vomiting, unspecified: Secondary | ICD-10-CM | POA: Diagnosis not present

## 2023-05-06 DIAGNOSIS — K861 Other chronic pancreatitis: Secondary | ICD-10-CM | POA: Diagnosis present

## 2023-05-06 DIAGNOSIS — R748 Abnormal levels of other serum enzymes: Secondary | ICD-10-CM | POA: Insufficient documentation

## 2023-05-06 DIAGNOSIS — F1090 Alcohol use, unspecified, uncomplicated: Secondary | ICD-10-CM | POA: Insufficient documentation

## 2023-05-06 DIAGNOSIS — F17200 Nicotine dependence, unspecified, uncomplicated: Secondary | ICD-10-CM | POA: Diagnosis present

## 2023-05-06 DIAGNOSIS — E782 Mixed hyperlipidemia: Secondary | ICD-10-CM | POA: Diagnosis present

## 2023-05-06 DIAGNOSIS — F1020 Alcohol dependence, uncomplicated: Secondary | ICD-10-CM | POA: Diagnosis present

## 2023-05-06 LAB — URINALYSIS, ROUTINE W REFLEX MICROSCOPIC
Bacteria, UA: NONE SEEN
Bilirubin Urine: NEGATIVE
Glucose, UA: 150 mg/dL — AB
Hgb urine dipstick: NEGATIVE
Ketones, ur: NEGATIVE mg/dL
Leukocytes,Ua: NEGATIVE
Nitrite: NEGATIVE
Protein, ur: 100 mg/dL — AB
Specific Gravity, Urine: 1.021 (ref 1.005–1.030)
pH: 5 (ref 5.0–8.0)

## 2023-05-06 LAB — COMPREHENSIVE METABOLIC PANEL
ALT: 22 U/L (ref 0–44)
AST: 17 U/L (ref 15–41)
Albumin: 3.9 g/dL (ref 3.5–5.0)
Alkaline Phosphatase: 120 U/L (ref 38–126)
Anion gap: 12 (ref 5–15)
BUN: 14 mg/dL (ref 8–23)
CO2: 21 mmol/L — ABNORMAL LOW (ref 22–32)
Calcium: 9.2 mg/dL (ref 8.9–10.3)
Chloride: 88 mmol/L — ABNORMAL LOW (ref 98–111)
Creatinine, Ser: 0.72 mg/dL (ref 0.61–1.24)
GFR, Estimated: >60 mL/min (ref >60)
Glucose, Bld: 165 mg/dL — ABNORMAL HIGH (ref 70–99)
Potassium: 4.3 mmol/L (ref 3.5–5.1)
Sodium: 121 mmol/L — ABNORMAL LOW (ref 135–145)
Total Bilirubin: <0.2 mg/dL (ref <1.2)
Total Protein: 8 g/dL (ref 6.5–8.1)

## 2023-05-06 LAB — CBC
HCT: 40.6 % (ref 39.0–52.0)
Hemoglobin: 13.7 g/dL (ref 13.0–17.0)
MCH: 28.5 pg (ref 26.0–34.0)
MCHC: 33.7 g/dL (ref 30.0–36.0)
MCV: 84.6 fL (ref 80.0–100.0)
Platelets: 507 10*3/uL — ABNORMAL HIGH (ref 150–400)
RBC: 4.8 MIL/uL (ref 4.22–5.81)
RDW: 15.1 % (ref 11.5–15.5)
WBC: 18.8 10*3/uL — ABNORMAL HIGH (ref 4.0–10.5)
nRBC: 0 % (ref 0.0–0.2)

## 2023-05-06 LAB — LIPASE, BLOOD: Lipase: 78 U/L — ABNORMAL HIGH (ref 11–51)

## 2023-05-06 MED ORDER — THIAMINE HCL 100 MG/ML IJ SOLN: 100.0000 mg | Freq: Every day | INTRAMUSCULAR | Status: DC

## 2023-05-06 MED ORDER — LORAZEPAM 1 MG PO TABS: 1.0000 mg | ORAL_TABLET | ORAL | Status: DC | PRN

## 2023-05-06 MED ORDER — ONDANSETRON HCL 4 MG/2ML IJ SOLN
4.0000 mg | Freq: Once | INTRAMUSCULAR | Status: AC
  Administered 2023-05-06: 4 mg via INTRAVENOUS
  Filled 2023-05-06: qty 2

## 2023-05-06 MED ORDER — SODIUM CHLORIDE 0.9 % IV SOLN: Freq: Once | INTRAVENOUS | Status: AC

## 2023-05-06 MED ORDER — THIAMINE MONONITRATE 100 MG PO TABS
100.0000 mg | ORAL_TABLET | Freq: Every day | ORAL | Status: DC
  Administered 2023-05-07 – 2023-05-08 (×2): 100 mg via ORAL
  Filled 2023-05-06 (×2): qty 1

## 2023-05-06 MED ORDER — ADULT MULTIVITAMIN W/MINERALS CH
1.0000 | ORAL_TABLET | Freq: Every day | ORAL | Status: DC
  Administered 2023-05-07 – 2023-05-08 (×2): 1 via ORAL
  Filled 2023-05-06 (×2): qty 1

## 2023-05-06 MED ORDER — IOHEXOL 300 MG/ML  SOLN
100.0000 mL | Freq: Once | INTRAMUSCULAR | Status: AC | PRN
Start: 2023-05-06 — End: 2023-05-06
  Administered 2023-05-06: 100 mL via INTRAVENOUS

## 2023-05-06 MED ORDER — FOLIC ACID 1 MG PO TABS
1.0000 mg | ORAL_TABLET | Freq: Every day | ORAL | Status: DC
  Administered 2023-05-07 – 2023-05-08 (×2): 1 mg via ORAL
  Filled 2023-05-06 (×2): qty 1

## 2023-05-06 MED ORDER — LORAZEPAM 2 MG/ML IJ SOLN: 1.0000 mg | INTRAMUSCULAR | Status: DC | PRN

## 2023-05-06 MED ORDER — MORPHINE SULFATE (PF) 4 MG/ML IV SOLN
4.0000 mg | Freq: Once | INTRAVENOUS | Status: AC
  Administered 2023-05-06: 4 mg via INTRAVENOUS
  Filled 2023-05-06: qty 1

## 2023-05-06 NOTE — ED Provider Notes (Signed)
[place] [facility]PARTMENT AT [facility] Provider Note   CSN: [id number] Arrival date & time: 05/06/23  1646     History  Chief Complaint  Patient presents with   Abdominal Pain    Larry Floyd is a 62 y.o. male with a history including hypertension, type 2 diabetes, GERD, hypercholesterolemia, emphysema and history of alcohol use disorder, stating he stopped drinking 1 week ago but prior to this was a fairly heavy daily drinker.  He presents secondary to epigastric abdominal pain which started yesterday, he describes sharp stabbing pain in association with nausea and multiple episodes of vomiting, nonbloody which started last night.  He has not had p.o. intake today secondary to pain.  He denies prior similar symptoms.  He denies fevers or chills, no diarrhea, last bowel movement was this morning and normal, denies flank pain or dysuria, no chest pain or increased shortness of breath.  He has had no pain medications nor is he found any alleviators prior to arrival.   The history is provided by the patient.  Abdominal Pain Associated symptoms: nausea and vomiting   Associated symptoms: no chest pain, no chills, no dysuria, no fever, no shortness of breath and no sore throat        Home Medications Prior to Admission medications   Medication Sig Start Date End Date Taking? Authorizing Provider  albuterol (VENTOLIN HFA) 108 (90 Base) MCG/ACT inhaler Inhale 2 puffs into the lungs every 6 (six) hours as needed for wheezing or shortness of breath. 03/23/23   Gabriel Earing, FNP  amLODipine (NORVASC) 2.5 MG tablet Take 1 tablet (2.5 mg total) by mouth daily. 12/20/22   Gabriel Earing, FNP  aspirin EC 81 MG tablet Take 81 mg by mouth in the morning. Swallow whole. Patient not taking: Reported on 04/25/2023    [provider]  atorvastatin (LIPITOR) 80 MG tablet Take 1 tablet (80 mg total) by mouth daily. 04/25/23   Gabriel Earing, FNP  bisoprolol (ZEBETA) 5  MG tablet Take 1 tablet by mouth once daily 01/03/23   Marykay Lex, MD  cetirizine (ZYRTEC) 10 MG tablet Take 0.5 tablets (5 mg total) by mouth daily. Patient taking differently: Take 10 mg by mouth daily. 04/13/21   Jacquelin Hawking, PA-C  dapagliflozin propanediol (FARXIGA) 10 MG TABS tablet Take 1 tablet (10 mg total) by mouth daily before breakfast. 12/20/22   Gabriel Earing, FNP  esomeprazole (NEXIUM) 40 MG capsule Take 1 capsule (40 mg total) by mouth daily at 12 noon. 04/25/23   Gabriel Earing, FNP  Fluticasone-Umeclidin-Vilant (TRELEGY ELLIPTA) 100-62.5-25 MCG/ACT AEPB Inhale 1 Inhalation into the lungs daily. 07/28/22   Gabriel Earing, FNP  gabapentin (NEURONTIN) 600 MG tablet Take 0.5 tablet by mouth in the morning then take a full tablet by mouth at bedtime. 12/20/22   Gabriel Earing, FNP  meloxicam (MOBIC) 15 MG tablet Take 1 tablet (15 mg total) by mouth daily. 12/20/22   Gabriel Earing, FNP  metFORMIN (GLUCOPHAGE) 1000 MG tablet Take 1 tablet (1,000 mg total) by mouth 2 (two) times daily with a meal. 12/20/22   Gabriel Earing, FNP  Omega-3 Fatty Acids (FISH OIL) 1000 MG CPDR Take 1,000 mg by mouth daily. And then increase to 3000 mg daily after 3 weeks 03/19/22   Marykay Lex, MD  sertraline (ZOLOFT) 100 MG tablet Take 1 tablet (100 mg total) by mouth daily. 04/25/23   Gabriel Earing, FNP  sitaGLIPtin (JANUVIA) 100 MG tablet Take 1 tablet (100 mg total) by mouth daily. 12/20/22   Gabriel Earing, FNP      Allergies    Patient has no known allergies.    Review of Systems   Review of Systems  Constitutional:  Negative for chills and fever.  HENT:  Negative for congestion and sore throat.   Eyes: Negative.   Respiratory:  Negative for chest tightness and shortness of breath.   Cardiovascular:  Negative for chest pain.  Gastrointestinal:  Positive for abdominal pain, nausea and vomiting.  Genitourinary: Negative.  Negative for dysuria and flank pain.   Musculoskeletal:  Negative for arthralgias, joint swelling and neck pain.  Skin: Negative.  Negative for rash and wound.  Neurological:  Negative for dizziness, weakness, light-headedness, numbness and headaches.  Psychiatric/Behavioral: Negative.    All other systems reviewed and are negative.   Physical Exam Updated Vital Signs BP (!) 148/81 (BP Location: Right Arm)   Pulse 74   Temp 98.3 F (36.8 C) (Oral)   Resp 18   Ht 5\' 7"  (1.702 m)   Wt 62.1 kg   SpO2 93%   BMI 21.46 kg/m  Physical Exam Vitals and nursing note reviewed.  Constitutional:      Appearance: He is well-developed.  HENT:     Head: Normocephalic and atraumatic.  Eyes:     Conjunctiva/sclera: Conjunctivae normal.  Cardiovascular:     Rate and Rhythm: Normal rate and regular rhythm.     Heart sounds: Normal heart sounds.  Pulmonary:     Effort: Pulmonary effort is normal.     Breath sounds: Normal breath sounds. No wheezing.  Abdominal:     General: Bowel sounds are normal. There is distension.     Palpations: Abdomen is soft.     Tenderness: There is abdominal tenderness in the epigastric area. Negative signs include Murphy's sign.     Comments: Soft distension with upper increased tympany  Musculoskeletal:        General: Normal range of motion.     Cervical back: Normal range of motion.  Skin:    General: Skin is warm and dry.  Neurological:     Mental Status: He is alert.     ED Results / Procedures / Treatments   Labs (all labs ordered are listed, but only abnormal results are displayed) Labs Reviewed  LIPASE, BLOOD - Abnormal; Notable for the following components:      Result Value   Lipase 78 (*)    All other components within normal limits  COMPREHENSIVE METABOLIC PANEL - Abnormal; Notable for the following components:   Sodium 121 (*)    Chloride 88 (*)    CO2 21 (*)    Glucose, Bld 165 (*)    All other components within normal limits  CBC - Abnormal; Notable for the following  components:   WBC 18.8 (*)    Platelets 507 (*)    All other components within normal limits  URINALYSIS, ROUTINE W REFLEX MICROSCOPIC - Abnormal; Notable for the following components:   Glucose, UA 150 (*)    Protein, ur 100 (*)    All other components within normal limits    EKG None  Radiology No results found.  Procedures Procedures    Medications Ordered in ED Medications  morphine (PF) 4 MG/ML injection 4 mg (has no administration in time range)  ondansetron (ZOFRAN) injection 4 mg (has no administration in time range)  iohexol (OMNIPAQUE) 300  MG/ML solution 100 mL (has no administration in time range)    ED Course/ Medical Decision Making/ A&P                                 Medical Decision Making Patient presenting with epigastric abdominal pain nausea and vomiting and intolerant of any p.o. intake x 24 hours.  Differential diagnosis including viral or bacterial gastroenteritis, PUD, acute cholecystitis, pancreatitis, small bowel obstruction.  IV started, pain and nausea medicine given, CT imaging ordered.  Given location of pain and patient's age will also order EKG.  Dr. Clayborne Dana assuming care.    Amount and/or Complexity of Data Reviewed Labs: ordered. Radiology: ordered.  Risk OTC drugs. Prescription drug management.           Final Clinical Impression(s) / ED Diagnoses Final diagnoses:  Epigastric pain    Rx / DC Orders ED Discharge Orders     None         Victoriano Lain 05/06/23 2359    Royanne Foots, DO 05/08/23 2357

## 2023-05-06 NOTE — ED Provider Notes (Signed)
8:05 AM Assumed care from [facility] and Dr. Elayne Snare, please see their note for full history, physical and decision making until this point. In brief this is a 62 y.o. year old male who presented to the ED tonight with Abdominal Pain     62 year old male alcoholic with drink about a week ago here with about a days of worsening abdominal discomfort mostly epigastric area associated with emesis.  Has history of GERD but this seems to be worse for him.  Lipase mildly elevated, hyponatremic.  Possibly [place] mania.  Will add on urine labs for sodium and replete same.  He is pending CT scan to evaluate finger ties.  On my interpretation the CT scan looks like he probably has gastritis maybe a little bit of fluid around the pancreas so we will await radiology read for that and other incidental findings.  Labs, studies and imaging reviewed by myself and considered in medical decision making if ordered. Imaging interpreted by radiology.  Labs Reviewed  LIPASE, BLOOD - Abnormal; Notable for the following components:      Result Value   Lipase 78 (*)    All other components within normal limits  COMPREHENSIVE METABOLIC PANEL - Abnormal; Notable for the following components:   Sodium 121 (*)    Chloride 88 (*)    CO2 21 (*)    Glucose, Bld 165 (*)    All other components within normal limits  CBC - Abnormal; Notable for the following components:   WBC 18.8 (*)    Platelets 507 (*)    All other components within normal limits  URINALYSIS, ROUTINE W REFLEX MICROSCOPIC - Abnormal; Notable for the following components:   Glucose, UA 150 (*)    Protein, ur 100 (*)    All other components within normal limits  URINALYSIS, ROUTINE W REFLEX MICROSCOPIC - Abnormal; Notable for the following components:   Color, Urine COLORLESS (*)    All other components within normal limits  CBC - Abnormal; Notable for the following components:   WBC 11.4 (*)    Platelets 504 (*)    All other components within normal limits   CBG MONITORING, ED - Abnormal; Notable for the following components:   Glucose-Capillary 137 (*)    All other components within normal limits  SODIUM, URINE, RANDOM  OSMOLALITY  OSMOLALITY, URINE  BASIC METABOLIC PANEL  BASIC METABOLIC PANEL  HEMOGLOBIN A1C    CT ABDOMEN PELVIS W CONTRAST  Final Result      No follow-ups on file.  Physical Exam  BP 131/80   Pulse 60   Temp 98.3 F (36.8 C) (Oral)   Resp 19   Ht 5\' 7"  (1.702 m)   Wt 62.1 kg   SpO2 97%   BMI 21.46 kg/m   Physical Exam Vitals reviewed.  Cardiovascular:     Rate and Rhythm: Normal rate.  Pulmonary:     Effort: Pulmonary effort is normal.  Abdominal:     General: There is distension.     Tenderness: There is no abdominal tenderness.     Procedures  .Critical Care  Performed by: Marily Memos, MD Authorized by: Marily Memos, MD   Critical care provider statement:    Critical care time (minutes):  30   Critical care was necessary to treat or prevent imminent or life-threatening deterioration of the following conditions:  Endocrine crisis   Critical care was time spent personally by me on the following activities:  Development of treatment plan with patient  or surrogate, discussions with consultants, evaluation of patient's response to treatment, examination of patient, ordering and review of laboratory studies, ordering and review of radiographic studies, ordering and performing treatments and interventions, pulse oximetry, re-evaluation of patient's condition and review of old charts   ED Course / MDM    Medical Decision Making Amount and/or Complexity of Data Reviewed Labs: ordered. Radiology: ordered.  Risk OTC drugs. Prescription drug management. Decision regarding hospitalization.   Ct with question of pancreatitis. Also with low sodium. Started repletion. Pain hasn't returned after the morphine from earlier. Ct w/ questionable pulmonary infection but patient without productive  cough, fever or other s/s infection. Does have leukocytosis, defer to inpatient team if they want to start antibiotics. Will admit for hyponatremia, observation for pancreatitis/pain control.        Donnis Phaneuf, Barbara Cower, MD 05/07/23 [phone]

## 2023-05-06 NOTE — ED Notes (Signed)
ED Provider at bedside. 

## 2023-05-06 NOTE — ED Triage Notes (Signed)
Abd pain above umbilical hernia Hx for GERD and hernia  Pain and vomiting started last night  No issues passing stool

## 2023-05-06 NOTE — Progress Notes (Signed)
Virtual Visit Consent   ROSTON REIMERS, you are scheduled for a virtual visit with a [facility] provider today. Just as with appointments in the office, your consent must be obtained to participate. Your consent will be active for this visit and any virtual visit you may have with one of our providers in the next 365 days. If you have a MyChart account, a copy of this consent can be sent to you electronically.  As this is a virtual visit, video technology does not allow for your provider to perform a traditional examination. This may limit your provider's ability to fully assess your condition. If your provider identifies any concerns that need to be evaluated in person or the need to arrange testing (such as labs, EKG, etc.), we will make arrangements to do so. Although advances in technology are sophisticated, we cannot ensure that it will always work on either your end or our end. If the connection with a video visit is poor, the visit may have to be switched to a telephone visit. With either a video or telephone visit, we are not always able to ensure that we have a secure connection.  By engaging in this virtual visit, you consent to the provision of healthcare and authorize for your insurance to be billed (if applicable) for the services provided during this visit. Depending on your insurance coverage, you may receive a charge related to this service.  I need to obtain your verbal consent now. Are you willing to proceed with your visit today? ABAYOMI ABLER has provided verbal consent on 05/06/2023 for a virtual visit (video or telephone). Jannifer Rodney, FNP  Date: 05/06/2023 3:47 PM  Virtual Visit via Video Note   I, Jannifer Rodney, connected with  NAJEH BEEL  ([id number], [birth year]) on 05/06/23 at  2:20 PM EST by a video-enabled telemedicine application and verified that I am speaking with the correct person using two identifiers.  Location: Patient: Virtual Visit Location Patient:  Home Provider: Virtual Visit Location Provider: Home Office   I discussed the limitations of evaluation and management by telemedicine and the availability of in person appointments. The patient expressed understanding and agreed to proceed.    History of Present Illness: KINO DINGELDEIN is a 62 y.o. who identifies as a male who was assigned male at birth, and is being seen today for abdominal pain that started yesterday after starting Nexium 40 mg. Reports pain of a 10 out 10 and has vomited 5 times since yesterday.   HPI: Abdominal Pain This is a new problem. The current episode started yesterday. The problem occurs constantly. The problem has been gradually improving. The pain is located in the epigastric region. The pain is at a severity of 10/10. The pain is mild. The quality of the pain is burning and sharp. The abdominal pain does not radiate. Associated symptoms include nausea and vomiting. Pertinent negatives include no belching, constipation, diarrhea, dysuria, fever, flatus, frequency or myalgias. He has tried proton pump inhibitors for the symptoms. The treatment provided no relief. His past medical history is significant for GERD. There is no history of Crohn's disease, gallstones, pancreatitis, PUD or ulcerative colitis.    Problems:  Patient Active Problem List   Diagnosis Date Noted   Anxiety 04/25/2023   Alcohol abuse 04/25/2023   Calcification of both carotid arteries 04/25/2023   Weight loss 04/25/2023   SVT (supraventricular tachycardia) (HCC) 04/25/2023   Pulmonary nodule 1 cm or greater in diameter 12/29/2022  Coronary artery disease involving native coronary artery of native heart with unstable angina pectoris (HCC) 06/09/2022   Positive colorectal cancer screening using Cologuard test 06/09/2022   Controlled type 2 diabetes mellitus with hyperglycemia, without long-term current use of insulin (HCC) 03/08/2022   Fatigue 12/14/2021   DOE (dyspnea on exertion) 12/14/2021    Abnormal ventricular wall motion 12/14/2021   Precordial pain 12/14/2021   Hyperlipidemia associated with type 2 diabetes mellitus (HCC) 12/14/2021   Progressive angina (HCC) 11/16/2021   Gastroesophageal reflux disease 10/14/2021   Chronic pain of left knee 10/14/2021   Type 2 diabetes mellitus with diabetic neuropathy, without long-term current use of insulin (HCC) 05/01/2018   Hypertension associated with diabetes (HCC) 03/20/2018   Chronic obstructive pulmonary disease (HCC) 03/20/2018   Tobacco use disorder 03/20/2018    Allergies: No Known Allergies Medications:  Current Outpatient Medications:    albuterol (VENTOLIN HFA) 108 (90 Base) MCG/ACT inhaler, Inhale 2 puffs into the lungs every 6 (six) hours as needed for wheezing or shortness of breath., Disp: 20.1 g, Rfl: 2   amLODipine (NORVASC) 2.5 MG tablet, Take 1 tablet (2.5 mg total) by mouth daily., Disp: 90 tablet, Rfl: 3   aspirin EC 81 MG tablet, Take 81 mg by mouth in the morning. Swallow whole. (Patient not taking: Reported on 04/25/2023), Disp: , Rfl:    atorvastatin (LIPITOR) 80 MG tablet, Take 1 tablet (80 mg total) by mouth daily., Disp: 90 tablet, Rfl: 3   bisoprolol (ZEBETA) 5 MG tablet, Take 1 tablet by mouth once daily, Disp: 30 tablet, Rfl: 4   cetirizine (ZYRTEC) 10 MG tablet, Take 0.5 tablets (5 mg total) by mouth daily. (Patient taking differently: Take 10 mg by mouth daily.), Disp: 45 tablet, Rfl: 1   dapagliflozin propanediol (FARXIGA) 10 MG TABS tablet, Take 1 tablet (10 mg total) by mouth daily before breakfast., Disp: 90 tablet, Rfl: 3   esomeprazole (NEXIUM) 40 MG capsule, Take 1 capsule (40 mg total) by mouth daily at 12 noon., Disp: 90 capsule, Rfl: 3   Fluticasone-Umeclidin-Vilant (TRELEGY ELLIPTA) 100-62.5-25 MCG/ACT AEPB, Inhale 1 Inhalation into the lungs daily., Disp: 60 each, Rfl: 1   gabapentin (NEURONTIN) 600 MG tablet, Take 0.5 tablet by mouth in the morning then take a full tablet by mouth at  bedtime., Disp: 180 tablet, Rfl: 1   meloxicam (MOBIC) 15 MG tablet, Take 1 tablet (15 mg total) by mouth daily., Disp: 30 tablet, Rfl: 5   metFORMIN (GLUCOPHAGE) 1000 MG tablet, Take 1 tablet (1,000 mg total) by mouth 2 (two) times daily with a meal., Disp: 180 tablet, Rfl: 3   Omega-3 Fatty Acids (FISH OIL) 1000 MG CPDR, Take 1,000 mg by mouth daily. And then increase to 3000 mg daily after 3 weeks, Disp: 90 capsule, Rfl: 11   sertraline (ZOLOFT) 100 MG tablet, Take 1 tablet (100 mg total) by mouth daily., Disp: 90 tablet, Rfl: 3   sitaGLIPtin (JANUVIA) 100 MG tablet, Take 1 tablet (100 mg total) by mouth daily., Disp: 90 tablet, Rfl: 3  Observations/Objective: Patient is well-developed, well-nourished in no acute distress.  Resting comfortably  at home.  Head is normocephalic, atraumatic.  No labored breathing.  Speech is clear and coherent with logical content.  Patient is alert and oriented at baseline.  Pain states his pain is a 10 out 10, can not lay flat because of pain  Assessment and Plan: 1. Epigastric pain (Primary)  2. Nausea and vomiting, unspecified vomiting type  Given pain is  a 10 out 10 and can not lay flat, pt sent to ED to be evaluated  Pt have nausea and vomiting  Discussed to stay NPO until cleared   Follow Up Instructions: I discussed the assessment and treatment plan with the patient. The patient was provided an opportunity to ask questions and all were answered. The patient agreed with the plan and demonstrated an understanding of the instructions.  A copy of instructions were sent to the patient via MyChart unless otherwise noted below.     The patient was advised to call back or seek an in-person evaluation if the symptoms worsen or if the condition fails to improve as anticipated.    Jannifer Rodney, FNP

## 2023-05-06 NOTE — Telephone Encounter (Signed)
  Chief Complaint: vomiting and abdominal pain Symptoms: vomiting and abdominal pain Frequency: constant since last night Pertinent Negatives: Patient denies fever, abnormal bowel movements Disposition: [] ED /[] Urgent Care (no appt availability in office) / [x] Appointment(In office/virtual)/ []  [facility]/ [] Home Care/ [] Refused Recommended Disposition /[] [place]/ []  Follow-up with PCP Additional Notes: Patient's wife called in to state patient took first dose of Nexium yesterday at 12 pm and was vomiting in 30 minutes, patient has had severe stomach pain and vomiting since yesterday. Denies dehydration, states he can still eat and drink fine, denies fever, denies blood or dark colored emesis. Patient feels vomit and stomach pain is directly related to newly prescribed Nexium. Requested to see doctor today but no availability, scheduled a video call with Doc of Day at office.    Copied from CRM [id number]. Topic: Clinical - Red Word Triage >> May 06, 2023  2:53 PM Gildardo Pounds wrote: Red Word that prompted transfer to Nurse Triage: Tyna Jaksch is making patient's stomach hurt really bad, and throwing up when started taking yesterday, cramps as well [id number] Reason for Disposition  [1] MILD-MODERATE pain AND [2] constant AND [3] present > 2 hours  Answer Assessment - Initial Assessment Questions 1. LOCATION: "Where does it hurt?"      Internal upset stomach like pain 2. RADIATION: "Does the pain shoot anywhere else?" (e.g., chest, back)     No 3. ONSET: "When did the pain begin?" (Minutes, hours or days ago)      Yesterday at 12 pm when he took the Nexium, vomiting and stomach pain started 30 min after 4. SUDDEN: "Gradual or sudden onset?"     Sudden onset after taking medication 6. SEVERITY: "How bad is the pain?"  (e.g., Scale 1-10; mild, moderate, or severe)    - MILD (1-3): Doesn't interfere with normal activities, abdomen soft and not tender to touch.     -  MODERATE (4-7): Interferes with normal activities or awakens from sleep, abdomen tender to touch.     - SEVERE (8-10): Excruciating pain, doubled over, unable to do any normal activities.       Patient states a 10 8. CAUSE: "What do you think is causing the stomach pain?"     Nexium medication 9. RELIEVING/AGGRAVATING FACTORS: "What makes it better or worse?" (e.g., antacids, bending or twisting motion, bowel movement)     Nothing is helping, but has not tried any medication to treat this 10. OTHER SYMPTOMS: "Do you have any other symptoms?" (e.g., back pain, diarrhea, fever, urination pain, vomiting)       Vomiting all night last night  Protocols used: Abdominal Pain - Male-A-AH

## 2023-05-06 NOTE — Patient Instructions (Signed)
Abdominal Pain, Adult  Pain in the abdomen (abdominal pain) can be caused by many things. In most cases, it gets better with no treatment or by being treated at home. But in some cases, it can be serious. Your health care provider will ask questions about your medical history and do a physical exam to try to figure out what is causing your pain. Follow these instructions at home: Medicines Take over-the-counter and prescription medicines only as told by your provider. Do not take medicines that help you poop (laxatives) unless told by your provider. General instructions Watch your condition for any changes. Drink enough fluid to keep your pee (urine) pale yellow. Contact a health care provider if: Your pain changes, gets worse, or lasts longer than expected. You have severe cramping or bloating in your abdomen, or you vomit. Your pain gets worse with meals, after eating, or with certain foods. You are constipated or have diarrhea for more than 2-3 days. You are not hungry, or you lose weight without trying. You have signs of dehydration. These may include: Dark pee, very little pee, or no pee. Cracked lips or dry mouth. Sleepiness or weakness. You have pain when you pee (urinate) or poop. Your abdominal pain wakes you up at night. You have blood in your pee. You have a fever. Get help right away if: You cannot stop vomiting. Your pain is only in one part of the abdomen. Pain on the right side could be caused by appendicitis. You have bloody or black poop (stool), or poop that looks like tar. You have trouble breathing. You have chest pain. These symptoms may be an emergency. Get help right away. Call 911. Do not wait to see if the symptoms will go away. Do not drive yourself to the hospital. This information is not intended to replace advice given to you by your health care provider. Make sure you discuss any questions you have with your health care provider. Document Revised:  02/17/2022 Document Reviewed: 02/17/2022 Elsevier Patient Education  2024 [employer] Inc.  

## 2023-05-07 DIAGNOSIS — M1990 Unspecified osteoarthritis, unspecified site: Secondary | ICD-10-CM | POA: Diagnosis present

## 2023-05-07 DIAGNOSIS — F17210 Nicotine dependence, cigarettes, uncomplicated: Secondary | ICD-10-CM | POA: Diagnosis present

## 2023-05-07 DIAGNOSIS — E871 Hypo-osmolality and hyponatremia: Secondary | ICD-10-CM | POA: Diagnosis present

## 2023-05-07 DIAGNOSIS — E1165 Type 2 diabetes mellitus with hyperglycemia: Secondary | ICD-10-CM | POA: Diagnosis present

## 2023-05-07 DIAGNOSIS — Z825 Family history of asthma and other chronic lower respiratory diseases: Secondary | ICD-10-CM | POA: Diagnosis not present

## 2023-05-07 DIAGNOSIS — D72829 Elevated white blood cell count, unspecified: Secondary | ICD-10-CM | POA: Insufficient documentation

## 2023-05-07 DIAGNOSIS — K861 Other chronic pancreatitis: Secondary | ICD-10-CM | POA: Diagnosis present

## 2023-05-07 DIAGNOSIS — F17200 Nicotine dependence, unspecified, uncomplicated: Secondary | ICD-10-CM

## 2023-05-07 DIAGNOSIS — J189 Pneumonia, unspecified organism: Secondary | ICD-10-CM | POA: Diagnosis present

## 2023-05-07 DIAGNOSIS — I10 Essential (primary) hypertension: Secondary | ICD-10-CM | POA: Diagnosis present

## 2023-05-07 DIAGNOSIS — Z791 Long term (current) use of non-steroidal anti-inflammatories (NSAID): Secondary | ICD-10-CM | POA: Diagnosis not present

## 2023-05-07 DIAGNOSIS — E039 Hypothyroidism, unspecified: Secondary | ICD-10-CM | POA: Diagnosis present

## 2023-05-07 DIAGNOSIS — R748 Abnormal levels of other serum enzymes: Secondary | ICD-10-CM | POA: Insufficient documentation

## 2023-05-07 DIAGNOSIS — Z808 Family history of malignant neoplasm of other organs or systems: Secondary | ICD-10-CM | POA: Diagnosis not present

## 2023-05-07 DIAGNOSIS — Z833 Family history of diabetes mellitus: Secondary | ICD-10-CM | POA: Diagnosis not present

## 2023-05-07 DIAGNOSIS — K852 Alcohol induced acute pancreatitis without necrosis or infection: Secondary | ICD-10-CM | POA: Diagnosis present

## 2023-05-07 DIAGNOSIS — R109 Unspecified abdominal pain: Secondary | ICD-10-CM | POA: Diagnosis not present

## 2023-05-07 DIAGNOSIS — D75839 Thrombocytosis, unspecified: Secondary | ICD-10-CM | POA: Diagnosis present

## 2023-05-07 DIAGNOSIS — E861 Hypovolemia: Secondary | ICD-10-CM | POA: Diagnosis present

## 2023-05-07 DIAGNOSIS — J690 Pneumonitis due to inhalation of food and vomit: Secondary | ICD-10-CM | POA: Diagnosis present

## 2023-05-07 DIAGNOSIS — J449 Chronic obstructive pulmonary disease, unspecified: Secondary | ICD-10-CM

## 2023-05-07 DIAGNOSIS — Z79899 Other long term (current) drug therapy: Secondary | ICD-10-CM | POA: Diagnosis not present

## 2023-05-07 DIAGNOSIS — J439 Emphysema, unspecified: Secondary | ICD-10-CM | POA: Diagnosis present

## 2023-05-07 DIAGNOSIS — Z823 Family history of stroke: Secondary | ICD-10-CM | POA: Diagnosis not present

## 2023-05-07 DIAGNOSIS — E782 Mixed hyperlipidemia: Secondary | ICD-10-CM

## 2023-05-07 DIAGNOSIS — K219 Gastro-esophageal reflux disease without esophagitis: Secondary | ICD-10-CM

## 2023-05-07 DIAGNOSIS — F1020 Alcohol dependence, uncomplicated: Secondary | ICD-10-CM | POA: Diagnosis present

## 2023-05-07 DIAGNOSIS — J432 Centrilobular emphysema: Secondary | ICD-10-CM | POA: Diagnosis not present

## 2023-05-07 DIAGNOSIS — Z7984 Long term (current) use of oral hypoglycemic drugs: Secondary | ICD-10-CM | POA: Diagnosis not present

## 2023-05-07 DIAGNOSIS — F1090 Alcohol use, unspecified, uncomplicated: Secondary | ICD-10-CM | POA: Insufficient documentation

## 2023-05-07 DIAGNOSIS — Z8719 Personal history of other diseases of the digestive system: Secondary | ICD-10-CM

## 2023-05-07 DIAGNOSIS — Z8249 Family history of ischemic heart disease and other diseases of the circulatory system: Secondary | ICD-10-CM | POA: Diagnosis not present

## 2023-05-07 LAB — HEMOGLOBIN A1C
Hgb A1c MFr Bld: 7.1 % — ABNORMAL HIGH (ref 4.8–5.6)
Mean Plasma Glucose: 157.07 mg/dL

## 2023-05-07 LAB — GLUCOSE, CAPILLARY
Glucose-Capillary: 90 mg/dL (ref 70–99)
Glucose-Capillary: 190 mg/dL — ABNORMAL HIGH (ref 70–99)

## 2023-05-07 LAB — CBC
HCT: 40 % (ref 39.0–52.0)
Hemoglobin: 13.7 g/dL (ref 13.0–17.0)
MCH: 29 pg (ref 26.0–34.0)
MCHC: 34.3 g/dL (ref 30.0–36.0)
MCV: 84.6 fL (ref 80.0–100.0)
Platelets: 504 10*3/uL — ABNORMAL HIGH (ref 150–400)
RBC: 4.73 MIL/uL (ref 4.22–5.81)
RDW: 15.3 % (ref 11.5–15.5)
WBC: 11.4 10*3/uL — ABNORMAL HIGH (ref 4.0–10.5)
nRBC: 0 % (ref 0.0–0.2)

## 2023-05-07 LAB — OSMOLALITY, URINE: Osmolality, Ur: 185 mosm/kg — ABNORMAL LOW (ref 300–900)

## 2023-05-07 LAB — URINALYSIS, ROUTINE W REFLEX MICROSCOPIC
Bilirubin Urine: NEGATIVE
Glucose, UA: NEGATIVE mg/dL
Hgb urine dipstick: NEGATIVE
Ketones, ur: NEGATIVE mg/dL
Leukocytes,Ua: NEGATIVE
Nitrite: NEGATIVE
Protein, ur: NEGATIVE mg/dL
Specific Gravity, Urine: 1.026 (ref 1.005–1.030)
pH: 6 (ref 5.0–8.0)

## 2023-05-07 LAB — OSMOLALITY: Osmolality: 283 mosm/kg (ref 275–295)

## 2023-05-07 LAB — BASIC METABOLIC PANEL
Anion gap: 8 (ref 5–15)
BUN: 9 mg/dL (ref 8–23)
CO2: 22 mmol/L (ref 22–32)
Calcium: 8 mg/dL — ABNORMAL LOW (ref 8.9–10.3)
Chloride: 101 mmol/L (ref 98–111)
Creatinine, Ser: 0.57 mg/dL — ABNORMAL LOW (ref 0.61–1.24)
GFR, Estimated: >60 mL/min (ref >60)
Glucose, Bld: 105 mg/dL — ABNORMAL HIGH (ref 70–99)
Potassium: 4.3 mmol/L (ref 3.5–5.1)
Sodium: 131 mmol/L — ABNORMAL LOW (ref 135–145)

## 2023-05-07 LAB — CBG MONITORING, ED
Glucose-Capillary: 137 mg/dL — ABNORMAL HIGH (ref 70–99)
Glucose-Capillary: 163 mg/dL — ABNORMAL HIGH (ref 70–99)

## 2023-05-07 LAB — SODIUM, URINE, RANDOM: Sodium, Ur: 18 mmol/L

## 2023-05-07 LAB — CORTISOL: Cortisol, Plasma: 9.6 ug/dL

## 2023-05-07 LAB — STREP PNEUMONIAE URINARY ANTIGEN: Strep Pneumo Urinary Antigen: NEGATIVE

## 2023-05-07 LAB — TSH: TSH: 2.256 u[IU]/mL (ref 0.350–4.500)

## 2023-05-07 MED ORDER — UMECLIDINIUM BROMIDE 62.5 MCG/ACT IN AEPB
1.0000 | INHALATION_SPRAY | Freq: Every day | RESPIRATORY_TRACT | Status: DC
  Administered 2023-05-07 – 2023-05-08 (×2): 1 via RESPIRATORY_TRACT
  Filled 2023-05-07: qty 7

## 2023-05-07 MED ORDER — ONDANSETRON HCL 4 MG PO TABS: 4.0000 mg | ORAL_TABLET | Freq: Four times a day (QID) | ORAL | Status: DC | PRN

## 2023-05-07 MED ORDER — SODIUM CHLORIDE 0.9 % IV SOLN
3.0000 g | Freq: Four times a day (QID) | INTRAVENOUS | Status: DC
  Administered 2023-05-07 – 2023-05-08 (×5): 3 g via INTRAVENOUS
  Filled 2023-05-07 (×5): qty 8

## 2023-05-07 MED ORDER — LORATADINE 10 MG PO TABS
10.0000 mg | ORAL_TABLET | Freq: Every day | ORAL | Status: DC
  Administered 2023-05-07 – 2023-05-08 (×2): 10 mg via ORAL
  Filled 2023-05-07 (×2): qty 1

## 2023-05-07 MED ORDER — ALBUTEROL SULFATE (2.5 MG/3ML) 0.083% IN NEBU: 3.0000 mL | INHALATION_SOLUTION | Freq: Four times a day (QID) | RESPIRATORY_TRACT | Status: DC | PRN

## 2023-05-07 MED ORDER — AMLODIPINE BESYLATE 5 MG PO TABS
2.5000 mg | ORAL_TABLET | Freq: Every day | ORAL | Status: DC
  Administered 2023-05-07 – 2023-05-08 (×2): 2.5 mg via ORAL
  Filled 2023-05-07 (×2): qty 1

## 2023-05-07 MED ORDER — PANTOPRAZOLE SODIUM 40 MG PO TBEC
80.0000 mg | DELAYED_RELEASE_TABLET | Freq: Every day | ORAL | Status: DC
  Administered 2023-05-07 – 2023-05-08 (×2): 80 mg via ORAL
  Filled 2023-05-07 (×2): qty 2

## 2023-05-07 MED ORDER — LACTATED RINGERS IV SOLN: INTRAVENOUS | Status: AC

## 2023-05-07 MED ORDER — SERTRALINE HCL 50 MG PO TABS
100.0000 mg | ORAL_TABLET | Freq: Every day | ORAL | Status: DC
  Administered 2023-05-07 – 2023-05-08 (×2): 100 mg via ORAL
  Filled 2023-05-07 (×2): qty 2

## 2023-05-07 MED ORDER — INSULIN ASPART 100 UNIT/ML IJ SOLN
0.0000 [IU] | Freq: Three times a day (TID) | INTRAMUSCULAR | Status: DC
  Administered 2023-05-07: 2 [IU] via SUBCUTANEOUS
  Administered 2023-05-07 – 2023-05-08 (×2): 1 [IU] via SUBCUTANEOUS
  Administered 2023-05-08 (×2): 2 [IU] via SUBCUTANEOUS
  Filled 2023-05-07 (×2): qty 1

## 2023-05-07 MED ORDER — ATORVASTATIN CALCIUM 40 MG PO TABS
80.0000 mg | ORAL_TABLET | Freq: Every day | ORAL | Status: DC
  Administered 2023-05-07 – 2023-05-08 (×2): 80 mg via ORAL
  Filled 2023-05-07 (×2): qty 2

## 2023-05-07 MED ORDER — FLUTICASONE FUROATE-VILANTEROL 100-25 MCG/ACT IN AEPB
1.0000 | INHALATION_SPRAY | Freq: Every day | RESPIRATORY_TRACT | Status: DC
  Administered 2023-05-07 – 2023-05-08 (×2): 1 via RESPIRATORY_TRACT
  Filled 2023-05-07: qty 28

## 2023-05-07 MED ORDER — ALBUTEROL SULFATE (2.5 MG/3ML) 0.083% IN NEBU
2.5000 mg | INHALATION_SOLUTION | Freq: Three times a day (TID) | RESPIRATORY_TRACT | Status: DC
Start: 2023-05-07 — End: 2023-05-08
  Administered 2023-05-07 – 2023-05-08 (×3): 2.5 mg via RESPIRATORY_TRACT
  Filled 2023-05-07 (×3): qty 3

## 2023-05-07 MED ORDER — PANTOPRAZOLE SODIUM 40 MG PO TBEC: 40.0000 mg | DELAYED_RELEASE_TABLET | Freq: Every day | ORAL | Status: DC

## 2023-05-07 MED ORDER — HYDROCODONE BIT-HOMATROP MBR 5-1.5 MG/5ML PO SOLN: 5.0000 mL | Freq: Four times a day (QID) | ORAL | Status: DC | PRN

## 2023-05-07 MED ORDER — ONDANSETRON HCL 4 MG/2ML IJ SOLN: 4.0000 mg | Freq: Four times a day (QID) | INTRAMUSCULAR | Status: DC | PRN

## 2023-05-07 MED ORDER — ENOXAPARIN SODIUM 40 MG/0.4ML IJ SOSY
40.0000 mg | PREFILLED_SYRINGE | INTRAMUSCULAR | Status: DC
  Administered 2023-05-07 – 2023-05-08 (×2): 40 mg via SUBCUTANEOUS
  Filled 2023-05-07 (×2): qty 0.4

## 2023-05-07 MED ORDER — ACETAMINOPHEN 650 MG RE SUPP: 650.0000 mg | Freq: Four times a day (QID) | RECTAL | Status: DC | PRN

## 2023-05-07 MED ORDER — PANTOPRAZOLE SODIUM 40 MG PO TBEC
80.0000 mg | DELAYED_RELEASE_TABLET | Freq: Every day | ORAL | Status: DC
  Administered 2023-05-07: 80 mg via ORAL
  Filled 2023-05-07: qty 2

## 2023-05-07 MED ORDER — GUAIFENESIN ER 600 MG PO TB12
1200.0000 mg | ORAL_TABLET | Freq: Two times a day (BID) | ORAL | Status: DC
  Administered 2023-05-07 – 2023-05-08 (×3): 1200 mg via ORAL
  Filled 2023-05-07 (×3): qty 2

## 2023-05-07 MED ORDER — SODIUM CHLORIDE 0.9 % IV BOLUS
2000.0000 mL | Freq: Once | INTRAVENOUS | Status: AC
  Administered 2023-05-07: 2000 mL via INTRAVENOUS

## 2023-05-07 MED ORDER — DAPAGLIFLOZIN PROPANEDIOL 10 MG PO TABS
10.0000 mg | ORAL_TABLET | Freq: Every day | ORAL | Status: DC
  Filled 2023-05-07 (×2): qty 1

## 2023-05-07 MED ORDER — MORPHINE SULFATE (PF) 2 MG/ML IV SOLN
2.0000 mg | INTRAVENOUS | Status: DC | PRN
  Administered 2023-05-07: 2 mg via INTRAVENOUS
  Filled 2023-05-07: qty 1

## 2023-05-07 MED ORDER — SODIUM CHLORIDE 0.9 % IV SOLN
500.0000 mg | INTRAVENOUS | Status: DC
  Administered 2023-05-07 – 2023-05-08 (×2): 500 mg via INTRAVENOUS
  Filled 2023-05-07 (×3): qty 5

## 2023-05-07 MED ORDER — NICOTINE 21 MG/24HR TD PT24
21.0000 mg | MEDICATED_PATCH | Freq: Every day | TRANSDERMAL | Status: DC
Start: 2023-05-07 — End: 2023-05-08
  Administered 2023-05-07 – 2023-05-08 (×2): 21 mg via TRANSDERMAL
  Filled 2023-05-07 (×2): qty 1

## 2023-05-07 MED ORDER — SODIUM CHLORIDE 0.9 % IV SOLN
2.0000 g | INTRAVENOUS | Status: DC
  Administered 2023-05-07: 2 g via INTRAVENOUS
  Filled 2023-05-07: qty 20

## 2023-05-07 MED ORDER — ACETAMINOPHEN 325 MG PO TABS: 650.0000 mg | ORAL_TABLET | Freq: Four times a day (QID) | ORAL | Status: DC | PRN

## 2023-05-07 MED ORDER — GABAPENTIN 300 MG PO CAPS
300.0000 mg | ORAL_CAPSULE | Freq: Two times a day (BID) | ORAL | Status: DC
  Administered 2023-05-07 – 2023-05-08 (×3): 300 mg via ORAL
  Filled 2023-05-07 (×3): qty 1

## 2023-05-07 NOTE — ED Notes (Addendum)
Patient ambulated to use restroom and lungs "feeling better".

## 2023-05-07 NOTE — ED Notes (Addendum)
ED TO INPATIENT HANDOFF REPORT  ED Nurse Name and Phone #: Beverley Fiedler Name/Age/Gender Larry Floyd 62 y.o. male Room/Bed: APA08/APA08  Code Status   Code Status: Full Code  Home/SNF/Other Home Patient oriented to: self, place, time, and situation Is this baseline? Yes   Triage Complete: Triage complete  Chief Complaint Hyponatremia [E87.1]  Triage Note Abd pain above umbilical hernia Hx for GERD and hernia  Pain and vomiting started last night  No issues passing stool    Allergies No Known Allergies  Level of Care/Admitting Diagnosis ED Disposition     ED Disposition  Admit   Condition  --   Comment  Hospital Area: [facility] [[id number]]  Level of Care: Telemetry [5]  Covid Evaluation: Asymptomatic - no recent exposure (last 10 days) testing not required  Diagnosis: Hyponatremia [119147]  Admitting Physician: Rodolph Bong [[id number]]  Attending Physician: Rodolph Bong [[id number]]  Certification:: I certify this patient will need inpatient services for at least 2 midnights          B Medical/Surgery History Past Medical History:  Diagnosis Date   Arthritis    Bronchitis    Cataract    Diabetes mellitus without complication (HCC)    Emphysema lung (HCC)    GERD (gastroesophageal reflux disease)    Hypercholesteremia    Hypertension    Past Surgical History:  Procedure Laterality Date   BACK SURGERY     Lumbar   CORONARY PRESSURE/FFR STUDY N/A 11/25/2021   Procedure: INTRAVASCULAR PRESSURE WIRE/FFR STUDY;  Surgeon: Marykay Lex, MD;  Location: [facility] INVASIVE CV LAB;  Service: Cardiovascular;  Laterality: N/A;   EYE SURGERY Bilateral    implant insertion   HERNIA REPAIR     x2   LEFT HEART CATH AND CORONARY ANGIOGRAPHY N/A 11/25/2021   Procedure: LEFT HEART CATH AND CORONARY ANGIOGRAPHY;  Surgeon: Marykay Lex, MD;  Location: [facility] INVASIVE CV LAB::   Angiographically Mod 1 V CAD w/ Dist LCx 60% - RFR NEGATIVE lesion.   Prox  RCA 20%.  Mid RCA 10%.  Prox LAD to Mid LAD 15% . Normal LV function-EF~50 to 55%, with mildly elevated LVEDP. LV- gram suggest possible anterior wall motion abnormality.  REC: TTE to better assess.     A IV Location/Drains/Wounds Patient Lines/Drains/Airways Status     Active Line/Drains/Airways     Name Placement date Placement time Site Days   Peripheral IV 05/06/23 20 G 1" Anterior;Right Forearm 05/06/23  2319  Forearm  1   Peripheral IV 05/07/23 20 G 1" Anterior;Left Forearm 05/07/23  0910  Forearm  less than 1            Intake/Output Last 24 hours  Intake/Output Summary (Last 24 hours) at 05/07/2023 1530 Last data filed at 05/07/2023 1250 Gross per 24 hour  Intake 1656.21 ml  Output 550 ml  Net 1106.21 ml    Labs/Imaging Results for orders placed or performed during the hospital encounter of 05/06/23 (from the past 48 hours)  Urinalysis, Routine w reflex microscopic -Urine, Clean Catch     Status: Abnormal   Collection Time: 05/06/23  5:05 PM  Result Value Ref Range   Color, Urine YELLOW YELLOW   APPearance CLEAR CLEAR   Specific Gravity, Urine 1.021 1.005 - 1.030   pH 5.0 5.0 - 8.0   Glucose, UA 150 (A) NEGATIVE mg/dL   Hgb urine dipstick NEGATIVE NEGATIVE   Bilirubin Urine NEGATIVE NEGATIVE   Ketones, ur  NEGATIVE NEGATIVE mg/dL   Protein, ur 213 (A) NEGATIVE mg/dL   Nitrite NEGATIVE NEGATIVE   Leukocytes,Ua NEGATIVE NEGATIVE   RBC / HPF 0-5 0 - 5 RBC/hpf   WBC, UA 0-5 0 - 5 WBC/hpf   Bacteria, UA NONE SEEN NONE SEEN   Squamous Epithelial / HPF 0-5 0 - 5 /HPF   Mucus PRESENT     Comment: Performed at [facility], [address], [place], [place] [address]  Lipase, blood     Status: Abnormal   Collection Time: 05/06/23  5:24 PM  Result Value Ref Range   Lipase 78 (H) 11 - 51 U/L    Comment: Performed at [facility], [address], [place], [place] [address]  Comprehensive metabolic panel     Status: Abnormal   Collection Time: 05/06/23  5:24 PM   Result Value Ref Range   Sodium 121 (L) 135 - 145 mmol/L   Potassium 4.3 3.5 - 5.1 mmol/L   Chloride 88 (L) 98 - 111 mmol/L   CO2 21 (L) 22 - 32 mmol/L   Glucose, Bld 165 (H) 70 - 99 mg/dL    Comment: Glucose reference range applies only to samples taken after fasting for at least 8 hours.   BUN 14 8 - 23 mg/dL   Creatinine, Ser 2.95 0.61 - 1.24 mg/dL   Calcium 9.2 8.9 - 28.4 mg/dL   Total Protein 8.0 6.5 - 8.1 g/dL   Albumin 3.9 3.5 - 5.0 g/dL   AST 17 15 - 41 U/L   ALT 22 0 - 44 U/L   Alkaline Phosphatase 120 38 - 126 U/L   Total Bilirubin <0.2 <1.2 mg/dL   GFR, Estimated >13 >24 mL/min    Comment: (NOTE) Calculated using the CKD-EPI Creatinine Equation (2021)    Anion gap 12 5 - 15    Comment: Performed at [facility] [address], [place], [place] [address]  CBC     Status: Abnormal   Collection Time: 05/06/23  5:24 PM  Result Value Ref Range   WBC 18.8 (H) 4.0 - 10.5 K/uL   RBC 4.80 4.22 - 5.81 MIL/uL   Hemoglobin 13.7 13.0 - 17.0 g/dL   HCT 72.5 36.6 - 44.0 %   MCV 84.6 80.0 - 100.0 fL   MCH 28.5 26.0 - 34.0 pg   MCHC 33.7 30.0 - 36.0 g/dL   RDW 34.7 42.5 - 95.6 %   Platelets 507 (H) 150 - 400 K/uL   nRBC 0.0 0.0 - 0.2 %    Comment: Performed at [facility], [address] [place], [place] [address]  Urinalysis, Routine w reflex microscopic -Urine, Clean Catch     Status: Abnormal   Collection Time: 05/06/23 11:51 PM  Result Value Ref Range   Color, Urine COLORLESS (A) YELLOW   APPearance CLEAR CLEAR   Specific Gravity, Urine 1.026 1.005 - 1.030   pH 6.0 5.0 - 8.0   Glucose, UA NEGATIVE NEGATIVE mg/dL   Hgb urine dipstick NEGATIVE NEGATIVE   Bilirubin Urine NEGATIVE NEGATIVE   Ketones, ur NEGATIVE NEGATIVE mg/dL   Protein, ur NEGATIVE NEGATIVE mg/dL   Nitrite NEGATIVE NEGATIVE   Leukocytes,Ua NEGATIVE NEGATIVE    Comment: Performed at [facility], [address], [name], [place] [address]  Sodium, urine, random     Status: None   Collection Time:  05/06/23 11:51 PM  Result Value Ref Range   Sodium, Ur 18 mmol/L    Comment: Performed at [facility],  [address] [place], [place] [address]  CBC     Status: Abnormal   Collection Time: 05/07/23  7:23 AM  Result Value Ref Range   WBC 11.4 (H) 4.0 - 10.5 K/uL   RBC 4.73 4.22 - 5.81 MIL/uL   Hemoglobin 13.7 13.0 - 17.0 g/dL   HCT 93.7 16.9 - 67.8 %   MCV 84.6 80.0 - 100.0 fL   MCH 29.0 26.0 - 34.0 pg   MCHC 34.3 30.0 - 36.0 g/dL   RDW 93.8 10.1 - 75.1 %   Platelets 504 (H) 150 - 400 K/uL   nRBC 0.0 0.0 - 0.2 %    Comment: Performed at [facility], [address], [place], [place] [address]  CBG monitoring, ED     Status: Abnormal   Collection Time: 05/07/23  7:41 AM  Result Value Ref Range   Glucose-Capillary 137 (H) 70 - 99 mg/dL    Comment: Glucose reference range applies only to samples taken after fasting for at least 8 hours.  Basic metabolic panel     Status: Abnormal   Collection Time: 05/07/23 11:27 AM  Result Value Ref Range   Sodium 131 (L) 135 - 145 mmol/L    Comment: DELTA CHECK NOTED   Potassium 4.3 3.5 - 5.1 mmol/L   Chloride 101 98 - 111 mmol/L   CO2 22 22 - 32 mmol/L   Glucose, Bld 105 (H) 70 - 99 mg/dL    Comment: Glucose reference range applies only to samples taken after fasting for at least 8 hours.   BUN 9 8 - 23 mg/dL   Creatinine, Ser 2.77 (L) 0.61 - 1.24 mg/dL   Calcium 8.0 (L) 8.9 - 10.3 mg/dL   GFR, Estimated >82 >42 mL/min    Comment: (NOTE) Calculated using the CKD-EPI Creatinine Equation (2021)    Anion gap 8 5 - 15    Comment: Performed at [facility], [address], [place], [place] [address]  TSH     Status: None   Collection Time: 05/07/23 11:27 AM  Result Value Ref Range   TSH 2.256 0.350 - 4.500 uIU/mL    Comment: Performed by a 3rd Generation assay with a functional sensitivity of <=0.01 uIU/mL. Performed at [facility], [address], [place], [place] [address]   CBG monitoring, ED     Status: Abnormal   Collection Time:  05/07/23 12:50 PM  Result Value Ref Range   Glucose-Capillary 163 (H) 70 - 99 mg/dL    Comment: Glucose reference range applies only to samples taken after fasting for at least 8 hours.   CT ABDOMEN PELVIS W CONTRAST Result Date: 05/07/2023 CLINICAL DATA:  Epigastric pain. Pain above umbilical hernia. History of gastroesophageal reflux disease. Pain and vomiting started last night. EXAM: CT ABDOMEN AND PELVIS WITH CONTRAST TECHNIQUE: Multidetector CT imaging of the abdomen and pelvis was performed using the standard protocol following bolus administration of intravenous contrast. RADIATION DOSE REDUCTION: This exam was performed according to the departmental dose-optimization program which includes automated exposure control, adjustment of the mA and/or kV according to patient size and/or use of iterative reconstruction technique. CONTRAST:  OMNIPAQUE IOHEXOL 300 MG/ML  SOLN COMPARISON:  07/18/2014 FINDINGS: Lower chest: Focal area of consolidation in the right lung base could represent focal pneumonia. Hepatobiliary: Mild diffuse fatty infiltration of the liver. Gallbladder and bile ducts are unremarkable. Pancreas: There is infiltration around the head of the pancreas and in the pancreaticoduodenal groove. This suggests groove pancreatitis. No loculated collections.  No evidence of pancreatic necrosis or bile duct dilatation. Spleen: Normal in size without focal abnormality. Adrenals/Urinary Tract: Adrenal glands are unremarkable. Kidneys are normal, without renal calculi, focal lesion, or hydronephrosis. Bladder wall is diffusely thickened suggesting possible cystitis. Correlate with urinalysis. Stomach/Bowel: Stomach is within normal limits. Appendix appears normal. No evidence of bowel wall thickening, distention, or inflammatory changes. Vascular/Lymphatic: Aortic atherosclerosis. No enlarged abdominal or pelvic lymph nodes. Reproductive: Prostate gland is enlarged, measuring 5.1 cm diameter.  Other: No abdominal wall hernia or abnormality. No abdominopelvic ascites. Musculoskeletal: Degenerative changes in the spine. No acute bony abnormalities. IMPRESSION: 1. 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 No pancreatic necrosis or loculated collections. 2. Focal infiltration in the right lung base may represent pneumonia. 3. Mild diffuse bladder wall thickening may indicate cystitis. Correlate with urinalysis. 4. Aortic atherosclerosis. 5. Prostate enlargement. 6. Fatty infiltration of the liver. Electronically Signed   By: Burman Nieves M.D.   On: 05/07/2023 00:02    Pending Labs Unresulted Labs (From admission, onward)     Start     Ordered   05/08/23 0500  Comprehensive metabolic panel  Tomorrow morning,   R        05/07/23 0602   05/08/23 0500  CBC  Tomorrow morning,   R        05/07/23 0602   05/08/23 0500  Magnesium  Tomorrow morning,   R        05/07/23 0602   05/08/23 0500  Phosphorus  Tomorrow morning,   R        05/07/23 0602   05/08/23 0500  Lipase, blood  Tomorrow morning,   R        05/07/23 0604   05/08/23 0500  Lipid panel  Tomorrow morning,   R        05/07/23 1523   05/07/23 1200  Basic metabolic panel  Now then every 6 hours,   R (with TIMED occurrences)      05/07/23 0555   05/07/23 1200  Cortisol  Once,   R        05/07/23 0823   05/07/23 0828  HIV Antibody (routine testing w rflx)  (HIV Antibody (Routine testing w reflex) panel)  Once,   R        05/07/23 0828   05/07/23 0828  Legionella Pneumophila Serogp 1 Ur Ag  Once,   R        05/07/23 0828   05/07/23 0828  Strep pneumoniae urinary antigen  Once,   R        05/07/23 0828   05/07/23 0630  Osmolality  Tomorrow morning,   R        05/07/23 0552   05/07/23 0610  Hemoglobin A1c  Once,   R       Comments: To assess prior glycemic control    05/07/23 0609   05/07/23 0553  Osmolality, urine  Once,   R        05/07/23 0552            Vitals/Pain Today's Vitals    05/07/23 1046 05/07/23 1100 05/07/23 1300 05/07/23 1330  BP:  134/79 127/72 137/72  Pulse:   71 82  Resp:  (!) 22 19 16   Temp:  97.7 F (36.5 C)    TempSrc:  Oral    SpO2: 97%  95% 95%  Weight:      Height:      PainSc:  Isolation Precautions No active isolations  Medications Medications  LORazepam (ATIVAN) tablet 1-4 mg ( Oral Not Given 05/07/23 0246)    Or  LORazepam (ATIVAN) injection 1-4 mg ( Intravenous See Alternative 05/07/23 0246)  thiamine (VITAMIN B1) tablet 100 mg (100 mg Oral Given 05/07/23 1002)    Or  thiamine (VITAMIN B1) injection 100 mg ( Intravenous See Alternative 05/07/23 1002)  folic acid (FOLVITE) tablet 1 mg (1 mg Oral Given 05/07/23 0934)  multivitamin with minerals tablet 1 tablet (1 tablet Oral Given 05/07/23 0933)  morphine (PF) 2 MG/ML injection 2 mg (has no administration in time range)  enoxaparin (LOVENOX) injection 40 mg (40 mg Subcutaneous Given 05/07/23 0932)  acetaminophen (TYLENOL) tablet 650 mg (has no administration in time range)    Or  acetaminophen (TYLENOL) suppository 650 mg (has no administration in time range)  ondansetron (ZOFRAN) tablet 4 mg (has no administration in time range)    Or  ondansetron (ZOFRAN) injection 4 mg (has no administration in time range)  amLODipine (NORVASC) tablet 2.5 mg (2.5 mg Oral Given 05/07/23 1137)  atorvastatin (LIPITOR) tablet 80 mg (80 mg Oral Given 05/07/23 1137)  pantoprazole (PROTONIX) EC tablet 80 mg (80 mg Oral Given 05/07/23 1002)  albuterol (PROVENTIL) (2.5 MG/3ML) 0.083% nebulizer solution 3 mL (has no administration in time range)  fluticasone furoate-vilanterol (BREO ELLIPTA) 100-25 MCG/ACT 1 puff (1 puff Inhalation Given 05/07/23 1046)    And  umeclidinium bromide (INCRUSE ELLIPTA) 62.5 MCG/ACT 1 puff (1 puff Inhalation Given 05/07/23 1046)  insulin aspart (novoLOG) injection 0-9 Units (2 Units Subcutaneous Given 05/07/23 1300)  lactated ringers infusion ( Intravenous New  Bag/Given 05/07/23 0930)  loratadine (CLARITIN) tablet 10 mg (10 mg Oral Given 05/07/23 0933)  gabapentin (NEURONTIN) capsule 300 mg (300 mg Oral Given 05/07/23 1002)  sertraline (ZOLOFT) tablet 100 mg (100 mg Oral Given 05/07/23 0936)  pantoprazole (PROTONIX) EC tablet 80 mg (80 mg Oral Given 05/07/23 0934)  azithromycin (ZITHROMAX) 500 mg in sodium chloride 0.9 % 250 mL IVPB (0 mg Intravenous Stopped 05/07/23 1113)  Ampicillin-Sulbactam (UNASYN) 3 g in sodium chloride 0.9 % 100 mL IVPB (0 g Intravenous Stopped 05/07/23 1250)  guaiFENesin (MUCINEX) 12 hr tablet 1,200 mg (1,200 mg Oral Given 05/07/23 1138)  HYDROcodone bit-homatropine (HYCODAN) 5-1.5 MG/5ML syrup 5 mL (has no administration in time range)  albuterol (PROVENTIL) (2.5 MG/3ML) 0.083% nebulizer solution 2.5 mg (has no administration in time range)  nicotine (NICODERM CQ - dosed in mg/24 hours) patch 21 mg (has no administration in time range)  morphine (PF) 4 MG/ML injection 4 mg (4 mg Intravenous Given 05/06/23 2323)  ondansetron (ZOFRAN) injection 4 mg (4 mg Intravenous Given 05/06/23 2324)  iohexol (OMNIPAQUE) 300 MG/ML solution 100 mL (100 mLs Intravenous Contrast Given 05/06/23 2334)  0.9 %  sodium chloride infusion (0 mLs Intravenous Stopped 05/07/23 0951)  sodium chloride 0.9 % bolus 2,000 mL (2,000 mLs Intravenous New Bag/Given 05/07/23 0950)    Mobility walks        R Recommendations: See Admitting Provider Note  Report given to: [place]

## 2023-05-07 NOTE — Progress Notes (Signed)
PROGRESS NOTE    Larry Floyd  VHQ:[id number] DOB: [birth year] DOA: 05/06/2023 PCP: Gabriel Earing, FNP    Chief Complaint  Patient presents with   Abdominal Pain    Brief Narrative:  Patient 62 year old gentleman history of hypertension, hyperlipidemia, type 2 diabetes, GERD, COPD, alcohol and tobacco use presented to the ED with abdominal pain.  Patient with complaints of epigastric abdominal pain which started around noon on Thursday, 05/05/2023 where sharp intermittent rated 8/10 with associated nonbloody vomiting and diarrhea.  Patient endorses drinking 1/5 pint of vodka daily however stated stopped drinking about a week ago.  Patient seen in the ED noted to have a leukocytosis, thrombocytosis, basic metabolic profile with a sodium of 121, chloride of 88, bicarb 21, glucose 165, BUN 14, creatinine 0.72.  Lipase elevated at 78.  CT abdomen and pelvis with contrast with concerns for groove pancreatitis with no pancreatic necrosis or loculated collections.  Focal infiltration noted in the right lung base may represent pneumonia.  Mild diffuse bladder wall thickening may indicate cystitis.  Prostate enlargement.  Fatty infiltration of the liver.  Patient placed on IV fluids, IV antiemetics and supportive care.  Patient also started empirically on IV antibiotics for probable aspiration pneumonia.   Assessment & Plan:   Principal Problem:   Hyponatremia Active Problems:   Alcohol induced acute pancreatitis   Aspiration pneumonia (HCC)   CAP (community acquired pneumonia)   Essential hypertension   Chronic obstructive pulmonary disease (HCC)   Tobacco use disorder   GERD (gastroesophageal reflux disease)   Mixed hyperlipidemia   Type 2 diabetes mellitus with hyperglycemia (HCC)   Abdominal pain   Leukocytosis   Thrombocytosis   Elevated lipase   History of chronic pancreatitis   Alcohol use disorder  #1 hyponatremia -Likely secondary to hypovolemic hyponatremia versus beer  [place] and patient with chronic alcohol use presented with nausea and vomiting. -Urine sodium noted at 18. -Urinalysis nitrite negative leukocytes negative. -TSH at 2.256. -Check a random cortisol level. -Serum osmolality pending. -Urine osmolality pending. -Continue IV fluids, supportive care. -Serial BMETs.  2.  Acute pancreatitis likely alcohol induced -Patient with a history of alcohol use states he drinks 1/5 of a pint of vodka on a daily basis and has done this for several years however quit about a week ago.  In discussions it is noted that patient had a few small airplane bottles per patient of alcohol this week.  Patient also noted to be on Farxiga. -Lipase noted to be elevated, patient presented nausea vomiting epigastric abdominal pain. -CT abdomen and pelvis with concerns for groove pancreatitis without necrosis or loculated collections. -Gallbladder and bile ducts unremarkable. -Check a fasting lipid panel. -2 L normal saline bolus then LR at 150 cc an hour. -Continue clear liquids. -Discontinue Farxiga. -IV antiemetics, pain management. -Alcohol cessation stressed to patient. -Supportive care  3.  Probable aspiration pneumonia versus CAP -CT abdomen and pelvis with focal infiltration in the right lung base may represent pneumonia. -Per patient and wife patient had numerous episodes of nausea and vomiting concern for possible aspiration pneumonia. -Check a urine Legionella antigen, check a urine pneumococcus antigen. -Placed on IV Unasyn, azithromycin, bronchodilators, Mucinex, Claritin, PPI.  4.  GERD -Resume home regimen PPI.  5.  Hypertension -Continue Norvasc 2.5 mg daily.  6.  Hyperlipidemia -Stay in.  7.  COPD -Continue Breo Ellipta, Incruse. -Place on scheduled albuterol nebs in light of aspiration pneumonia.  8.  Type 2 diabetes mellitus -Hemoglobin A1c 6.7 (  07/08/2021) -Hemoglobin A1c pending. -CBG 137 this morning. -Discontinue Farxiga as patient  presented with acute pancreatitis. -SSI.  9.  Alcohol use disorder -Continue Ativan withdrawal protocol, thiamine, folic acid, multivitamin  10.  Tobacco abuse -Tobacco cessation stressed to patient. -Nicotine patch.     DVT prophylaxis: Lovenox Code Status: Full Family Communication: Updated patient and wife at bedside. Disposition: Likely home when clinically improved.  Status is: Inpatient Remains inpatient appropriate because: Severity of illness   Consultants:  None  Procedures:  CT abdomen pelvis 05/06/2023 Chest x-ray  Antimicrobials:  Anti-infectives (From admission, onward)    Start     Dose/Rate Route Frequency Ordered Stop   05/07/23 1200  Ampicillin-Sulbactam (UNASYN) 3 g in sodium chloride 0.9 % 100 mL IVPB        3 g 200 mL/hr over 30 Minutes Intravenous Every 6 hours 05/07/23 1113     05/07/23 1000  cefTRIAXone (ROCEPHIN) 2 g in sodium chloride 0.9 % 100 mL IVPB  Status:  Discontinued        2 g 200 mL/hr over 30 Minutes Intravenous Every 24 hours 05/07/23 0828 05/07/23 1113   05/07/23 0900  azithromycin (ZITHROMAX) 500 mg in sodium chloride 0.9 % 250 mL IVPB        500 mg 250 mL/hr over 60 Minutes Intravenous Every 24 hours 05/07/23 0828 05/12/23 0859         Subjective: Patient sitting up on gurney, wife at bedside.  Patient states epigastric abdominal pain improving since admission.  Noted to have significant nausea and vomiting prior to admission per wife.  No further nausea or vomiting today.  Tolerating clear liquids.  Objective: Vitals:   05/07/23 1046 05/07/23 1100 05/07/23 1300 05/07/23 1330  BP:  134/79 127/72 137/72  Pulse:   71 82  Resp:  (!) 22 19 16   Temp:  97.7 F (36.5 C)    TempSrc:  Oral    SpO2: 97%  95% 95%  Weight:      Height:        Intake/Output Summary (Last 24 hours) at 05/07/2023 1523 Last data filed at 05/07/2023 1250 Gross per 24 hour  Intake 1656.21 ml  Output 550 ml  Net 1106.21 ml   Filed Weights    05/06/23 1703  Weight: 62.1 kg    Examination:  General exam: Appears calm and comfortable.  Extremely dry mucous membranes Respiratory system: Minimal expiratory wheezing.  Some scattered coarse breath sounds.  No crackles.  Fair air movement.  Speaking in full sentences.  No use of accessory muscles of respiration.  Cardiovascular system: S1 & S2 heard, RRR. No JVD, murmurs, rubs, gallops or clicks. No pedal edema. Gastrointestinal system: Abdomen is nondistended, soft and tender to palpation in the epigastrium.  Positive bowel sounds.  No rebound.  No guarding.  Central nervous system: Alert and oriented. No focal neurological deficits. Extremities: Symmetric 5 x 5 power. Skin: No rashes, lesions or ulcers Psychiatry: Judgement and insight appear normal. Mood & affect appropriate.     Data Reviewed: I have personally reviewed following labs and imaging studies  CBC: Recent Labs  Lab 05/06/23 1724 05/07/23 0723  WBC 18.8* 11.4*  HGB 13.7 13.7  HCT 40.6 40.0  MCV 84.6 84.6  PLT 507* 504*    Basic Metabolic Panel: Recent Labs  Lab 05/06/23 1724 05/07/23 1127  NA 121* 131*  K 4.3 4.3  CL 88* 101  CO2 21* 22  GLUCOSE 165* 105*  BUN 14 9  CREATININE 0.72 0.57*  CALCIUM 9.2 8.0*    GFR: Estimated Creatinine Clearance: 84.1 mL/min (A) (by C-G formula based on SCr of 0.57 mg/dL (L)).  Liver Function Tests: Recent Labs  Lab 05/06/23 1724  AST 17  ALT 22  ALKPHOS 120  BILITOT <0.2  PROT 8.0  ALBUMIN 3.9    CBG: Recent Labs  Lab 05/07/23 0741 05/07/23 1250  GLUCAP 137* 163*     No results found for this or any previous visit (from the past 240 hours).       Radiology Studies: CT ABDOMEN PELVIS W CONTRAST Result Date: 05/07/2023 CLINICAL DATA:  Epigastric pain. Pain above umbilical hernia. History of gastroesophageal reflux disease. Pain and vomiting started last night. EXAM: CT ABDOMEN AND PELVIS WITH CONTRAST TECHNIQUE: Multidetector CT imaging  of the abdomen and pelvis was performed using the standard protocol following bolus administration of intravenous contrast. RADIATION DOSE REDUCTION: This exam was performed according to the departmental dose-optimization program which includes automated exposure control, adjustment of the mA and/or kV according to patient size and/or use of iterative reconstruction technique. CONTRAST:  OMNIPAQUE IOHEXOL 300 MG/ML  SOLN COMPARISON:  07/18/2014 FINDINGS: Lower chest: Focal area of consolidation in the right lung base could represent focal pneumonia. Hepatobiliary: Mild diffuse fatty infiltration of the liver. Gallbladder and bile ducts are unremarkable. Pancreas: There is infiltration around the head of the pancreas and in the pancreaticoduodenal groove. This suggests groove pancreatitis. No loculated collections. No evidence of pancreatic necrosis or bile duct dilatation. Spleen: Normal in size without focal abnormality. Adrenals/Urinary Tract: Adrenal glands are unremarkable. Kidneys are normal, without renal calculi, focal lesion, or hydronephrosis. Bladder wall is diffusely thickened suggesting possible cystitis. Correlate with urinalysis. Stomach/Bowel: Stomach is within normal limits. Appendix appears normal. No evidence of bowel wall thickening, distention, or inflammatory changes. Vascular/Lymphatic: Aortic atherosclerosis. No enlarged abdominal or pelvic lymph nodes. Reproductive: Prostate gland is enlarged, measuring 5.1 cm diameter. Other: No abdominal wall hernia or abnormality. No abdominopelvic ascites. Musculoskeletal: Degenerative changes in the spine. No acute bony abnormalities. IMPRESSION: 1. 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 No pancreatic necrosis or loculated collections. 2. Focal infiltration in the right lung base may represent pneumonia. 3. Mild diffuse bladder wall thickening may indicate cystitis. Correlate with urinalysis. 4. Aortic  atherosclerosis. 5. Prostate enlargement. 6. Fatty infiltration of the liver. Electronically Signed   By: Burman Nieves M.D.   On: 05/07/2023 00:02        Scheduled Meds:  albuterol  2.5 mg Nebulization TID   amLODipine  2.5 mg Oral Daily   atorvastatin  80 mg Oral Daily   enoxaparin (LOVENOX) injection  40 mg Subcutaneous Q24H   fluticasone furoate-vilanterol  1 puff Inhalation Daily   And   umeclidinium bromide  1 puff Inhalation Daily   folic acid  1 mg Oral Daily   gabapentin  300 mg Oral BID   guaiFENesin  1,200 mg Oral BID   insulin aspart  0-9 Units Subcutaneous TID WC   loratadine  10 mg Oral Daily   multivitamin with minerals  1 tablet Oral Daily   nicotine  21 mg Transdermal Daily   pantoprazole  80 mg Oral Q1200   pantoprazole  80 mg Oral Q0600   sertraline  100 mg Oral Daily   thiamine  100 mg Oral Daily   Or   thiamine  100 mg Intravenous Daily   Continuous Infusions:  ampicillin-sulbactam (UNASYN) IV Stopped (05/07/23 1250)  azithromycin Stopped (05/07/23 1113)   lactated ringers 150 mL/hr at 05/07/23 0930     LOS: 0 days    Time spent: 40 minutes    Ramiro Harvest, MD [facility]   To contact the attending provider between 7A-7P or the covering provider during after hours 7P-7A, please log into the web site [email or web address] and access using universal [place] password for that web site. If you do not have the password, please call the hospital operator.  05/07/2023, 3:23 PM

## 2023-05-07 NOTE — H&P (Signed)
History and Physical    Patient: Larry Floyd ZDG:[id number] DOB: 01/11/1961 DOA: 05/06/2023 DOS: the patient was seen and examined on 05/07/2023 PCP: Gabriel Earing, FNP  Patient coming from: Home  Chief Complaint:  Chief Complaint  Patient presents with   Abdominal Pain   HPI: Larry Floyd is a 62 y.o. male with medical history significant of hypertension, hyperlipidemia, T2DM, GERD, COPD, alcohol and tobacco use disorder who presents to the emergency department for evaluation of abdominal pain.  Patient complained of abdominal pain which started around noon on Thursday (12/19), pain was sharp, intermittent and was rated as 8/10 on pain scale, this was associated with several episodes of nonbloody vomiting and diarrhea.  He denies fever, chills, chest pain, shortness of breath.  Patient denies use of NSAIDs including BC or Goody powders. He endorsed drinking 1/5 pint of vodka daily, but states that he stopped drinking about a week ago.  ED Course:  In the emergency department, BP was 140/81, other vital signs were within normal range.  Workup in the ED showed leukocytosis and thrombocytosis, BMP showed sodium of 121, chloride 88, bicarb 21, blood glucose 165, hypothyroidism 4.3, BUN 14, creatinine 0.72.  Lipase 78, urinalysis was normal CT abdomen and pelvis with contrast showed: 1. 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 No pancreatic necrosis or loculated collections. 2. Focal infiltration in the right lung base may represent pneumonia. 3. Mild diffuse bladder wall thickening may indicate cystitis. Correlate with urinalysis. 4. Aortic atherosclerosis. 5. Prostate enlargement. 6. Fatty infiltration of the liver. Patient was started on IV hydration due to hyponatremia, folic acid was given, thiamine and multivitamin provided and patient was started on CIWA protocol.  Zofran was given.  Review of Systems: Review of systems as noted in the  HPI. All other systems reviewed and are negative.   Past Medical History:  Diagnosis Date   Arthritis    Bronchitis    Cataract    Diabetes mellitus without complication (HCC)    Emphysema lung (HCC)    GERD (gastroesophageal reflux disease)    Hypercholesteremia    Hypertension    Past Surgical History:  Procedure Laterality Date   BACK SURGERY     Lumbar   CORONARY PRESSURE/FFR STUDY N/A 11/25/2021   Procedure: INTRAVASCULAR PRESSURE WIRE/FFR STUDY;  Surgeon: Marykay Lex, MD;  Location: [facility] INVASIVE CV LAB;  Service: Cardiovascular;  Laterality: N/A;   EYE SURGERY Bilateral    implant insertion   HERNIA REPAIR     x2   LEFT HEART CATH AND CORONARY ANGIOGRAPHY N/A 11/25/2021   Procedure: LEFT HEART CATH AND CORONARY ANGIOGRAPHY;  Surgeon: Marykay Lex, MD;  Location: [facility] INVASIVE CV LAB::   Angiographically Mod 1 V CAD w/ Dist LCx 60% - RFR NEGATIVE lesion.   Prox RCA 20%.  Mid RCA 10%.  Prox LAD to Mid LAD 15% . Normal LV function-EF~50 to 55%, with mildly elevated LVEDP. LV- gram suggest possible anterior wall motion abnormality.  REC: TTE to better assess.    Social History:  reports that he has been smoking cigarettes. He has a 135 pack-year smoking history. He has never used smokeless tobacco. He reports current alcohol use of about 5.0 standard drinks of alcohol per week. He reports that he does not use drugs.   No Known Allergies  Family History  Problem Relation Age of Onset   Stroke Mother    Hyperlipidemia Mother    Cancer Mother  throat cancer   Hypertension Mother    Atrial fibrillation Mother    Hypertension Father    Hyperlipidemia Father    COPD Father    Heart disease Father    AAA (abdominal aortic aneurysm) Father    Heart attack Father    Diabetes Sister    Heart murmur Sister    Hearing loss Daughter     History of Prior to Admission medications   Medication Sig Start Date End Date Taking? Authorizing Provider  albuterol  (VENTOLIN HFA) 108 (90 Base) MCG/ACT inhaler Inhale 2 puffs into the lungs every 6 (six) hours as needed for wheezing or shortness of breath. 03/23/23   Gabriel Earing, FNP  amLODipine (NORVASC) 2.5 MG tablet Take 1 tablet (2.5 mg total) by mouth daily. 12/20/22   Gabriel Earing, FNP  aspirin EC 81 MG tablet Take 81 mg by mouth in the morning. Swallow whole. Patient not taking: Reported on 04/25/2023    [provider]  atorvastatin (LIPITOR) 80 MG tablet Take 1 tablet (80 mg total) by mouth daily. 04/25/23   Gabriel Earing, FNP  bisoprolol (ZEBETA) 5 MG tablet Take 1 tablet by mouth once daily 01/03/23   Marykay Lex, MD  cetirizine (ZYRTEC) 10 MG tablet Take 0.5 tablets (5 mg total) by mouth daily. Patient taking differently: Take 10 mg by mouth daily. 04/13/21   Jacquelin Hawking, PA-C  dapagliflozin propanediol (FARXIGA) 10 MG TABS tablet Take 1 tablet (10 mg total) by mouth daily before breakfast. 12/20/22   Gabriel Earing, FNP  esomeprazole (NEXIUM) 40 MG capsule Take 1 capsule (40 mg total) by mouth daily at 12 noon. 04/25/23   Gabriel Earing, FNP  Fluticasone-Umeclidin-Vilant (TRELEGY ELLIPTA) 100-62.5-25 MCG/ACT AEPB Inhale 1 Inhalation into the lungs daily. 07/28/22   Gabriel Earing, FNP  gabapentin (NEURONTIN) 600 MG tablet Take 0.5 tablet by mouth in the morning then take a full tablet by mouth at bedtime. 12/20/22   Gabriel Earing, FNP  meloxicam (MOBIC) 15 MG tablet Take 1 tablet (15 mg total) by mouth daily. 12/20/22   Gabriel Earing, FNP  metFORMIN (GLUCOPHAGE) 1000 MG tablet Take 1 tablet (1,000 mg total) by mouth 2 (two) times daily with a meal. 12/20/22   Gabriel Earing, FNP  Omega-3 Fatty Acids (FISH OIL) 1000 MG CPDR Take 1,000 mg by mouth daily. And then increase to 3000 mg daily after 3 weeks 03/19/22   Marykay Lex, MD  sertraline (ZOLOFT) 100 MG tablet Take 1 tablet (100 mg total) by mouth daily. 04/25/23   Gabriel Earing, FNP  sitaGLIPtin  (JANUVIA) 100 MG tablet Take 1 tablet (100 mg total) by mouth daily. 12/20/22   Gabriel Earing, FNP    Physical Exam: BP 123/76   Pulse 61   Temp 98.3 F (36.8 C) (Oral)   Resp 18   Ht 5\' 7"  (1.702 m)   Wt 62.1 kg   SpO2 95%   BMI 21.46 kg/m   General: 62 y.o. year-old male well developed well nourished in no acute distress.  Alert and oriented x3. HEENT: NCAT, EOMI Neck: Supple, trachea medial Cardiovascular: Regular rate and rhythm with no rubs or gallops.  No thyromegaly or JVD noted.  No lower extremity edema. 2/4 pulses in all 4 extremities. Respiratory: Breath sounds clear to auscultation with no wheezes or rales. Good inspiratory effort. Abdomen: Soft, tender to palpation of epigastrium without guarding.  Normal bowel sounds x4 quadrants. Muskuloskeletal: No  cyanosis, clubbing or edema noted bilaterally Neuro: CN II-XII intact, strength 5/5 x 4, sensation, reflexes intact Skin: No ulcerative lesions noted or rashes Psychiatry: Judgement and insight appear normal. Mood is appropriate for condition and setting          Labs on Admission:  Basic Metabolic Panel: Recent Labs  Lab 05/06/23 1724  NA 121*  K 4.3  CL 88*  CO2 21*  GLUCOSE 165*  BUN 14  CREATININE 0.72  CALCIUM 9.2   Liver Function Tests: Recent Labs  Lab 05/06/23 1724  AST 17  ALT 22  ALKPHOS 120  BILITOT <0.2  PROT 8.0  ALBUMIN 3.9   Recent Labs  Lab 05/06/23 1724  LIPASE 78*   No results for input(s): "AMMONIA" in the last 168 hours. CBC: Recent Labs  Lab 05/06/23 1724  WBC 18.8*  HGB 13.7  HCT 40.6  MCV 84.6  PLT 507*   Cardiac Enzymes: No results for input(s): "CKTOTAL", "CKMB", "CKMBINDEX", "TROPONINI" in the last 168 hours.  BNP (last 3 results) No results for input(s): "BNP" in the last 8760 hours.  ProBNP (last 3 results) No results for input(s): "PROBNP" in the last 8760 hours.  CBG: No results for input(s): "GLUCAP" in the last 168 hours.  Radiological Exams  on Admission: CT ABDOMEN PELVIS W CONTRAST Result Date: 05/07/2023 CLINICAL DATA:  Epigastric pain. Pain above umbilical hernia. History of gastroesophageal reflux disease. Pain and vomiting started last night. EXAM: CT ABDOMEN AND PELVIS WITH CONTRAST TECHNIQUE: Multidetector CT imaging of the abdomen and pelvis was performed using the standard protocol following bolus administration of intravenous contrast. RADIATION DOSE REDUCTION: This exam was performed according to the departmental dose-optimization program which includes automated exposure control, adjustment of the mA and/or kV according to patient size and/or use of iterative reconstruction technique. CONTRAST:  OMNIPAQUE IOHEXOL 300 MG/ML  SOLN COMPARISON:  07/18/2014 FINDINGS: Lower chest: Focal area of consolidation in the right lung base could represent focal pneumonia. Hepatobiliary: Mild diffuse fatty infiltration of the liver. Gallbladder and bile ducts are unremarkable. Pancreas: There is infiltration around the head of the pancreas and in the pancreaticoduodenal groove. This suggests groove pancreatitis. No loculated collections. No evidence of pancreatic necrosis or bile duct dilatation. Spleen: Normal in size without focal abnormality. Adrenals/Urinary Tract: Adrenal glands are unremarkable. Kidneys are normal, without renal calculi, focal lesion, or hydronephrosis. Bladder wall is diffusely thickened suggesting possible cystitis. Correlate with urinalysis. Stomach/Bowel: Stomach is within normal limits. Appendix appears normal. No evidence of bowel wall thickening, distention, or inflammatory changes. Vascular/Lymphatic: Aortic atherosclerosis. No enlarged abdominal or pelvic lymph nodes. Reproductive: Prostate gland is enlarged, measuring 5.1 cm diameter. Other: No abdominal wall hernia or abnormality. No abdominopelvic ascites. Musculoskeletal: Degenerative changes in the spine. No acute bony abnormalities. IMPRESSION: 1. Infiltration  around the head of the pancreas suggesting groove pancreatitis. No pancreatic necrosis or loculated collections. 2. Focal infiltration in the right lung base may represent pneumonia. 3. Mild diffuse bladder wall thickening may indicate cystitis. Correlate with urinalysis. 4. Aortic atherosclerosis. 5. Prostate enlargement. 6. Fatty infiltration of the liver. Electronically Signed   By: Burman Nieves M.D.   On: 05/07/2023 00:02    EKG: I independently viewed the EKG done and my findings are as followed: Normal sinus rhythm at rate of 61 bpm  Assessment/Plan Present on Admission:  Hyponatremia  Tobacco use disorder  Essential hypertension  Mixed hyperlipidemia  GERD (gastroesophageal reflux disease)  Chronic obstructive pulmonary disease (HCC)  Type 2  diabetes mellitus with hyperglycemia (HCC)  Principal Problem:   Hyponatremia Active Problems:   Essential hypertension   Chronic obstructive pulmonary disease (HCC)   Tobacco use disorder   GERD (gastroesophageal reflux disease)   Mixed hyperlipidemia   Type 2 diabetes mellitus with hyperglycemia (HCC)   Abdominal pain   Leukocytosis   Thrombocytosis   Elevated lipase   History of chronic pancreatitis   Alcohol use disorder  Hyponatremia This may be due to beer potomania, but it would be reasonable to find out other differential diagnosis Urine osmolality, serum osmolality and urine sodium will be checked Continue gentle hydration Continue to monitor sodium with serial BMPs  Abdominal pain Continue IV hydration Continue IV morphine 2 mg q.4h p.r.n. for moderate to severe pain Continue IV Zofran p.r.n. Continue clear liquid diet with plan to advance diet as tolerated  Leukocytosis possibly reactive Thrombocytosis possibly reactive Continue to monitor CBC with morning labs  Elevated lipase level History of chronic pancreatitis Lipase 78, this may be related to patient's history of pancreatitis Continue to monitor lipase  level  Essential Hypertension Continue Norvasc  Mixed hyperlipidemia Continue Lipitor  GERD Continue Protonix  COPD Continue albuterol inhaler, continue Breo Ellipta and Incruse Ellipta  T2DM with hyperglycemia Continue ISS and hypoglycemia protocol Continue Farxiga  Alcohol use disorder Patient was started on CIWA protocol in anticipation for possible alcohol withdrawal  Tobacco use disorder Patient was counseled on tobacco abuse cessation  DVT prophylaxis: Lovenox  Code Status: Full code  Family Communication: Wife at bedside (all questions answered to satisfaction)  Consults: None  Severity of Illness: The appropriate patient status for this patient is INPATIENT. Inpatient status is judged to be reasonable and necessary in order to provide the required intensity of service to ensure the patient's safety. The patient's presenting symptoms, physical exam findings, and initial radiographic and laboratory data in the context of their chronic comorbidities is felt to place them at high risk for further clinical deterioration. Furthermore, it is not anticipated that the patient will be medically stable for discharge from the hospital within 2 midnights of admission.   * I certify that at the point of admission it is my clinical judgment that the patient will require inpatient hospital care spanning beyond 2 midnights from the point of admission due to high intensity of service, high risk for further deterioration and high frequency of surveillance required.*  Author: Frankey Shown, DO 05/07/2023 6:16 AM  For on call review [email or web address].

## 2023-05-08 DIAGNOSIS — E871 Hypo-osmolality and hyponatremia: Secondary | ICD-10-CM | POA: Diagnosis not present

## 2023-05-08 DIAGNOSIS — K219 Gastro-esophageal reflux disease without esophagitis: Secondary | ICD-10-CM | POA: Diagnosis not present

## 2023-05-08 DIAGNOSIS — J432 Centrilobular emphysema: Secondary | ICD-10-CM | POA: Diagnosis not present

## 2023-05-08 DIAGNOSIS — E782 Mixed hyperlipidemia: Secondary | ICD-10-CM | POA: Diagnosis not present

## 2023-05-08 DIAGNOSIS — D72829 Elevated white blood cell count, unspecified: Secondary | ICD-10-CM

## 2023-05-08 LAB — HIV ANTIBODY (ROUTINE TESTING W REFLEX): HIV Screen 4th Generation wRfx: NONREACTIVE

## 2023-05-08 LAB — GLUCOSE, CAPILLARY
Glucose-Capillary: 145 mg/dL — ABNORMAL HIGH (ref 70–99)
Glucose-Capillary: 169 mg/dL — ABNORMAL HIGH (ref 70–99)
Glucose-Capillary: 194 mg/dL — ABNORMAL HIGH (ref 70–99)

## 2023-05-08 LAB — CBC
HCT: 39.5 % (ref 39.0–52.0)
Hemoglobin: 13 g/dL (ref 13.0–17.0)
MCH: 28.6 pg (ref 26.0–34.0)
MCHC: 32.9 g/dL (ref 30.0–36.0)
MCV: 86.8 fL (ref 80.0–100.0)
Platelets: 471 10*3/uL — ABNORMAL HIGH (ref 150–400)
RBC: 4.55 MIL/uL (ref 4.22–5.81)
RDW: 15.7 % — ABNORMAL HIGH (ref 11.5–15.5)
WBC: 12.2 10*3/uL — ABNORMAL HIGH (ref 4.0–10.5)
nRBC: 0 % (ref 0.0–0.2)

## 2023-05-08 LAB — LIPID PANEL
Cholesterol: 112 mg/dL (ref 0–200)
HDL: 36 mg/dL — ABNORMAL LOW (ref >40)
LDL Cholesterol: 55 mg/dL (ref 0–99)
Total CHOL/HDL Ratio: 3.1 {ratio}
Triglycerides: 105 mg/dL (ref <150)
VLDL: 21 mg/dL (ref 0–40)

## 2023-05-08 LAB — COMPREHENSIVE METABOLIC PANEL
ALT: 19 U/L (ref 0–44)
AST: 15 U/L (ref 15–41)
Albumin: 3.5 g/dL (ref 3.5–5.0)
Alkaline Phosphatase: 112 U/L (ref 38–126)
Anion gap: 10 (ref 5–15)
BUN: 5 mg/dL — ABNORMAL LOW (ref 8–23)
CO2: 23 mmol/L (ref 22–32)
Calcium: 9.3 mg/dL (ref 8.9–10.3)
Chloride: 101 mmol/L (ref 98–111)
Creatinine, Ser: 0.59 mg/dL — ABNORMAL LOW (ref 0.61–1.24)
GFR, Estimated: >60 mL/min (ref >60)
Glucose, Bld: 139 mg/dL — ABNORMAL HIGH (ref 70–99)
Potassium: 4.1 mmol/L (ref 3.5–5.1)
Sodium: 134 mmol/L — ABNORMAL LOW (ref 135–145)
Total Bilirubin: 0.4 mg/dL (ref <1.2)
Total Protein: 7.4 g/dL (ref 6.5–8.1)

## 2023-05-08 LAB — LIPASE, BLOOD: Lipase: 53 U/L — ABNORMAL HIGH (ref 11–51)

## 2023-05-08 LAB — MAGNESIUM: Magnesium: 1.8 mg/dL (ref 1.7–2.4)

## 2023-05-08 LAB — PHOSPHORUS: Phosphorus: 3.1 mg/dL (ref 2.5–4.6)

## 2023-05-08 MED ORDER — AMOXICILLIN-POT CLAVULANATE 875-125 MG PO TABS: 1.0000 | ORAL_TABLET | Freq: Two times a day (BID) | ORAL | 0 refills | Status: AC

## 2023-05-08 MED ORDER — NICOTINE 21 MG/24HR TD PT24: 21.0000 mg | MEDICATED_PATCH | Freq: Every day | TRANSDERMAL | 0 refills | Status: DC

## 2023-05-08 MED ORDER — ONDANSETRON HCL 4 MG PO TABS: 4.0000 mg | ORAL_TABLET | Freq: Four times a day (QID) | ORAL | 0 refills | Status: AC | PRN

## 2023-05-08 MED ORDER — ALBUTEROL SULFATE HFA 108 (90 BASE) MCG/ACT IN AERS: INHALATION_SPRAY | RESPIRATORY_TRACT | Status: DC

## 2023-05-08 MED ORDER — HYDROCODONE BIT-HOMATROP MBR 5-1.5 MG/5ML PO SOLN: 5.0000 mL | Freq: Four times a day (QID) | ORAL | 0 refills | Status: DC | PRN

## 2023-05-08 MED ORDER — ADULT MULTIVITAMIN W/MINERALS CH: 1.0000 | ORAL_TABLET | Freq: Every day | ORAL | Status: DC

## 2023-05-08 MED ORDER — LACTATED RINGERS IV SOLN: INTRAVENOUS | Status: DC

## 2023-05-08 MED ORDER — FOLIC ACID 1 MG PO TABS: 1.0000 mg | ORAL_TABLET | Freq: Every day | ORAL | Status: AC

## 2023-05-08 MED ORDER — GUAIFENESIN ER 600 MG PO TB12: 1200.0000 mg | ORAL_TABLET | Freq: Two times a day (BID) | ORAL | 0 refills | Status: AC

## 2023-05-08 MED ORDER — ACETAMINOPHEN 325 MG PO TABS: 650.0000 mg | ORAL_TABLET | Freq: Four times a day (QID) | ORAL | Status: AC | PRN

## 2023-05-08 MED ORDER — VITAMIN B-1 100 MG PO TABS: 100.0000 mg | ORAL_TABLET | Freq: Every day | ORAL | Status: AC

## 2023-05-08 NOTE — Progress Notes (Signed)
   05/08/23 1715  TOC Brief Assessment  Insurance and Status Reviewed  Patient has primary care physician Yes  Home environment has been reviewed From home, spouse  Prior level of function: Independent  Prior/Current Home Services No current home services  Social Drivers of Health Review SDOH reviewed no interventions necessary  Readmission risk has been reviewed Yes  Transition of care needs no transition of care needs at this time

## 2023-05-08 NOTE — Discharge Summary (Signed)
Physician Discharge Summary  Larry Floyd WGN:[id number] DOB: [birth year] DOA: 05/06/2023  PCP: Gabriel Earing, FNP  Admit date: 05/06/2023 Discharge date: 05/08/2023  Time spent: 60 minutes  Recommendations for Outpatient Follow-up:  Follow-up with Gabriel Earing, FNP in 2 weeks.  On follow-up patient will need a basic metabolic profile done to follow-up on electrolytes and renal function.  Pneumonia will need to be followed up upon.   Discharge Diagnoses:  Principal Problem:   Hyponatremia Active Problems:   Alcohol induced acute pancreatitis   Aspiration pneumonia (HCC)   CAP (community acquired pneumonia)   Essential hypertension   Chronic obstructive pulmonary disease (HCC)   Tobacco use disorder   GERD (gastroesophageal reflux disease)   Mixed hyperlipidemia   Type 2 diabetes mellitus with hyperglycemia (HCC)   Abdominal pain   Leukocytosis   Thrombocytosis   Elevated lipase   History of chronic pancreatitis   Alcohol use disorder   Discharge Condition: Stable and improved.  Diet recommendation: Heart healthy  Filed Weights   05/06/23 1703 05/07/23 1642  Weight: 62.1 kg 61.6 kg    History of present illness:  HPI per Dr. Sharlyn Bologna is a 62 y.o. male with medical history significant of hypertension, hyperlipidemia, T2DM, GERD, COPD, alcohol and tobacco use disorder who presents to the emergency department for evaluation of abdominal pain.  Patient complained of abdominal pain which started around noon on Thursday (12/19), pain was sharp, intermittent and was rated as 8/10 on pain scale, this was associated with several episodes of nonbloody vomiting and diarrhea.  He denies fever, chills, chest pain, shortness of breath.  Patient denies use of NSAIDs including BC or Goody powders. He endorsed drinking 1/5 pint of vodka daily, but states that he stopped drinking about a week ago.   ED Course:  In the emergency department, BP was 140/81, other  vital signs were within normal range.  Workup in the ED showed leukocytosis and thrombocytosis, BMP showed sodium of 121, chloride 88, bicarb 21, blood glucose 165, hypothyroidism 4.3, BUN 14, creatinine 0.72.  Lipase 78, urinalysis was normal CT abdomen and pelvis with contrast showed: 1. 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 No pancreatic necrosis or loculated collections. 2. Focal infiltration in the right lung base may represent pneumonia. 3. Mild diffuse bladder wall thickening may indicate cystitis. Correlate with urinalysis. 4. Aortic atherosclerosis. 5. Prostate enlargement. 6. Fatty infiltration of the liver. Patient was started on IV hydration due to hyponatremia, folic acid was given, thiamine and multivitamin provided and patient was started on CIWA protocol.  Zofran was given.  Hospital Course:  #1 hyponatremia -Likely secondary to hypovolemic hyponatremia versus beer Potomania and patient with chronic alcohol use presented with nausea and vomiting. -Urine sodium noted at 18. -Serum osmolality noted at 283. -Urine osmolality noted at 185. -Urinalysis nitrite negative leukocytes negative. -TSH at 2.256. -Random cortisol noted at 9.6.   -Patient hydrated IV fluids with improvement with hyponatremia such that by day of discharge sodium was up to 134 from 121 on admission.   -Patient will be discharged in stable and improved condition.   -Outpatient follow-up with PCP.     2.  Acute pancreatitis likely alcohol induced -Patient with a history of alcohol use states he drinks 1/5 of a pint of vodka on a daily basis and has done this for several years however quit about a week ago.  In discussions it is noted that patient had a few small  airplane bottles per patient of alcohol this week.  Patient also noted to be on Farxiga. -Lipase noted to be elevated, patient presented nausea vomiting epigastric abdominal pain. -CT abdomen and pelvis with  concerns for groove pancreatitis without necrosis or loculated collections. -Gallbladder and bile ducts unremarkable. -Fasting lipid panel obtained with a total cholesterol 112, HDL of 36, LDL of 55, triglycerides of 105. -Patient initially placed on clear liquids, aggressively hydrated with IV fluids, Farxiga subsequently discontinued and will not be resumed on discharge. -Patient maintained on IV antiemetics, pain management. -Patient improved clinically, diet advanced to full liquid diet which she tolerated and subsequently a soft diet which she tolerated. -Alcohol cessation stressed to patient. -Patient was discharged home in stable and improved condition.   3.  Probable aspiration pneumonia versus CAP -CT abdomen and pelvis with focal infiltration in the right lung base may represent pneumonia. -Per patient and wife patient had numerous episodes of nausea and vomiting concern for possible aspiration pneumonia. -Urine pneumococcus antigen noted to be negative.   -Urine Legionella antigen was pending at time of discharge.   -Patient was placed on IV Unasyn, received a dose of IV Rocephin and azithromycin, was placed on bronchodilators, Mucinex, Claritin, PPI.   -Patient improved clinically and patient be discharged home on 5 more days of Augmentin to complete a 7-day course of antibiotic treatment.   -Outpatient follow-up with PCP.   4.  GERD -Patient maintained on home regimen PPI.   5.  Hypertension -Controlled on home regimen Norvasc 2.5 mg daily.   6.  Hyperlipidemia -Patient maintained on statin.   7.  COPD -Patient maintained on home regimen Breo Ellipta, Incruse. -Patient also placed on scheduled albuterol nebs due to presentation with aspiration pneumonia.  -Tobacco cessation stressed to patient. -Outpatient follow-up.     8.  Type 2 diabetes mellitus -Hemoglobin A1c 6.7 (07/08/2021) -Hemoglobin A1c noted at 7.1. -Patient maintained on sliding scale insulin during the  hospitalization. -Patient's Marcelline Deist was discontinued during the hospitalization as patient had presented with acute pancreatitis and will not be resumed on discharge. -Patient was discharged back on home regimen of Januvia and metformin. -Outpatient follow-up with PCP.   9.  Alcohol use disorder -Patient during the hospitalization had no significant episodes of withdrawal.  -Patient was maintained on Ativan withdrawal protocol, thiamine, folic acid, multivitamin -Alcohol cessation stressed to patient. -Outpatient follow-up with PCP.   10.  Tobacco abuse -Tobacco cessation stressed to patient. -Patient maintained on a nicotine patch.      Procedures: CT abdomen pelvis 05/06/2023 Chest x-ray  Consultations: None  Discharge Exam: Vitals:   05/08/23 1234 05/08/23 1426  BP: 124/77   Pulse: 72   Resp: 18   Temp: 98.4 F (36.9 C)   SpO2: 97% 94%    General: NAD Cardiovascular: Regular rate rhythm no murmurs rubs or gallops.  No JVD.  No lower extremity edema. Respiratory: Clear to auscultation bilaterally.  No wheezes, no crackles.  Some scattered coarse breath sounds.  Speaking in full sentences.  Fair air movement. Gastrointestinal: Abdomen is soft, nontender, nondistended, positive bowel sounds.  No rebound.  No guarding.  Discharge Instructions   Discharge Instructions     Diet - low sodium heart healthy   Complete by: As directed    Increase activity slowly   Complete by: As directed       Allergies as of 05/08/2023   No Known Allergies      Medication List     STOP taking  these medications    aspirin EC 81 MG tablet   dapagliflozin propanediol 10 MG Tabs tablet Commonly known as: Farxiga       TAKE these medications    acetaminophen 325 MG tablet Commonly known as: TYLENOL Take 2 tablets (650 mg total) by mouth every 6 (six) hours as needed for mild pain (pain score 1-3) (or Fever >/= 101).   albuterol 108 (90 Base) MCG/ACT inhaler Commonly  known as: VENTOLIN HFA Inhale 2 puffs into the lungs 3 (three) times daily for 5 days, THEN 2 puffs every 6 (six) hours as needed for wheezing or shortness of breath. Start taking on: May 08, 2023 What changed: See the new instructions.   amLODipine 2.5 MG tablet Commonly known as: NORVASC Take 1 tablet (2.5 mg total) by mouth daily.   amoxicillin-clavulanate 875-125 MG tablet Commonly known as: AUGMENTIN Take 1 tablet by mouth 2 (two) times daily for 5 days.   atorvastatin 80 MG tablet Commonly known as: LIPITOR Take 1 tablet (80 mg total) by mouth daily.   bisoprolol 5 MG tablet Commonly known as: ZEBETA Take 1 tablet by mouth once daily   cetirizine 10 MG tablet Commonly known as: ZYRTEC Take 0.5 tablets (5 mg total) by mouth daily. What changed: how much to take   esomeprazole 40 MG capsule Commonly known as: NexIUM Take 1 capsule (40 mg total) by mouth daily at 12 noon.   Fish Oil 1000 MG Cpdr Take 1,000 mg by mouth daily. And then increase to 3000 mg daily after 3 weeks   folic acid 1 MG tablet Commonly known as: FOLVITE Take 1 tablet (1 mg total) by mouth daily. Start taking on: May 09, 2023   gabapentin 600 MG tablet Commonly known as: NEURONTIN Take 0.5 tablet by mouth in the morning then take a full tablet by mouth at bedtime.   guaiFENesin 600 MG 12 hr tablet Commonly known as: MUCINEX Take 2 tablets (1,200 mg total) by mouth 2 (two) times daily for 5 days.   HYDROcodone bit-homatropine 5-1.5 MG/5ML syrup Commonly known as: HYCODAN Take 5 mLs by mouth every 6 (six) hours as needed for cough.   meloxicam 15 MG tablet Commonly known as: MOBIC Take 1 tablet (15 mg total) by mouth daily.   metFORMIN 1000 MG tablet Commonly known as: GLUCOPHAGE Take 1 tablet (1,000 mg total) by mouth 2 (two) times daily with a meal.   multivitamin with minerals Tabs tablet Take 1 tablet by mouth daily. Start taking on: May 09, 2023   nicotine 21  mg/24hr patch Commonly known as: NICODERM CQ - dosed in mg/24 hours Place 1 patch (21 mg total) onto the skin daily. Start taking on: May 09, 2023   ondansetron 4 MG tablet Commonly known as: ZOFRAN Take 1 tablet (4 mg total) by mouth every 6 (six) hours as needed for nausea.   sertraline 100 MG tablet Commonly known as: Zoloft Take 1 tablet (100 mg total) by mouth daily.   sitaGLIPtin 100 MG tablet Commonly known as: Januvia Take 1 tablet (100 mg total) by mouth daily.   thiamine 100 MG tablet Commonly known as: Vitamin B-1 Take 1 tablet (100 mg total) by mouth daily. Start taking on: May 09, 2023   Trelegy Ellipta 100-62.5-25 MCG/ACT Aepb Generic drug: Fluticasone-Umeclidin-Vilant Inhale 1 Inhalation into the lungs daily.       No Known Allergies  Follow-up Information     Gabriel Earing, FNP. Schedule an appointment as soon as  possible for a visit in 2 week(s).   Specialty: Family Medicine Contact information: [address] [place] [address] [phone]                  The results of significant diagnostics from this hospitalization (including imaging, microbiology, ancillary and laboratory) are listed below for reference.    Significant Diagnostic Studies: CT ABDOMEN PELVIS W CONTRAST Result Date: 05/07/2023 CLINICAL DATA:  Epigastric pain. Pain above umbilical hernia. History of gastroesophageal reflux disease. Pain and vomiting started last night. EXAM: CT ABDOMEN AND PELVIS WITH CONTRAST TECHNIQUE: Multidetector CT imaging of the abdomen and pelvis was performed using the standard protocol following bolus administration of intravenous contrast. RADIATION DOSE REDUCTION: This exam was performed according to the departmental dose-optimization program which includes automated exposure control, adjustment of the mA and/or kV according to patient size and/or use of iterative reconstruction technique. CONTRAST:  OMNIPAQUE IOHEXOL 300  MG/ML  SOLN COMPARISON:  07/18/2014 FINDINGS: Lower chest: Focal area of consolidation in the right lung base could represent focal pneumonia. Hepatobiliary: Mild diffuse fatty infiltration of the liver. Gallbladder and bile ducts are unremarkable. Pancreas: There is infiltration around the head of the pancreas and in the pancreaticoduodenal groove. This suggests groove pancreatitis. No loculated collections. No evidence of pancreatic necrosis or bile duct dilatation. Spleen: Normal in size without focal abnormality. Adrenals/Urinary Tract: Adrenal glands are unremarkable. Kidneys are normal, without renal calculi, focal lesion, or hydronephrosis. Bladder wall is diffusely thickened suggesting possible cystitis. Correlate with urinalysis. Stomach/Bowel: Stomach is within normal limits. Appendix appears normal. No evidence of bowel wall thickening, distention, or inflammatory changes. Vascular/Lymphatic: Aortic atherosclerosis. No enlarged abdominal or pelvic lymph nodes. Reproductive: Prostate gland is enlarged, measuring 5.1 cm diameter. Other: No abdominal wall hernia or abnormality. No abdominopelvic ascites. Musculoskeletal: Degenerative changes in the spine. No acute bony abnormalities. IMPRESSION: 1. 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 No pancreatic necrosis or loculated collections. 2. Focal infiltration in the right lung base may represent pneumonia. 3. Mild diffuse bladder wall thickening may indicate cystitis. Correlate with urinalysis. 4. Aortic atherosclerosis. 5. Prostate enlargement. 6. Fatty infiltration of the liver. Electronically Signed   By: Burman Nieves M.D.   On: 05/07/2023 00:02   DG Chest 2 View Result Date: 04/25/2023 CLINICAL DATA:  Short of breath after rib injury EXAM: CHEST - 2 VIEW COMPARISON:  04/20/2023 FINDINGS: Frontal and lateral views of the chest demonstrate a stable cardiac silhouette. No acute airspace disease, effusion, or  pneumothorax. Stable background emphysema with biapical scarring again noted. Chronic healed left rib fractures are noted. No acute bony abnormalities. IMPRESSION: 1. Stable emphysema.  No acute intrathoracic process. Electronically Signed   By: Sharlet Salina M.D.   On: 04/25/2023 16:46   DG Ribs Unilateral W/Chest Right Result Date: 04/21/2023 CLINICAL DATA:  Multiple falls with subsequent right-sided rib pain. EXAM: RIGHT RIBS AND CHEST - 3+ VIEW COMPARISON:  None Available. FINDINGS: No fracture or other bone lesions are seen involving the ribs. Multiple chronic left-sided rib fractures are seen. A chronic ninth right rib deformity is also noted. There is no evidence of pneumothorax or pleural effusion. Both lungs are clear. Heart size and mediastinal contours are within normal limits. IMPRESSION: 1. Multiple chronic bilateral rib fractures. 2. No acute rib fractures. Electronically Signed   By: Aram Candela M.D.   On: 04/21/2023 00:05   CT Head Wo Contrast Result Date: 04/21/2023 CLINICAL DATA:  Multiple falls today. EXAM:  CT HEAD WITHOUT CONTRAST TECHNIQUE: Contiguous axial images were obtained from the base of the skull through the vertex without intravenous contrast. RADIATION DOSE REDUCTION: This exam was performed according to the departmental dose-optimization program which includes automated exposure control, adjustment of the mA and/or kV according to patient size and/or use of iterative reconstruction technique. COMPARISON:  None Available. FINDINGS: Brain: There is mild cerebral atrophy with widening of the extra-axial spaces and ventricular dilatation. There are areas of decreased attenuation within the Olejnik matter tracts of the supratentorial brain, consistent with microvascular disease changes. Vascular: There is moderate to marked severity bilateral cavernous carotid artery calcification. Skull: Normal. Negative for fracture or focal lesion. Sinuses/Orbits: No acute finding. Other:  None. IMPRESSION: 1. No acute intracranial abnormality. 2. Generalized cerebral atrophy with widening of the extra-axial spaces and ventricular dilatation. 3. Moderate to marked severity bilateral cavernous carotid artery calcification. Electronically Signed   By: Aram Candela M.D.   On: 04/21/2023 00:03   LONG TERM MONITOR (3-14 DAYS) Result Date: 04/13/2023 Patch Wear Time:  10 days and 22 hours (2024-11-06T15:33:27-0500 to 2024-11-17T13:50:16-0500) Patient had a min HR of 55 bpm, max HR of 214 bpm, and avg HR of 80 bpm. Predominant underlying rhythm was Sinus Rhythm. 19 Supraventricular Tachycardia runs occurred, the run with the fastest interval lasting 7 beats with a max rate of 214 bpm, the longest lasting 28.0 secs with an avg rate of 144 bpm. Isolated SVEs were rare (<1.0%), SVE Couplets were rare (<1.0%), and SVE Triplets were rare (<1.0%). Isolated VEs were rare (<1.0%), and no VE Couplets or VE Triplets were present.    Microbiology: No results found for this or any previous visit (from the past 240 hours).   Labs: Basic Metabolic Panel: Recent Labs  Lab 05/06/23 1724 05/07/23 1127 05/08/23 0531  NA 121* 131* 134*  K 4.3 4.3 4.1  CL 88* 101 101  CO2 21* 22 23  GLUCOSE 165* 105* 139*  BUN 14 9 5*  CREATININE 0.72 0.57* 0.59*  CALCIUM 9.2 8.0* 9.3  MG  --   --  1.8  PHOS  --   --  3.1   Liver Function Tests: Recent Labs  Lab 05/06/23 1724 05/08/23 0531  AST 17 15  ALT 22 19  ALKPHOS 120 112  BILITOT <0.2 0.4  PROT 8.0 7.4  ALBUMIN 3.9 3.5   Recent Labs  Lab 05/06/23 1724 05/08/23 0531  LIPASE 78* 53*   No results for input(s): "AMMONIA" in the last 168 hours. CBC: Recent Labs  Lab 05/06/23 1724 05/07/23 0723 05/08/23 0531  WBC 18.8* 11.4* 12.2*  HGB 13.7 13.7 13.0  HCT 40.6 40.0 39.5  MCV 84.6 84.6 86.8  PLT 507* 504* 471*   Cardiac Enzymes: No results for input(s): "CKTOTAL", "CKMB", "CKMBINDEX", "TROPONINI" in the last 168 hours. BNP: BNP  (last 3 results) No results for input(s): "BNP" in the last 8760 hours.  ProBNP (last 3 results) No results for input(s): "PROBNP" in the last 8760 hours.  CBG: Recent Labs  Lab 05/07/23 1250 05/07/23 1713 05/07/23 2148 05/08/23 0719 05/08/23 1108  GLUCAP 163* 90 190* 145* 169*       Signed:  Ramiro Harvest MD.  [facility] 05/08/2023, 3:27 PM

## 2023-05-08 NOTE — Progress Notes (Signed)
Nsg Discharge Note  Admit Date:  05/06/2023 Discharge date: 05/08/2023   Larry Floyd to be D/C'd Home per MD order.  AVS completed.  Copy for chart, and copy for patient signed, and dated. Patient/caregiver able to verbalize understanding.  Discharge Medication: Allergies as of 05/08/2023   No Known Allergies      Medication List     STOP taking these medications    aspirin EC 81 MG tablet   dapagliflozin propanediol 10 MG Tabs tablet Commonly known as: Farxiga       TAKE these medications    acetaminophen 325 MG tablet Commonly known as: TYLENOL Take 2 tablets (650 mg total) by mouth every 6 (six) hours as needed for mild pain (pain score 1-3) (or Fever >/= 101).   albuterol 108 (90 Base) MCG/ACT inhaler Commonly known as: VENTOLIN HFA Inhale 2 puffs into the lungs 3 (three) times daily for 5 days, THEN 2 puffs every 6 (six) hours as needed for wheezing or shortness of breath. Start taking on: May 08, 2023 What changed: See the new instructions.   amLODipine 2.5 MG tablet Commonly known as: NORVASC Take 1 tablet (2.5 mg total) by mouth daily.   amoxicillin-clavulanate 875-125 MG tablet Commonly known as: AUGMENTIN Take 1 tablet by mouth 2 (two) times daily for 5 days.   atorvastatin 80 MG tablet Commonly known as: LIPITOR Take 1 tablet (80 mg total) by mouth daily.   bisoprolol 5 MG tablet Commonly known as: ZEBETA Take 1 tablet by mouth once daily   cetirizine 10 MG tablet Commonly known as: ZYRTEC Take 0.5 tablets (5 mg total) by mouth daily. What changed: how much to take   esomeprazole 40 MG capsule Commonly known as: NexIUM Take 1 capsule (40 mg total) by mouth daily at 12 noon.   Fish Oil 1000 MG Cpdr Take 1,000 mg by mouth daily. And then increase to 3000 mg daily after 3 weeks   folic acid 1 MG tablet Commonly known as: FOLVITE Take 1 tablet (1 mg total) by mouth daily. Start taking on: May 09, 2023   gabapentin 600 MG  tablet Commonly known as: NEURONTIN Take 0.5 tablet by mouth in the morning then take a full tablet by mouth at bedtime.   guaiFENesin 600 MG 12 hr tablet Commonly known as: MUCINEX Take 2 tablets (1,200 mg total) by mouth 2 (two) times daily for 5 days.   HYDROcodone bit-homatropine 5-1.5 MG/5ML syrup Commonly known as: HYCODAN Take 5 mLs by mouth every 6 (six) hours as needed for cough.   meloxicam 15 MG tablet Commonly known as: MOBIC Take 1 tablet (15 mg total) by mouth daily.   metFORMIN 1000 MG tablet Commonly known as: GLUCOPHAGE Take 1 tablet (1,000 mg total) by mouth 2 (two) times daily with a meal.   multivitamin with minerals Tabs tablet Take 1 tablet by mouth daily. Start taking on: May 09, 2023   nicotine 21 mg/24hr patch Commonly known as: NICODERM CQ - dosed in mg/24 hours Place 1 patch (21 mg total) onto the skin daily. Start taking on: May 09, 2023   ondansetron 4 MG tablet Commonly known as: ZOFRAN Take 1 tablet (4 mg total) by mouth every 6 (six) hours as needed for nausea.   sertraline 100 MG tablet Commonly known as: Zoloft Take 1 tablet (100 mg total) by mouth daily.   sitaGLIPtin 100 MG tablet Commonly known as: Januvia Take 1 tablet (100 mg total) by mouth daily.   thiamine  100 MG tablet Commonly known as: Vitamin B-1 Take 1 tablet (100 mg total) by mouth daily. Start taking on: May 09, 2023   Trelegy Ellipta 100-62.5-25 MCG/ACT Aepb Generic drug: Fluticasone-Umeclidin-Vilant Inhale 1 Inhalation into the lungs daily.        Discharge Assessment: Vitals:   05/08/23 1234 05/08/23 1426  BP: 124/77   Pulse: 72   Resp: 18   Temp: 98.4 F (36.9 C)   SpO2: 97% 94%   Skin clean, dry and intact without evidence of skin break down, no evidence of skin tears noted. IV catheter discontinued intact. Site without signs and symptoms of complications - no redness or edema noted at insertion site, patient denies c/o pain - only  slight tenderness at site.  Dressing with slight pressure applied.  D/c Instructions-Education: Discharge instructions given to patient/family with verbalized understanding. D/c education completed with patient/family including follow up instructions, medication list, d/c activities limitations if indicated, with other d/c instructions as indicated by MD - patient able to verbalize understanding, all questions fully answered. Patient instructed to return to ED, call 911, or call MD for any changes in condition.  Patient escorted via WC, and D/C home via private auto.  Demetrio Lapping, LPN [id number]/5284 1:32 PM

## 2023-05-09 ENCOUNTER — Telehealth: Payer: Self-pay | Admitting: *Deleted

## 2023-05-09 NOTE — Transitions of Care (Post Inpatient/ED Visit) (Signed)
   05/09/2023  Name: Larry Floyd MRN: [id number] DOB: 28-May-1960  Today's TOC FU Call Status: Today's TOC FU Call Status:: Unsuccessful Call (1st Attempt) Unsuccessful Call (1st Attempt) Date: 05/09/23  Attempted to reach the patient regarding the most recent Inpatient/ED visit.  Follow Up Plan: Additional outreach attempts will be made to reach the patient to complete the Transitions of Care (Post Inpatient/ED visit) call.   Irving Shows [facility], BSN RN Care Manager/ Transition of Care [place]/ [facility] [phone]

## 2023-05-10 ENCOUNTER — Telehealth: Payer: Self-pay | Admitting: *Deleted

## 2023-05-10 LAB — LEGIONELLA PNEUMOPHILA SEROGP 1 UR AG: L. pneumophila Serogp 1 Ur Ag: NEGATIVE

## 2023-05-10 NOTE — Transitions of Care (Post Inpatient/ED Visit) (Signed)
   05/10/2023  Name: JAMEN ABBONDANZA MRN: [id number] DOB: 05-Jul-1960  Today's TOC FU Call Status: Today's TOC FU Call Status:: Unsuccessful Call (2nd Attempt) Unsuccessful Call (2nd Attempt) Date: 05/10/23  Attempted to reach the patient regarding the most recent Inpatient/ED visit.  Follow Up Plan: Additional outreach attempts will be made to reach the patient to complete the Transitions of Care (Post Inpatient/ED visit) call.   Irving Shows [facility], BSN RN Care Manager/ Transition of Care [place]/ [facility] [phone]

## 2023-05-16 ENCOUNTER — Telehealth: Payer: Self-pay | Admitting: *Deleted

## 2023-05-16 NOTE — Transitions of Care (Post Inpatient/ED Visit) (Signed)
   05/16/2023  Name: Larry Floyd MRN: [id number] DOB: 1960/06/28  Today's TOC FU Call Status: Today's TOC FU Call Status:: Unsuccessful Call (3rd Attempt) Unsuccessful Call (3rd Attempt) Date: 05/16/23  Attempted to reach the patient regarding the most recent Inpatient/ED visit.  Follow Up Plan: No further outreach attempts will be made at this time. We have been unable to contact the patient.  Irving Shows [facility], BSN RN Care Manager/ Transition of Care [place]/ [facility] [phone]

## 2023-06-02 ENCOUNTER — Encounter: Payer: Self-pay | Admitting: *Deleted

## 2023-06-07 ENCOUNTER — Ambulatory Visit: Payer: No Typology Code available for payment source | Admitting: Cardiology

## 2023-06-10 ENCOUNTER — Ambulatory Visit (INDEPENDENT_AMBULATORY_CARE_PROVIDER_SITE_OTHER): Payer: No Typology Code available for payment source

## 2023-06-10 ENCOUNTER — Encounter: Payer: Self-pay | Admitting: Family Medicine

## 2023-06-10 ENCOUNTER — Ambulatory Visit: Payer: No Typology Code available for payment source | Admitting: Family Medicine

## 2023-06-10 VITALS — BP 154/70 | HR 81 | Temp 98.5°F | Ht 67.0 in | Wt 139.0 lb

## 2023-06-10 DIAGNOSIS — J189 Pneumonia, unspecified organism: Secondary | ICD-10-CM | POA: Diagnosis not present

## 2023-06-10 DIAGNOSIS — F1090 Alcohol use, unspecified, uncomplicated: Secondary | ICD-10-CM

## 2023-06-10 DIAGNOSIS — R600 Localized edema: Secondary | ICD-10-CM

## 2023-06-10 NOTE — Progress Notes (Signed)
Subjective:  Patient ID: Larry Floyd, male    DOB: Apr 10, 1961, 63 y.o.   MRN: [id number]  Patient Care Team: Gabriel Earing, FNP as PCP - General (Family Medicine) Marykay Lex, MD as PCP - Cardiology (Cardiology)   Chief Complaint:  Foot Swelling (X 2 days)   HPI: Larry Floyd is a 63 y.o. male presenting on 06/10/2023 for Foot Swelling (X 2 days)  States that he has had swelling in his bilateral feet that started 2 days ago. States that he is soaking and elevating them. Does not believe that it is really helping. In addition, he reports that he has increased shortness of breath.   In addition, [facility] was [facility]-05/08/2023 for alcohol induced acute pancreatitis, complicated by aspiration pneumonia vs CAP and hyponatremia.  Patient treated with aggressive IV rehydration, liquid diet with advancement at tolerated, and farxiga was discontinued. It was not resumed at discharge. CT of abdomen and pelvis revealed infiltration at the right lung base that was concerning for aspiration pneumonia vs CAP. Patient was treated with antibiotics. Legionella antigen was pending at time of discharge. Discharged home on oral Augmentin. States that since coming home he is doing okay overall. Reports that he is trying to cut back on alcohol use. Reports that if he goes a few days without it, he begins to tremor and then will drink to reduce the tremors. Reports that he is drinking 1-2 times per week. Drinks 1 pint of Vodka when he drinks. Of note, he has follow up with cardiology on March 31st.    Legionella negative in hosptia.   Relevant past medical, surgical, family, and social history reviewed and updated as indicated.  Allergies and medications reviewed and updated. Data reviewed: Chart in Epic.   Past Medical History:  Diagnosis Date   Arthritis    Bronchitis    Cataract    Diabetes mellitus without complication (HCC)    Emphysema lung (HCC)    GERD  (gastroesophageal reflux disease)    Hypercholesteremia    Hypertension     Past Surgical History:  Procedure Laterality Date   BACK SURGERY     Lumbar   CORONARY PRESSURE/FFR STUDY N/A 11/25/2021   Procedure: INTRAVASCULAR PRESSURE WIRE/FFR STUDY;  Surgeon: Marykay Lex, MD;  [facility]: [facility] INVAS[facility] CV LAB;  Service: Cardiovascular;  Laterality: N/A;   EYE SURGERY Bilateral    implant insertion   HERNIA REPAIR     x2   LEFT HEART CATH AND CORONARY ANGIOGRAPHY N/A 11/25/2021   Procedure: LEFT HEART CATH AND CORONARY ANGIOGRAPHY;  Surgeon: Marykay Lex, MD;  Location: [facility] INVASIVE CV LAB::   Angiographically Mod 1 V CAD w/ Dist LCx 60% - RFR NEGATIVE lesion.   Prox RCA 20%.  Mid RCA 10%.  Prox LAD to Mid LAD 15% . Normal LV function-EF~50 to 55%, with mildly elevated LVEDP. LV- gram suggest possible anterior wall motion abnormality.  REC: TTE to better assess.    Social History   Socioeconomic History   Marital status: Married    Spouse name: Burna Mortimer   Number of children: 1   Years of education: 7   Highest education level: 7th grade  Occupational History   Not on file  Tobacco Use   Smoking status: Every Day    Current packs/day: 3.00    Average packs/day: 3.0 packs/day for 45.0 years (135.0 ttl pk-yrs)    Types: Cigarettes   Smokeless tobacco: Never  Tobacco comments:    Started smoking at 16  Vaping Use   Vaping status: Never Used  Substance and Sexual Activity   Alcohol use: Yes    Alcohol/week: 5.0 standard drinks of alcohol    Types: 5 Shots of liquor per week    Comment: drinks "a couple shots" one night a week   Drug use: No   Sexual activity: Yes    Birth control/protection: None  Other Topics Concern   Not on file  Social History Narrative   [place] 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   Still smokes.   Social Drivers of Corporate investment banker Strain: Not on file  Food Insecurity: No Food Insecurity (05/07/2023)   Hunger  Vital Sign    Worried About Running Out of Food in the Last Year: Never true    Ran Out of Food in the Last Year: Never true  Transportation Needs: No Transportation Needs (05/07/2023)   PRAPARE - Administrator, Civil Service (Medical): No    Lack of Transportation (Non-Medical): No  Physical Activity: Not on file  Stress: Not on file  Social Connections: Not on file  Intimate Partner Violence: Not At Risk (05/07/2023)   Humiliation, Afraid, Rape, and Kick questionnaire    Fear of Current or Ex-Partner: No    Emotionally Abused: No    Physically Abused: No    Sexually Abused: No    Outpatient Encounter Medications as of 06/10/2023  Medication Sig   acetaminophen (TYLENOL) 325 MG tablet Take 2 tablets (650 mg total) by mouth every 6 (six) hours as needed for mild pain (pain score 1-3) (or Fever >/= 101).   albuterol (VENTOLIN HFA) 108 (90 Base) MCG/ACT inhaler Inhale 2 puffs into the lungs 3 (three) times daily for 5 days, THEN 2 puffs every 6 (six) hours as needed for wheezing or shortness of breath.   amLODipine (NORVASC) 2.5 MG tablet Take 1 tablet (2.5 mg total) by mouth daily.   atorvastatin (LIPITOR) 80 MG tablet Take 1 tablet (80 mg total) by mouth daily.   bisoprolol (ZEBETA) 5 MG tablet Take 1 tablet by mouth once daily   cetirizine (ZYRTEC) 10 MG tablet Take 0.5 tablets (5 mg total) by mouth daily. (Patient taking differently: Take 10 mg by mouth daily.)   esomeprazole (NEXIUM) 40 MG capsule Take 1 capsule (40 mg total) by mouth daily at 12 noon.   Fluticasone-Umeclidin-Vilant (TRELEGY ELLIPTA) 100-62.5-25 MCG/ACT AEPB Inhale 1 Inhalation into the lungs daily.   folic acid (FOLVITE) 1 MG tablet Take 1 tablet (1 mg total) by mouth daily.   gabapentin (NEURONTIN) 600 MG tablet Take 0.5 tablet by mouth in the morning then take a full tablet by mouth at bedtime.   HYDROcodone bit-homatropine (HYCODAN) 5-1.5 MG/5ML syrup Take 5 mLs by mouth every 6 (six) hours as needed  for cough.   meloxicam (MOBIC) 15 MG tablet Take 1 tablet (15 mg total) by mouth daily.   metFORMIN (GLUCOPHAGE) 1000 MG tablet Take 1 tablet (1,000 mg total) by mouth 2 (two) times daily with a meal.   Multiple Vitamin (MULTIVITAMIN WITH MINERALS) TABS tablet Take 1 tablet by mouth daily.   nicotine (NICODERM CQ - DOSED IN MG/24 HOURS) 21 mg/24hr patch Place 1 patch (21 mg total) onto the skin daily.   Omega-3 Fatty Acids (FISH OIL) 1000 MG CPDR Take 1,000 mg by mouth daily. And then increase to 3000 mg daily after 3 weeks  ondansetron (ZOFRAN) 4 MG tablet Take 1 tablet (4 mg total) by mouth every 6 (six) hours as needed for nausea.   sertraline (ZOLOFT) 100 MG tablet Take 1 tablet (100 mg total) by mouth daily.   sitaGLIPtin (JANUVIA) 100 MG tablet Take 1 tablet (100 mg total) by mouth daily.   thiamine (VITAMIN B-1) 100 MG tablet Take 1 tablet (100 mg total) by mouth daily.   No facility-administered encounter medications on file as of 06/10/2023.    No Known Allergies  Review of Systems  Objective:  BP (!) 154/70   Pulse 81   Temp 98.5 F (36.9 C)   Ht 5\' 7"  (1.702 m)   Wt 139 lb (63 kg)   SpO2 97%   BMI 21.77 kg/m    Wt Readings from Last 3 Encounters:  06/10/23 139 lb (63 kg)  05/07/23 135 lb 12.9 oz (61.6 kg)  04/25/23 137 lb 8 oz (62.4 kg)    Physical Exam Constitutional:      General: He is awake. He is not in acute distress.    Appearance: Normal appearance. He is well-developed and well-groomed. He is ill-appearing. He is not toxic-appearing or diaphoretic.     Comments: Chronically ill-appearing   Cardiovascular:     Rate and Rhythm: Normal rate and regular rhythm.     Pulses: Normal pulses.          Radial pulses are 2+ on the right side and 2+ on the left side.       Posterior tibial pulses are 2+ on the right side and 2+ on the left side.     Heart sounds: Normal heart sounds. No murmur heard.    No gallop.     Comments: Trace bilateral pedal edema   Pulmonary:     Effort: Pulmonary effort is normal. No respiratory distress.     Breath sounds: Normal breath sounds. No stridor. No wheezing, rhonchi or rales.  Musculoskeletal:     Cervical back: Full passive range of motion without pain and neck supple.     Right lower leg: Edema present.     Left lower leg: Edema present.  Skin:    General: Skin is warm.     Capillary Refill: Capillary refill takes less than 2 seconds.  Neurological:     General: No focal deficit present.     Mental Status: He is alert, oriented to person, place, and time and easily aroused. Mental status is at baseline.     GCS: GCS eye subscore is 4. GCS verbal subscore is 5. GCS motor subscore is 6.     Motor: Tremor present. No weakness.  Psychiatric:        Attention and Perception: Attention and perception normal.        Mood and Affect: Mood and affect normal.        Speech: Speech normal.        Behavior: Behavior normal. Behavior is cooperative.        Thought Content: Thought content normal. Thought content does not include homicidal or suicidal ideation. Thought content does not include homicidal or suicidal plan.        Cognition and Memory: Cognition and memory normal.        Judgment: Judgment normal.     Results for orders placed or performed during the hospital encounter of 05/06/23  Urinalysis, Routine w reflex microscopic -Urine, Clean Catch   Collection Time: 05/06/23  5:05 PM  Result Value Ref Range  Color, Urine YELLOW YELLOW   APPearance CLEAR CLEAR   Specific Gravity, Urine 1.021 1.005 - 1.030   pH 5.0 5.0 - 8.0   Glucose, UA 150 (A) NEGATIVE mg/dL   Hgb urine dipstick NEGATIVE NEGATIVE   Bilirubin Urine NEGATIVE NEGATIVE   Ketones, ur NEGATIVE NEGATIVE mg/dL   Protein, ur 562 (A) NEGATIVE mg/dL   Nitrite NEGATIVE NEGATIVE   Leukocytes,Ua NEGATIVE NEGATIVE   RBC / HPF 0-5 0 - 5 RBC/hpf   WBC, UA 0-5 0 - 5 WBC/hpf   Bacteria, UA NONE SEEN NONE SEEN   Squamous Epithelial / HPF  0-5 0 - 5 /HPF   Mucus PRESENT   Lipase, blood   Collection Time: 05/06/23  5:24 PM  Result Value Ref Range   Lipase 78 (H) 11 - 51 U/L  Comprehensive metabolic panel   Collection Time: 05/06/23  5:24 PM  Result Value Ref Range   Sodium 121 (L) 135 - 145 mmol/L   Potassium 4.3 3.5 - 5.1 mmol/L   Chloride 88 (L) 98 - 111 mmol/L   CO2 21 (L) 22 - 32 mmol/L   Glucose, Bld 165 (H) 70 - 99 mg/dL   BUN 14 8 - 23 mg/dL   Creatinine, Ser 1.30 0.61 - 1.24 mg/dL   Calcium 9.2 8.9 - 86.5 mg/dL   Total Protein 8.0 6.5 - 8.1 g/dL   Albumin 3.9 3.5 - 5.0 g/dL   AST 17 15 - 41 U/L   ALT 22 0 - 44 U/L   Alkaline Phosphatase 120 38 - 126 U/L   Total Bilirubin <0.2 <1.2 mg/dL   GFR, Estimated >78 >46 mL/min   Anion gap 12 5 - 15  CBC   Collection Time: 05/06/23  5:24 PM  Result Value Ref Range   WBC 18.8 (H) 4.0 - 10.5 K/uL   RBC 4.80 4.22 - 5.81 MIL/uL   Hemoglobin 13.7 13.0 - 17.0 g/dL   HCT 96.2 95.2 - 84.1 %   MCV 84.6 80.0 - 100.0 fL   MCH 28.5 26.0 - 34.0 pg   MCHC 33.7 30.0 - 36.0 g/dL   RDW 32.4 40.1 - 02.7 %   Platelets 507 (H) 150 - 400 K/uL   nRBC 0.0 0.0 - 0.2 %  Urinalysis, Routine w reflex microscopic -Urine, Clean Catch   Collection Time: 05/06/23 11:51 PM  Result Value Ref Range   Color, Urine COLORLESS (A) YELLOW   APPearance CLEAR CLEAR   Specific Gravity, Urine 1.026 1.005 - 1.030   pH 6.0 5.0 - 8.0   Glucose, UA NEGATIVE NEGATIVE mg/dL   Hgb urine dipstick NEGATIVE NEGATIVE   Bilirubin Urine NEGATIVE NEGATIVE   Ketones, ur NEGATIVE NEGATIVE mg/dL   Protein, ur NEGATIVE NEGATIVE mg/dL   Nitrite NEGATIVE NEGATIVE   Leukocytes,Ua NEGATIVE NEGATIVE  Sodium, urine, random   Collection Time: 05/06/23 11:51 PM  Result Value Ref Range   Sodium, Ur 18 mmol/L  Osmolality, urine   Collection Time: 05/06/23 11:51 PM  Result Value Ref Range   Osmolality, Ur 185 (L) 300 - 900 mOsm/kg  Legionella Pneumophila Serogp 1 Ur Ag   Collection Time: 05/06/23 11:51 PM  Result  Value Ref Range   L. pneumophila Serogp 1 Ur Ag Negative Negative   Source of Sample URINE, CLEAN CATCH   Strep pneumoniae urinary antigen   Collection Time: 05/06/23 11:51 PM  Result Value Ref Range   Strep Pneumo Urinary Antigen NEGATIVE NEGATIVE  Osmolality   Collection Time: 05/07/23  7:23 AM  Result Value Ref Range   Osmolality 283 275 - 295 mOsm/kg  CBC   Collection Time: 05/07/23  7:23 AM  Result Value Ref Range   WBC 11.4 (H) 4.0 - 10.5 K/uL   RBC 4.73 4.22 - 5.81 MIL/uL   Hemoglobin 13.7 13.0 - 17.0 g/dL   HCT 16.1 09.6 - 04.5 %   MCV 84.6 80.0 - 100.0 fL   MCH 29.0 26.0 - 34.0 pg   MCHC 34.3 30.0 - 36.0 g/dL   RDW 40.9 81.1 - 91.4 %   Platelets 504 (H) 150 - 400 K/uL   nRBC 0.0 0.0 - 0.2 %  Hemoglobin A1c   Collection Time: 05/07/23  7:23 AM  Result Value Ref Range   Hgb A1c MFr Bld 7.1 (H) 4.8 - 5.6 %   Mean Plasma Glucose 157.07 mg/dL  CBG monitoring, ED   Collection Time: 05/07/23  7:41 AM  Result Value Ref Range   Glucose-Capillary 137 (H) 70 - 99 mg/dL  HIV Antibody (routine testing w rflx)   Collection Time: 05/07/23  8:28 AM  Result Value Ref Range   HIV Screen 4th Generation wRfx Non Reactive Non Reactive  Basic metabolic panel   Collection Time: 05/07/23 11:27 AM  Result Value Ref Range   Sodium 131 (L) 135 - 145 mmol/L   Potassium 4.3 3.5 - 5.1 mmol/L   Chloride 101 98 - 111 mmol/L   CO2 22 22 - 32 mmol/L   Glucose, Bld 105 (H) 70 - 99 mg/dL   BUN 9 8 - 23 mg/dL   Creatinine, Ser 7.82 (L) 0.61 - 1.24 mg/dL   Calcium 8.0 (L) 8.9 - 10.3 mg/dL   GFR, Estimated >95 >62 mL/min   Anion gap 8 5 - 15  TSH   Collection Time: 05/07/23 11:27 AM  Result Value Ref Range   TSH 2.256 0.350 - 4.500 uIU/mL  Cortisol   Collection Time: 05/07/23 11:27 AM  Result Value Ref Range   Cortisol, Plasma 9.6 ug/dL  CBG monitoring, ED   Collection Time: 05/07/23 12:50 PM  Result Value Ref Range   Glucose-Capillary 163 (H) 70 - 99 mg/dL  Glucose, capillary    Collection Time: 05/07/23  5:13 PM  Result Value Ref Range   Glucose-Capillary 90 70 - 99 mg/dL   Comment 1 Notify RN    Comment 2 Document in Chart   Glucose, capillary   Collection Time: 05/07/23  9:48 PM  Result Value Ref Range   Glucose-Capillary 190 (H) 70 - 99 mg/dL   Comment 1 Notify RN    Comment 2 Document in Chart   Comprehensive metabolic panel   Collection Time: 05/08/23  5:31 AM  Result Value Ref Range   Sodium 134 (L) 135 - 145 mmol/L   Potassium 4.1 3.5 - 5.1 mmol/L   Chloride 101 98 - 111 mmol/L   CO2 23 22 - 32 mmol/L   Glucose, Bld 139 (H) 70 - 99 mg/dL   BUN 5 (L) 8 - 23 mg/dL   Creatinine, Ser 1.30 (L) 0.61 - 1.24 mg/dL   Calcium 9.3 8.9 - 86.5 mg/dL   Total Protein 7.4 6.5 - 8.1 g/dL   Albumin 3.5 3.5 - 5.0 g/dL   AST 15 15 - 41 U/L   ALT 19 0 - 44 U/L   Alkaline Phosphatase 112 38 - 126 U/L   Total Bilirubin 0.4 <1.2 mg/dL   GFR, Estimated >78 >46 mL/min   Anion gap 10  5 - 15  CBC   Collection Time: 05/08/23  5:31 AM  Result Value Ref Range   WBC 12.2 (H) 4.0 - 10.5 K/uL   RBC 4.55 4.22 - 5.81 MIL/uL   Hemoglobin 13.0 13.0 - 17.0 g/dL   HCT 40.9 81.1 - 91.4 %   MCV 86.8 80.0 - 100.0 fL   MCH 28.6 26.0 - 34.0 pg   MCHC 32.9 30.0 - 36.0 g/dL   RDW 78.2 (H) 95.6 - 21.3 %   Platelets 471 (H) 150 - 400 K/uL   nRBC 0.0 0.0 - 0.2 %  Magnesium   Collection Time: 05/08/23  5:31 AM  Result Value Ref Range   Magnesium 1.8 1.7 - 2.4 mg/dL  Phosphorus   Collection Time: 05/08/23  5:31 AM  Result Value Ref Range   Phosphorus 3.1 2.5 - 4.6 mg/dL  Lipase, blood   Collection Time: 05/08/23  5:31 AM  Result Value Ref Range   Lipase 53 (H) 11 - 51 U/L  Lipid panel   Collection Time: 05/08/23  5:31 AM  Result Value Ref Range   Cholesterol 112 0 - 200 mg/dL   Triglycerides 086 <578 mg/dL   HDL 36 (L) >46 mg/dL   Total CHOL/HDL Ratio 3.1 RATIO   VLDL 21 0 - 40 mg/dL   LDL Cholesterol 55 0 - 99 mg/dL  Glucose, capillary   Collection Time: 05/08/23  7:19  AM  Result Value Ref Range   Glucose-Capillary 145 (H) 70 - 99 mg/dL  Glucose, capillary   Collection Time: 05/08/23 11:08 AM  Result Value Ref Range   Glucose-Capillary 169 (H) 70 - 99 mg/dL  Glucose, capillary   Collection Time: 05/08/23  4:27 PM  Result Value Ref Range   Glucose-Capillary 194 (H) 70 - 99 mg/dL       9/62/9528   41:32 AM 04/25/2023    3:12 PM 03/23/2023    2:19 PM 12/20/2022    2:06 PM 09/08/2022    2:59 PM  Depression screen PHQ 2/9  Decreased Interest 0 0 0 0 0  Down, Depressed, Hopeless 1 0 0 0 0  PHQ - 2 Score 1 0 0 0 0  Altered sleeping 1 0 0 0 0  Tired, decreased energy 1 0 0 3 0  Change in appetite 1 0 0 0 0  Feeling bad or failure about yourself  0 0 0 0 0  Trouble concentrating 0 0 0 0 0  Moving slowly or fidgety/restless 0 0 0 0 0  Suicidal thoughts 0 0 0 0 0  PHQ-9 Score 4 0 0 3 0  Difficult doing work/chores Somewhat difficult Not difficult at all Not difficult at all Not difficult at all Not difficult at all       06/10/2023   11:09 AM 04/25/2023    3:11 PM 03/23/2023    2:19 PM 12/20/2022    2:06 PM  GAD 7 : Generalized Anxiety Score  Nervous, Anxious, on Edge 2 0 0 0  Control/stop worrying 2 0 0 0  Worry too much - different things 2 0 0 0  Trouble relaxing 2 0 0 0  Restless 2 0 0 0  Easily annoyed or irritable 2 0 0 0  Afraid - awful might happen 0 0 0 0  Total GAD 7 Score 12 0 0 0  Anxiety Difficulty Somewhat difficult Not difficult at all Not difficult at all Not difficult at all   Pertinent labs & imaging results that were  available during my care of the patient were reviewed by me and considered in my medical decision making.  Assessment & Plan:  Purvis was seen today for foot swelling.  Diagnoses and all orders for this visit:  1. Bilateral lower extremity edema (Primary) Discussed with patient conservative measures to start. Reviewed cardiology note from [place], MD 12/14/2021. Reviewed Echo from 12/30/2021. Stockings ordered  as below. Encouraged ambulation, elevation and stockings. Patient to follow up with cardiology in march for further evaluation.  - Compression stockings  2. Alcohol use disorder Reviewed ED notes from admission on 05/06/23 by Thomes Dinning, DO  Reviewed ED notes from discharge on 05/08/23 by Janee Morn, MD Reviewed Legionella antigen on 05/06/23 - negative  Imaging as below to monitor for pneumonia.  Patient offered referral to psychiatry for additional management of alcohol cessation. Patient declined. Labs as below. Will communicate results to patient once available. Will await results to determine next steps.  - CMP14+EGFR - CBC with Differential/Platelet - Magnesium  3. Pneumonia due to infectious organism, unspecified laterality, unspecified part of lung As above.  - DG Chest 2 View; Future  Continue all other maintenance medications.  Follow up plan: Return if symptoms worsen or fail to improve.   Continue healthy lifestyle choices, including diet (rich in fruits, vegetables, and lean proteins, and low in salt and simple carbohydrates) and exercise (at least 30 minutes of moderate physical activity daily).  Written and verbal instructions provided   The above assessment and management plan was discussed with the patient. The patient verbalized understanding of and has agreed to the management plan. Patient is aware to call the clinic if they develop any new symptoms or if symptoms persist or worsen. Patient is aware when to return to the clinic for a follow-up visit. Patient educated on when it is appropriate to go to the emergency department.   Neale Burly, DNP-FNP [facility] [address] [place], [place] [address] [phone]

## 2023-06-10 NOTE — Progress Notes (Signed)
Resolved lower lobe opacity. Patient does not need further treatment for pneumonia. Please follow up with pulmonology and PCP for further management of emphysema

## 2023-06-10 NOTE — Patient Instructions (Signed)
[facility]  Phone:  [phone]  Address:  [address]  [place], [place] [address]  Hours:  Open 24/7, No appointment required.    [facility] is centrally located in the [place] at [address] [place]

## 2023-06-11 LAB — CMP14+EGFR
ALT: 25 IU/L (ref 0–44)
AST: 23 IU/L (ref 0–40)
Albumin: 4.5 g/dL (ref 3.9–4.9)
Alkaline Phosphatase: 140 IU/L — ABNORMAL HIGH (ref 44–121)
BUN/Creatinine Ratio: 13 (ref 10–24)
BUN: 9 mg/dL (ref 8–27)
Bilirubin Total: 0.3 mg/dL (ref 0.0–1.2)
CO2: 23 mmol/L (ref 20–29)
Calcium: 9.5 mg/dL (ref 8.6–10.2)
Chloride: 97 mmol/L (ref 96–106)
Creatinine, Ser: 0.7 mg/dL — ABNORMAL LOW (ref 0.76–1.27)
Globulin, Total: 2.8 g/dL (ref 1.5–4.5)
Glucose: 137 mg/dL — ABNORMAL HIGH (ref 70–99)
Potassium: 4.7 mmol/L (ref 3.5–5.2)
Sodium: 135 mmol/L (ref 134–144)
Total Protein: 7.3 g/dL (ref 6.0–8.5)
eGFR: 104 mL/min/{1.73_m2} (ref >59)

## 2023-06-11 LAB — CBC WITH DIFFERENTIAL/PLATELET
Basophils Absolute: 0 10*3/uL (ref 0.0–0.2)
Basos: 0 %
EOS (ABSOLUTE): 0 10*3/uL (ref 0.0–0.4)
Eos: 0 %
Hematocrit: 37.1 % — ABNORMAL LOW (ref 37.5–51.0)
Hemoglobin: 12.1 g/dL — ABNORMAL LOW (ref 13.0–17.7)
Immature Grans (Abs): 0 10*3/uL (ref 0.0–0.1)
Immature Granulocytes: 0 %
Lymphocytes Absolute: 1.5 10*3/uL (ref 0.7–3.1)
Lymphs: 16 %
MCH: 28.9 pg (ref 26.6–33.0)
MCHC: 32.6 g/dL (ref 31.5–35.7)
MCV: 89 fL (ref 79–97)
Monocytes Absolute: 0.7 10*3/uL (ref 0.1–0.9)
Monocytes: 7 %
Neutrophils Absolute: 6.9 10*3/uL (ref 1.4–7.0)
Neutrophils: 77 %
Platelets: 281 10*3/uL (ref 150–450)
RBC: 4.18 x10E6/uL (ref 4.14–5.80)
RDW: 15.8 % — ABNORMAL HIGH (ref 11.6–15.4)
WBC: 9.2 10*3/uL (ref 3.4–10.8)

## 2023-06-11 LAB — MAGNESIUM: Magnesium: 1.7 mg/dL (ref 1.6–2.3)

## 2023-06-15 ENCOUNTER — Other Ambulatory Visit: Payer: Self-pay | Admitting: Cardiology

## 2023-06-15 ENCOUNTER — Encounter: Payer: Self-pay | Admitting: Family Medicine

## 2023-06-15 ENCOUNTER — Other Ambulatory Visit: Payer: Self-pay | Admitting: Family Medicine

## 2023-06-15 DIAGNOSIS — G8929 Other chronic pain: Secondary | ICD-10-CM

## 2023-06-15 NOTE — Progress Notes (Signed)
Slightly anemic. Elevated glucose consistent with elevated A1C from December. Please follow up with PCP for management of Diabetes. Slightly elevated Alk phos, consistent with previous.

## 2023-06-23 ENCOUNTER — Encounter: Payer: 59 | Admitting: Family Medicine

## 2023-06-30 ENCOUNTER — Ambulatory Visit: Payer: No Typology Code available for payment source

## 2023-07-08 ENCOUNTER — Other Ambulatory Visit: Payer: Self-pay | Admitting: Cardiology

## 2023-07-11 ENCOUNTER — Ambulatory Visit: Payer: No Typology Code available for payment source | Admitting: Family Medicine

## 2023-07-11 VITALS — BP 121/65 | HR 80 | Temp 98.5°F | Ht 67.0 in | Wt 144.4 lb

## 2023-07-11 DIAGNOSIS — F411 Generalized anxiety disorder: Secondary | ICD-10-CM

## 2023-07-11 DIAGNOSIS — F1090 Alcohol use, unspecified, uncomplicated: Secondary | ICD-10-CM | POA: Diagnosis not present

## 2023-07-11 DIAGNOSIS — F5104 Psychophysiologic insomnia: Secondary | ICD-10-CM

## 2023-07-11 DIAGNOSIS — Z0000 Encounter for general adult medical examination without abnormal findings: Secondary | ICD-10-CM

## 2023-07-11 DIAGNOSIS — R251 Tremor, unspecified: Secondary | ICD-10-CM

## 2023-07-11 DIAGNOSIS — Z0001 Encounter for general adult medical examination with abnormal findings: Secondary | ICD-10-CM | POA: Diagnosis not present

## 2023-07-11 DIAGNOSIS — F419 Anxiety disorder, unspecified: Secondary | ICD-10-CM

## 2023-07-11 DIAGNOSIS — R195 Other fecal abnormalities: Secondary | ICD-10-CM

## 2023-07-11 MED ORDER — SERTRALINE HCL 100 MG PO TABS: 150.0000 mg | ORAL_TABLET | Freq: Every day | ORAL | 3 refills | Status: AC

## 2023-07-11 NOTE — Patient Instructions (Signed)
 Health Maintenance, Male  Adopting a healthy lifestyle and getting preventive care are important in promoting health and wellness. Ask your health care provider about:  The right schedule for you to have regular tests and exams.  Things you can do on your own to prevent diseases and keep yourself healthy.  What should I know about diet, weight, and exercise?  Eat a healthy diet    Eat a diet that includes plenty of vegetables, fruits, low-fat dairy products, and lean protein.  Do not eat a lot of foods that are high in solid fats, added sugars, or sodium.  Maintain a healthy weight  Body mass index (BMI) is a measurement that can be used to identify possible weight problems. It estimates body fat based on height and weight. Your health care provider can help determine your BMI and help you achieve or maintain a healthy weight.  Get regular exercise  Get regular exercise. This is one of the most important things you can do for your health. Most adults should:  Exercise for at least 150 minutes each week. The exercise should increase your heart rate and make you sweat (moderate-intensity exercise).  Do strengthening exercises at least twice a week. This is in addition to the moderate-intensity exercise.  Spend less time sitting. Even light physical activity can be beneficial.  Watch cholesterol and blood lipids  Have your blood tested for lipids and cholesterol at 63 years of age, then have this test every 5 years.  You may need to have your cholesterol levels checked more often if:  Your lipid or cholesterol levels are high.  You are older than 63 years of age.  You are at high risk for heart disease.  What should I know about cancer screening?  Many types of cancers can be detected early and may often be prevented. Depending on your health history and family history, you may need to have cancer screening at various ages. This may include screening for:  Colorectal cancer.  Prostate cancer.  Skin cancer.  Lung  cancer.  What should I know about heart disease, diabetes, and high blood pressure?  Blood pressure and heart disease  High blood pressure causes heart disease and increases the risk of stroke. This is more likely to develop in people who have high blood pressure readings or are overweight.  Talk with your health care provider about your target blood pressure readings.  Have your blood pressure checked:  Every 3-5 years if you are 9-95 years of age.  Every year if you are 85 years old or older.  If you are between the ages of 29 and 29 and are a current or former smoker, ask your health care provider if you should have a one-time screening for abdominal aortic aneurysm (AAA).  Diabetes  Have regular diabetes screenings. This checks your fasting blood sugar level. Have the screening done:  Once every three years after age 23 if you are at a normal weight and have a low risk for diabetes.  More often and at a younger age if you are overweight or have a high risk for diabetes.  What should I know about preventing infection?  Hepatitis B  If you have a higher risk for hepatitis B, you should be screened for this virus. Talk with your health care provider to find out if you are at risk for hepatitis B infection.  Hepatitis C  Blood testing is recommended for:  Everyone born from [birth year] through 1965.  Anyone  with known risk factors for hepatitis C.  Sexually transmitted infections (STIs)  You should be screened each year for STIs, including gonorrhea and chlamydia, if:  You are sexually active and are younger than 62 years of age.  You are older than 63 years of age and your health care provider tells you that you are at risk for this type of infection.  Your sexual activity has changed since you were last screened, and you are at increased risk for chlamydia or gonorrhea. Ask your health care provider if you are at risk.  Ask your health care provider about whether you are at high risk for HIV. Your health care provider  may recommend a prescription medicine to help prevent HIV infection. If you choose to take medicine to prevent HIV, you should first get tested for HIV. You should then be tested every 3 months for as long as you are taking the medicine.  Follow these instructions at home:  Alcohol use  Do not drink alcohol if your health care provider tells you not to drink.  If you drink alcohol:  Limit how much you have to 0-2 drinks a day.  Know how much alcohol is in your drink. In the [place] one drink equals one 12 oz bottle of beer (355 mL), one 5 oz glass of wine (148 mL), or one 1 oz glass of hard liquor (44 mL).  Lifestyle  Do not use any products that contain nicotine or tobacco. These products include cigarettes, chewing tobacco, and vaping devices, such as e-cigarettes. If you need help quitting, ask your health care provider.  Do not use street drugs.  Do not share needles.  Ask your health care provider for help if you need support or information about quitting drugs.  General instructions  Schedule regular health, dental, and eye exams.  Stay current with your vaccines.  Tell your health care provider if:  You often feel depressed.  You have ever been abused or do not feel safe at home.  Summary  Adopting a healthy lifestyle and getting preventive care are important in promoting health and wellness.  Follow your health care provider's instructions about healthy diet, exercising, and getting tested or screened for diseases.  Follow your health care provider's instructions on monitoring your cholesterol and blood pressure.  This information is not intended to replace advice given to you by your health care provider. Make sure you discuss any questions you have with your health care provider.  Document Revised: 09/22/2020 Document Reviewed: 09/22/2020  Elsevier Patient Education  2024 [employer].

## 2023-07-11 NOTE — Progress Notes (Unsigned)
 Complete physical exam  Patient: Larry Floyd   DOB: November 27, 1960   63 y.o. Male  MRN: [id number]  Subjective:    Chief Complaint  Patient presents with   Annual Exam    Larry Floyd is a 63 y.o. male who presents today for a complete physical exam. He reports consuming a general diet. {types:19826} He generally feels {DESC; WELL/FAIRLY WELL/POORLY:18703}. He reports sleeping poorly. He {does/does not:200015} have additional problems to discuss today.   Sleeping 1 hour a night for last 6 months. He watches TV before bed. Drinks coffee and watches TV when he is unable to go back to sleep. Hasn't tried   Drinking 1 pints 1-2x a week.  Most recent fall risk assessment:    07/11/2023    3:24 PM  Fall Risk   Falls in the past year? 1  Number falls in past yr: 1  Injury with Fall? 1  Risk for fall due to : History of fall(s)  Follow up Falls evaluation completed     Most recent depression screenings:    07/11/2023    3:24 PM 06/10/2023   11:09 AM 04/25/2023    3:12 PM  Depression screen PHQ 2/9  Decreased Interest 0 0 0  Down, Depressed, Hopeless 0 1 0  PHQ - 2 Score 0 1 0  Altered sleeping 2 1 0  Tired, decreased energy 2 1 0  Change in appetite 1 1 0  Feeling bad or failure about yourself  0 0 0  Trouble concentrating 0 0 0  Moving slowly or fidgety/restless 0 0 0  Suicidal thoughts 0 0 0  PHQ-9 Score 5 4 0  Difficult doing work/chores Somewhat difficult Somewhat difficult Not difficult at all      07/11/2023    3:24 PM 06/10/2023   11:09 AM 04/25/2023    3:11 PM 03/23/2023    2:19 PM  GAD 7 : Generalized Anxiety Score  Nervous, Anxious, on Edge 2 2 0 0  Control/stop worrying 2 2 0 0  Worry too much - different things 2 2 0 0  Trouble relaxing 1 2 0 0  Restless 1 2 0 0  Easily annoyed or irritable 1 2 0 0  Afraid - awful might happen 0 0 0 0  Total GAD 7 Score 9 12 0 0  Anxiety Difficulty Somewhat difficult Somewhat difficult Not difficult at all Not  difficult at all      Vision:Within last year and Dental: No current dental problems and No regular dental care   {History (Optional):23778}  Patient Care Team: Gabriel Earing, FNP as PCP - General (Family Medicine) Marykay Lex, MD as PCP - Cardiology (Cardiology)   Outpatient Medications Prior to Visit  Medication Sig   acetaminophen (TYLENOL) 325 MG tablet Take 2 tablets (650 mg total) by mouth every 6 (six) hours as needed for mild pain (pain score 1-3) (or Fever >/= 101).   albuterol (VENTOLIN HFA) 108 (90 Base) MCG/ACT inhaler Inhale 2 puffs into the lungs 3 (three) times daily for 5 days, THEN 2 puffs every 6 (six) hours as needed for wheezing or shortness of breath.   amLODipine (NORVASC) 2.5 MG tablet Take 1 tablet (2.5 mg total) by mouth daily.   atorvastatin (LIPITOR) 80 MG tablet Take 1 tablet (80 mg total) by mouth daily.   bisoprolol (ZEBETA) 5 MG tablet Take 1 tablet (5 mg total) by mouth daily. PT. MUST KEEP UPCOMING APPOINTMENT IN ORDER TO RECEIVE ADDITIONAL  REFILLS.   cetirizine (ZYRTEC) 10 MG tablet Take 0.5 tablets (5 mg total) by mouth daily. (Patient taking differently: Take 10 mg by mouth daily.)   Fluticasone-Umeclidin-Vilant (TRELEGY ELLIPTA) 100-62.5-25 MCG/ACT AEPB Inhale 1 Inhalation into the lungs daily.   folic acid (FOLVITE) 1 MG tablet Take 1 tablet (1 mg total) by mouth daily.   gabapentin (NEURONTIN) 600 MG tablet Take 0.5 tablet by mouth in the morning then take a full tablet by mouth at bedtime.   meloxicam (MOBIC) 15 MG tablet Take 1 tablet by mouth once daily   metFORMIN (GLUCOPHAGE) 1000 MG tablet Take 1 tablet (1,000 mg total) by mouth 2 (two) times daily with a meal.   Multiple Vitamin (MULTIVITAMIN WITH MINERALS) TABS tablet Take 1 tablet by mouth daily.   nicotine (NICODERM CQ - DOSED IN MG/24 HOURS) 21 mg/24hr patch Place 1 patch (21 mg total) onto the skin daily.   Omega-3 Fatty Acids (FISH OIL) 1000 MG CPDR Take 1,000 mg by mouth daily.  And then increase to 3000 mg daily after 3 weeks   omeprazole (PRILOSEC) 40 MG capsule Take 40 mg by mouth daily.   ondansetron (ZOFRAN) 4 MG tablet Take 1 tablet (4 mg total) by mouth every 6 (six) hours as needed for nausea.   sertraline (ZOLOFT) 100 MG tablet Take 1 tablet (100 mg total) by mouth daily.   sitaGLIPtin (JANUVIA) 100 MG tablet Take 1 tablet (100 mg total) by mouth daily.   thiamine (VITAMIN B-1) 100 MG tablet Take 1 tablet (100 mg total) by mouth daily.   [DISCONTINUED] esomeprazole (NEXIUM) 40 MG capsule Take 1 capsule (40 mg total) by mouth daily at 12 noon.   [DISCONTINUED] HYDROcodone bit-homatropine (HYCODAN) 5-1.5 MG/5ML syrup Take 5 mLs by mouth every 6 (six) hours as needed for cough.   No facility-administered medications prior to visit.    ROS        Objective:     BP 121/65   Pulse 80   Temp 98.5 F (36.9 C) (Temporal)   Ht 5\' 7"  (1.702 m)   Wt 144 lb 6.4 oz (65.5 kg)   SpO2 93%   BMI 22.62 kg/m  Wt Readings from Last 3 Encounters:  07/11/23 144 lb 6.4 oz (65.5 kg)  06/10/23 139 lb (63 kg)  05/07/23 135 lb 12.9 oz (61.6 kg)      Physical Exam   No results found for any visits on 07/11/23. {Show previous labs (optional):23779}    Assessment & Plan:    Routine Health Maintenance and Physical Exam  Immunization History  Administered Date(s) Administered   Janssen (J&J) SARS-COV-2 Vaccination 10/09/2019   Tdap 10/14/2021   Zoster Recombinant(Shingrix) 05/04/2023    Health Maintenance  Topic Date Due   Colonoscopy  Never done   OPHTHALMOLOGY EXAM  10/23/2022   INFLUENZA VACCINE  08/15/2023 (Originally 12/16/2022)   Zoster Vaccines- Shingrix (2 of 2) 10/08/2023 (Originally 06/29/2023)   COLON CANCER SCREENING ANNUAL FOBT  01/08/2024 (Originally 04/26/2019)   COVID-19 Vaccine (2 - 2024-25 season) 04/07/2024 (Originally 01/16/2023)   Pneumococcal Vaccine 45-50 Years old (1 of 2 - PCV) 07/10/2024 (Originally 02/26/1967)   HEMOGLOBIN A1C   11/05/2023   Lung Cancer Screening  12/15/2023   Diabetic kidney evaluation - Urine ACR  03/22/2024   FOOT EXAM  03/22/2024   Diabetic kidney evaluation - eGFR measurement  06/09/2024   DTaP/Tdap/Td (2 - Td or Tdap) 10/15/2031   Hepatitis C Screening  Completed   HIV Screening  Completed  HPV VACCINES  Aged Out    Discussed health benefits of physical activity, and encouraged him to engage in regular exercise appropriate for his age and condition.  Problem List Items Addressed This Visit       Other   Positive colorectal cancer screening using Cologuard test - Primary   Other Visit Diagnoses       Generalized anxiety disorder         Chronic insomnia          No follow-ups on file.     Gabriel Earing, FNP

## 2023-07-15 ENCOUNTER — Encounter: Payer: Self-pay | Admitting: Family Medicine

## 2023-07-23 ENCOUNTER — Other Ambulatory Visit: Payer: Self-pay | Admitting: Family Medicine

## 2023-07-23 DIAGNOSIS — G8929 Other chronic pain: Secondary | ICD-10-CM

## 2023-07-27 ENCOUNTER — Encounter: Payer: Self-pay | Admitting: *Deleted

## 2023-08-02 ENCOUNTER — Ambulatory Visit: Payer: Self-pay | Admitting: Family Medicine
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
## 2023-08-03 ENCOUNTER — Ambulatory Visit: Admitting: Family Medicine

## 2023-08-03 ENCOUNTER — Encounter: Payer: Self-pay | Admitting: Family Medicine

## 2023-08-03 VITALS — BP 138/83 | HR 80 | Temp 98.2°F | Ht 67.0 in | Wt 145.0 lb

## 2023-08-03 DIAGNOSIS — E1159 Type 2 diabetes mellitus with other circulatory complications: Secondary | ICD-10-CM | POA: Diagnosis not present

## 2023-08-03 DIAGNOSIS — E1140 Type 2 diabetes mellitus with diabetic neuropathy, unspecified: Secondary | ICD-10-CM | POA: Diagnosis not present

## 2023-08-03 DIAGNOSIS — I152 Hypertension secondary to endocrine disorders: Secondary | ICD-10-CM

## 2023-08-03 DIAGNOSIS — T23201A Burn of second degree of right hand, unspecified site, initial encounter: Secondary | ICD-10-CM

## 2023-08-03 MED ORDER — SILVER SULFADIAZINE 1 % EX CREA: 1.0000 | TOPICAL_CREAM | Freq: Two times a day (BID) | CUTANEOUS | 0 refills | Status: AC
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
## 2023-08-03 NOTE — Patient Instructions (Signed)
 Burn Care, Adult A burn is an injury to the skin or the tissues under the skin. It may be caused by a fire, hot liquid or steam, chemicals, electricity, or the sun. There are three types of burns: First degree. These burns are similar to a sunburn. They may cause your skin to be red, slightly swollen, and tender. They may be treated at home. Second degree. These burns are very painful. They may cause your skin to turn very red, swell, leak fluid, look shiny, and blister. In many cases, these burns may be treated at home. If they cover your hands, feet, face, or genitals, get help from a health care provider. Third degree. These burns are the most severe. They may not be painful, but you may feel pain around the edges of them. Your skin may turn white or black and may look charred, dry, and leathery. These burns cause lasting damage. If you get a third-degree burn, get help right away. Treatment will depend on the type of burn you have. Taking care of your burn can help to prevent pain and infection. It can also help the burn heal more quickly. How to care for a first-degree burn Right after the burn: Rinse or soak the burn under cool water for 5 minutes or more. Put a cool, wet cloth (cool compress) on your skin. This may help with pain. Do not put ice on your burn. This can cause more damage. Caring for the burn Clean and care for the burn as told by your provider. You may be told to: Use mild soap and water to clean the area. Use a clean cloth to pat the burned area dry after cleaning it. Do not rub or scrub the burn. Put lotion or aloe vera gel on your skin. How to care for a second-degree burn Right after the burn: Rinse or soak the burn under cool water. Do this for 5-10 minutes. Do not put ice on your burn. This can cause more damage. Take off any jewelry or clothing near the burn. Lightly cover the burn with a clean cloth. Caring for the burn Clean and care for the burn as told by your  provider. You may be told to: Clean or rinse out the burned area. Put a cream or ointment on the burn. You may need to use an antibiotic cream that has silver in it. This can kill bacteria. Place a germ-free (sterile) dressing over the burn. A dressing is a bandage that is put over a burn to help it heal. Raise (elevate) the injured area above the level of your heart while you are sitting or lying down. How to care for a third-degree burn Right after the burn: Lightly cover the burn with a clean, dry cloth. Get help right away. You may need to: Stay in the hospital. Have surgery to remove burned tissue or get a skin graft. Get fluids through an IV. Caring for the burn Clean and care for the burn as told by your provider. You may be told to: Clean or rinse out the burn. Put a cream or ointment on the burn. Put a sterile dressing in the burned area (packing). Put a sterile dressing over the burn. Use pressure (compression) dressings. Elevate the injured area above the level of your heart while you are sitting or lying down. Wear splints or immobilizers as told by your provider. Do exercises as told by your provider. Rest as told by your provider. Do not do sports or  other physical activities until your provider says that you can. How to prevent infection when caring for a burn  Take these steps to prevent infection and more damage to the tissue. Make sure you: Wash your hands with soap and water for at least 20 seconds before and after you care for your burn. If soap and water are not available, use hand sanitizer. Wear clean gloves as told by your provider. Do not put butter, oil, toothpaste, or other home remedies on the burn. Do not scratch or pick at the burn. Do not break any blisters. Do not peel the skin. Do not rub your burn, even when cleaning it. Check your burn every day for signs of infection. Check for: More redness, swelling, or pain. Warmth. Pus or a bad smell. Red  streaks around the burn. Follow these instructions at home Medicines Take over-the-counter and prescription medicines only as told by your provider. If you were prescribed antibiotics, take or apply them as told by your provider. Do not stop using the antibiotic even if you start to feel better. General instructions Do not use any products that contain nicotine or tobacco. These products include cigarettes, chewing tobacco, and vaping devices, such as e-cigarettes. If you need help quitting, ask your provider. Drink enough fluid to keep your pee (urine) pale yellow. Protect your burn from the sun. Contact a health care provider if: Your burn does not get better, or it gets worse. You have any signs of infection. Your burn starts to look different or gets black or red spots. Your pain does not get better with medicine. You have anxiety or depression after the injury. Get help right away if: You have red streaks near the burn. You are in severe pain. This information is not intended to replace advice given to you by your health care provider. Make sure you discuss any questions you have with your health care provider. Document Revised: 05/20/2022 Document Reviewed: 05/19/2022 Elsevier Patient Education  2024 [employer].

## 2023-08-03 NOTE — Telephone Encounter (Signed)
 Pt scheduled to see PCP at 4:15 today.

## 2023-08-05 ENCOUNTER — Ambulatory Visit: Admitting: Gastroenterology

## 2023-08-10 ENCOUNTER — Ambulatory Visit: Admitting: Family Medicine

## 2023-08-15 ENCOUNTER — Encounter: Payer: Self-pay | Admitting: Cardiology

## 2023-08-15 ENCOUNTER — Ambulatory Visit: Payer: Self-pay | Attending: Cardiology | Admitting: Cardiology

## 2023-08-15 VITALS — BP 138/78 | HR 76 | Ht 67.0 in | Wt 143.6 lb

## 2023-08-15 DIAGNOSIS — R0789 Other chest pain: Secondary | ICD-10-CM

## 2023-08-15 DIAGNOSIS — E785 Hyperlipidemia, unspecified: Secondary | ICD-10-CM

## 2023-08-15 DIAGNOSIS — I10 Essential (primary) hypertension: Secondary | ICD-10-CM

## 2023-08-15 DIAGNOSIS — R0609 Other forms of dyspnea: Secondary | ICD-10-CM

## 2023-08-15 DIAGNOSIS — E1169 Type 2 diabetes mellitus with other specified complication: Secondary | ICD-10-CM

## 2023-08-15 DIAGNOSIS — F17200 Nicotine dependence, unspecified, uncomplicated: Secondary | ICD-10-CM

## 2023-08-15 DIAGNOSIS — I2510 Atherosclerotic heart disease of native coronary artery without angina pectoris: Secondary | ICD-10-CM | POA: Diagnosis not present

## 2023-08-15 MED ORDER — CHLORTHALIDONE 25 MG PO TABS: 25.0000 mg | ORAL_TABLET | Freq: Every day | ORAL | 3 refills | Status: DC

## 2023-08-15 NOTE — Progress Notes (Signed)
 Cardiology Office Note:  .   Date:  08/15/2023  ID:  Larry Floyd, DOB 30-Sep-1960, MRN [id number] PCP: Gabriel Earing, FNP   HeartCare Providers Cardiologist:  Bryan Lemma, MD     Chief Complaint  Patient presents with   Follow-up    Symptoms of shortness of breath, dyspnea, PND orthopnea consistent    Patient Profile: Larry Floyd Kitchen     Larry Floyd is a  63 y.o. male current smoker with a PMH notable for nonocclusive CAD, HTN, HLD who presents here for 67-month follow-up at the request of Gabriel Earing, FNP.     Larry Floyd was last seen on December 14, 2021 following cardiac catheterization => still noted dyspnea.  2D echo ordered because of abnormal wall motion on LV gram..   Subjective  Discussed the use of AI scribe software for clinical note transcription with the patient, who gave verbal consent to proceed.  History of Present Illness   Larry Floyd is a 63 year old male with hypertension and coronary artery disease who presents with swelling and shortness of breath. He is accompanied by his daughter and sister-in-law.  He experiences shortness of breath, particularly when lying flat, which sometimes causes him to wake up feeling like he is 'smothering and can't get air.' He uses two pillows to sleep and cannot lay flat due to shortness of breath. He also experiences shortness of breath with exertion, although it does not occur consistently with activity. Occasional chest pain is noted but is not consistently associated with exertion.  He reports significant swelling in his ankles, which fluctuates in severity. The swelling sometimes subsides when he elevates his feet, but at other times it persists for a few days. He has been soaking his feet in warm water and salt to alleviate the swelling.  He has a history of a heart catheterization performed two years ago, which showed moderate findings.  His cholesterol levels have been well-controlled with medication,  and his recent lab work in December showed a total cholesterol of 112, HDL of 36, LDL of 55, and triglycerides of 105. His hemoglobin A1c was 7.1, and his kidney function was normal with a creatinine of 0.7.  He has been smoking since he was 63 years old and has attempted to quit using nicotine patches, which were ineffective. He has since resumed smoking.  His current medications include amlodipine 2.5 mg daily, bisoprolol 5 mg daily, Lipitor 80 mg daily, metformin 1000 mg twice daily, Januvia 100 mg daily, Trelegy inhaler, Prilosec, Zofran as needed, Zyrtec, Zoloft, and Neurontin 600 mg (half tablet in the morning and evening).      Cardiovascular ROS: positive for - chest pain, dyspnea on exertion, edema, orthopnea, paroxysmal nocturnal dyspnea, and this is not new. negative for - irregular heartbeat, palpitations, rapid heart rate, shortness of breath, or syncope or near syncope or TIA/CVA/amaurosis fugax, claudication    Objective   Pertinent Medications - Amlodipine 2.5 mg daily; - Bisoprolol 5 mg daily - Lipitor 80 mg daily - Fish oil - Metformin 1000 mg twice a day - Januvia 100 mg daily - Trelegy 100-62.5-25  - Neurontin 600 mg half tablet in the morning and half tablet in the evening - Nicotine patches  Studies Reviewed: Larry Floyd Kitchen        LABS Total cholesterol: 112 mg/dL (40/98/1191) HDL: 36 mg/dL (47/82/9562) LDL: 55 mg/dL (13/12/6576) Triglycerides: 105 mg/dL (46/96/2952) WUX3K: 4.4% (05/08/2023) Hemoglobin: 12.1 g/dL (05/270) Creatinine: 0.7 mg/dL (53/6644) Potassium: 4.7  mmol/L (05/2023) Magnesium: 1.7 mg/dL (40/9811) TSH: 9.14 mIU/L (05/2023)  DIAGNOSTIC Cardiac Cath 11/25/2021: Proximal mid LAD 15%, proximal RCA 20% and mid RCA 10%.  Distal LCx 60%-(FFR negative).  EF 50 to 55%. ECHO 12/2021: Normal LVEF 55 to 60%.  No RWMA.  Normal valves.  Normal RAP. Zio patch monitor November 2024: Mostly sinus rhythm with rate range 55 to 144 bpm.  Sinus rhythm and PVCs noted.   19 atrial runs noted  Risk Assessment/Calculations:             Physical Exam:   VS:  BP 138/78   Pulse 76   Ht 5\' 7"  (1.702 m)   Wt 143 lb 9.6 oz (65.1 kg)   SpO2 97%   BMI 22.49 kg/m    Wt Readings from Last 3 Encounters:  08/15/23 143 lb 9.6 oz (65.1 kg)  08/03/23 145 lb (65.8 kg)  07/11/23 144 lb 6.4 oz (65.5 kg)    GEN: Well nourished, well developed in no acute distress; well-groomed.  Healthy-appearing; smells of cigarette smoke NECK: No JVD; No carotid bruits CARDIAC: Normal S1, S2; RRR, no murmurs, rubs, gallops RESPIRATORY:  Clear to auscultation without rales, wheezing or rhonchi ; nonlabored, good air movement. ABDOMEN: Soft, non-tender, non-distended EXTREMITIES:  No edema; No deformity      ASSESSMENT AND PLAN: .    Problem List Items Addressed This Visit       Cardiology Problems   Coronary artery disease involving native coronary artery of native heart without angina pectoris - Primary (Chronic)   Atypical symptoms at most.  Distal LCx lesion was noted on cath but was otherwise minimal disease. Plan is BP, glycemic and hypertension control along with smoking station counseling. -Continue bisoprolol 5 midodrine daily and convert from amlodipine to chlorthalidone 25 mg daily -Continue atorvastatin 80 mg daily with excellent lipid control -On Januvia and metformin for diabetes => last A1c 7.1.  Defer to PCP for management.      Relevant Medications   chlorthalidone (HYGROTON) 25 MG tablet   Essential hypertension (Chronic)   Hypertension managed with amlodipine 2.5 mg daily and bisoprolol 5 mg daily.  Reports ankle edema, likely due to amlodipine, and experiences orthopnea and occasional angina.  Plan to switch to chlorthalidone for improved blood pressure control and reduced edema. Will monitor renal function and electrolytes for adverse effects from medication change. - Discontinue amlodipine - Start chlorthalidone 25 mg daily - Check basic metabolic  panel (BMP) in two weeks to monitor potassium, sodium, and kidney function  -> Close follow-up in 4 months with APP for BP control, and if still symptomatic with orthopnea/PND, would recheck 2D Echo      Relevant Medications   chlorthalidone (HYGROTON) 25 MG tablet   Other Relevant Orders   Basic metabolic panel with GFR   Hyperlipidemia associated with type 2 diabetes mellitus (HCC) (Chronic)   Diabetes Mellitus Type 2 Diabetes managed with metformin and Januvia. Recent hemoglobin A1c at 7.1% indicates reasonable control.  Hyperlipidemia => well-controlled on atorvastatin 80 mg daily.  Tolerating relatively well without any myalgias. Recent lipid panel: total cholesterol 112 mg/dL, HDL 36 mg/dL, LDL 55 mg/dL, triglycerides 782 mg/dL. -Continue high-dose atorvastatin milligrams daily along with fish oil. -Dietary modification and increased exercise..      Relevant Medications   chlorthalidone (HYGROTON) 25 MG tablet   Other Relevant Orders   Basic metabolic panel with GFR     Other   Atypical chest pain   Evaluated for similar  symptoms these are having now back in 2017 with relatively normal cath.  FFR negative D1 lesion.  His symptoms are not always current occurring and not with routine activity. For now would continue her beta-blocker but I am switching from amlodipine to chlorthalidone to help with volume removal and afterload reduction.      DOE (dyspnea on exertion)   Exertional dyspnea and PND/orthopnea => Likely multifactorial with COPD being a major limiting factor.  Had previously been evaluated with an echocardiogram and heart catheterization that were relatively normal.  Continue to titrate medications for BP control and reevaluate.  Low threshold to reassess with echo plus or minus ischemic evaluation with Coronary CTA      Tobacco use disorder (Chronic)   Smoking cessation instruction/counseling given:  counseled patient on the dangers of tobacco use, advised  patient to stop smoking, and reviewed strategies to maximize success Long history of smoking with unsuccessful nicotine patch use. Discussed alternative cessation strategies, including gradual reduction and Nicorette gum, which may help manage cravings. - Encourage gradual reduction in cigarette intake - Consider using Nicorette gum as an alternative to smoking      Relevant Orders   Basic metabolic panel with GFR    Follow-up Plan to evaluate response to medication change and monitor blood pressure and edema. Will assess chlorthalidone's effectiveness and adjust treatment as necessary.  - Return in about 4 months (around 12/15/2023) for Alternate 4 months with APP, 8 month with MD, [facility] [address] Office. - Schedule follow-up appointment with APP or CVRR in four months to assess blood pressure and response to medication change.;   if still symptomatic with orthopnea/PND, would recheck 2D Echo; and consider stress test versus coronary CTA     Signed, Marykay Lex, MD, MS Bryan Lemma, M.D., M.S. Interventional Cardiologist  [facility]  Pager # [phone] Phone # [phone] [address] [place], [place] [address]

## 2023-08-15 NOTE — Assessment & Plan Note (Signed)
 Hypertension managed with amlodipine 2.5 mg daily and bisoprolol 5 mg daily.  Reports ankle edema, likely due to amlodipine, and experiences orthopnea and occasional angina.  Plan to switch to chlorthalidone for improved blood pressure control and reduced edema. Will monitor renal function and electrolytes for adverse effects from medication change. - Discontinue amlodipine - Start chlorthalidone 25 mg daily - Check basic metabolic panel (BMP) in two weeks to monitor potassium, sodium, and kidney function  -> Close follow-up in 4 months with APP for BP control, and if still symptomatic with orthopnea/PND, would recheck 2D Echo

## 2023-08-15 NOTE — Patient Instructions (Addendum)
 Medication Instructions:   Stop taking Amlodipine   Start taking  Chlorthalidone 25 mg daily     *If you need a refill on your cardiac medications before your next appointment, please call your pharmacy*   Lab Work:  2 weeks after starting new medication have lab - BMP completed.   If you have labs (blood work) drawn today and your tests are completely normal, you will receive your results only by: MyChart Message (if you have MyChart) OR A paper copy in the mail If you have any lab test that is abnormal or we need to change your treatment, we will call you to review the results.   Testing/Procedures:  Not needed  Follow-Up: At [facility], you and your health needs are our priority.  As part of our continuing mission to provide you with exceptional heart care, we have created designated Provider Care Teams.  These Care Teams include your primary Cardiologist (physician) and Advanced Practice Providers (APPs -  Physician Assistants and Nurse Practitioners) who all work together to provide you with the care you need, when you need it.     Your next appointment:   4 month(s)  The format for your next appointment:   In Person  Provider:   Edd Fabian, FNP, Joni Reining, DNP, ANP, Bernadene Person, NP, or Reather Littler, NP    or  CVRR- for blood pressure.   Other Instructions  s

## 2023-08-15 NOTE — Assessment & Plan Note (Signed)
 Diabetes Mellitus Type 2 Diabetes managed with metformin and Januvia. Recent hemoglobin A1c at 7.1% indicates reasonable control.  Hyperlipidemia => well-controlled on atorvastatin 80 mg daily.  Tolerating relatively well without any myalgias. Recent lipid panel: total cholesterol 112 mg/dL, HDL 36 mg/dL, LDL 55 mg/dL, triglycerides 161 mg/dL. -Continue high-dose atorvastatin milligrams daily along with fish oil. -Dietary modification and increased exercise.Marland Kitchen

## 2023-08-15 NOTE — Assessment & Plan Note (Signed)
 Exertional dyspnea and PND/orthopnea => Likely multifactorial with COPD being a major limiting factor.  Had previously been evaluated with an echocardiogram and heart catheterization that were relatively normal.  Continue to titrate medications for BP control and reevaluate.  Low threshold to reassess with echo plus or minus ischemic evaluation with Coronary CTA

## 2023-08-15 NOTE — Assessment & Plan Note (Signed)
 Atypical symptoms at most.  Distal LCx lesion was noted on cath but was otherwise minimal disease. Plan is BP, glycemic and hypertension control along with smoking station counseling. -Continue bisoprolol 5 midodrine daily and convert from amlodipine to chlorthalidone 25 mg daily -Continue atorvastatin 80 mg daily with excellent lipid control -On Januvia and metformin for diabetes => last A1c 7.1.  Defer to PCP for management.

## 2023-08-15 NOTE — Assessment & Plan Note (Signed)
 Evaluated for similar symptoms these are having now back in 2017 with relatively normal cath.  FFR negative D1 lesion.  His symptoms are not always current occurring and not with routine activity. For now would continue her beta-blocker but I am switching from amlodipine to chlorthalidone to help with volume removal and afterload reduction.

## 2023-08-15 NOTE — Assessment & Plan Note (Addendum)
 Smoking cessation instruction/counseling given:  counseled patient on the dangers of tobacco use, advised patient to stop smoking, and reviewed strategies to maximize success Long history of smoking with unsuccessful nicotine patch use. Discussed alternative cessation strategies, including gradual reduction and Nicorette gum, which may help manage cravings. - Encourage gradual reduction in cigarette intake - Consider using Nicorette gum as an alternative to smoking

## 2023-08-28 ENCOUNTER — Other Ambulatory Visit: Payer: Self-pay | Admitting: Cardiology

## 2023-08-31 ENCOUNTER — Other Ambulatory Visit

## 2023-09-01 ENCOUNTER — Encounter: Payer: Self-pay | Admitting: Cardiology

## 2023-09-01 LAB — BASIC METABOLIC PANEL WITH GFR
BUN/Creatinine Ratio: 13 (ref 10–24)
BUN: 9 mg/dL (ref 8–27)
CO2: 23 mmol/L (ref 20–29)
Calcium: 9.6 mg/dL (ref 8.6–10.2)
Chloride: 87 mmol/L — ABNORMAL LOW (ref 96–106)
Creatinine, Ser: 0.71 mg/dL — ABNORMAL LOW (ref 0.76–1.27)
Glucose: 114 mg/dL — ABNORMAL HIGH (ref 70–99)
Potassium: 5.1 mmol/L (ref 3.5–5.2)
Sodium: 128 mmol/L — ABNORMAL LOW (ref 134–144)
eGFR: 104 mL/min/{1.73_m2} (ref >59)

## 2023-09-02 ENCOUNTER — Other Ambulatory Visit: Payer: Self-pay | Admitting: *Deleted

## 2023-09-02 ENCOUNTER — Telehealth: Payer: Self-pay | Admitting: *Deleted

## 2023-09-02 DIAGNOSIS — R899 Unspecified abnormal finding in specimens from other organs, systems and tissues: Secondary | ICD-10-CM

## 2023-09-02 DIAGNOSIS — E871 Hypo-osmolality and hyponatremia: Secondary | ICD-10-CM

## 2023-09-02 MED ORDER — CHLORTHALIDONE 25 MG PO TABS: 12.5000 mg | ORAL_TABLET | Freq: Every day | ORAL | Status: DC

## 2023-09-02 NOTE — Telephone Encounter (Signed)
-----   Message from Randene Bustard sent at 09/01/2023  6:58 PM EDT ----- Chemistry panel is notable for low sodium levels but normal kidney function.  Mild glucose elevation.  Sodium being down is a little bit concerning -> I would like to hold chlorthalidone  for 1 week and then reduce to one half dose.  We can recheck a chemistry panel after 2 weeks  Randene Bustard, MD

## 2023-09-02 NOTE — Telephone Encounter (Signed)
 Called left mess age for to follow instruction per Dr Addie Holstein. Ordered placed for BMP in 2 weeks  , medication reduced 12.5 mg . Request patient to call and confirmed he received message

## 2023-09-05 ENCOUNTER — Ambulatory Visit

## 2023-09-07 ENCOUNTER — Encounter: Payer: Self-pay | Admitting: Acute Care

## 2023-09-08 ENCOUNTER — Ambulatory Visit

## 2023-09-29 ENCOUNTER — Ambulatory Visit: Payer: Self-pay | Admitting: *Deleted

## 2023-09-29 NOTE — Telephone Encounter (Signed)
 Late entry 09/02/23 Called left mess age for to follow instruction per Dr Addie Holstein. Ordered placed , medication reduced. Request patient to call and confirmed he received message

## 2023-09-29 NOTE — Telephone Encounter (Signed)
-----   Message from Randene Bustard sent at 09/01/2023  6:58 PM EDT ----- Chemistry panel is notable for low sodium levels but normal kidney function.  Mild glucose elevation.  Sodium being down is a little bit concerning -> I would like to hold chlorthalidone  for 1 week and then reduce to one half dose.  We can recheck a chemistry panel after 2 weeks  Randene Bustard, MD

## 2023-09-29 NOTE — Telephone Encounter (Signed)
 Called and left another message to call about instruction from Dr Addie Holstein.   A letter has been mailed.

## 2023-10-06 ENCOUNTER — Encounter: Payer: Self-pay | Admitting: Family Medicine

## 2023-10-06 ENCOUNTER — Ambulatory Visit (INDEPENDENT_AMBULATORY_CARE_PROVIDER_SITE_OTHER): Admitting: Family Medicine

## 2023-10-06 VITALS — BP 131/72 | HR 65 | Temp 98.2°F | Ht 67.0 in | Wt 149.2 lb

## 2023-10-06 DIAGNOSIS — J432 Centrilobular emphysema: Secondary | ICD-10-CM | POA: Diagnosis not present

## 2023-10-06 DIAGNOSIS — B372 Candidiasis of skin and nail: Secondary | ICD-10-CM

## 2023-10-06 MED ORDER — ALBUTEROL SULFATE HFA 108 (90 BASE) MCG/ACT IN AERS: 1.0000 | INHALATION_SPRAY | RESPIRATORY_TRACT | 1 refills | Status: DC | PRN

## 2023-10-06 MED ORDER — NYSTATIN 100000 UNIT/GM EX CREA: 1.0000 | TOPICAL_CREAM | Freq: Two times a day (BID) | CUTANEOUS | 0 refills | Status: AC

## 2023-10-06 NOTE — Progress Notes (Signed)
   Acute Office Visit  Subjective:     Patient ID: Larry Floyd, male    DOB: March 31, 1961, 63 y.o.   MRN: [id number]  Chief Complaint  Patient presents with   Rash    Rash This is a new problem. The current episode started in the past 7 days. The problem has been gradually worsening since onset. The affected locations include the neck. The rash is characterized by peeling, redness, burning and itchiness. He was exposed to nothing. Pertinent negatives include no diarrhea, eye pain, facial edema, fever, sore throat or vomiting. Past treatments include topical steroids. Improvement on treatment: made worse.   Also needs refill on albuterol  inhaler. He has been using this 1-2x a day for wheezing, shortness breath. Has cough with Blanton phlegm. Has been out of trelegy.   Review of Systems  Constitutional:  Negative for fever.  HENT:  Negative for sore throat.   Eyes:  Negative for pain.  Gastrointestinal:  Negative for diarrhea and vomiting.  Skin:  Positive for rash.        Objective:    BP 131/72   Pulse 65   Temp 98.2 F (36.8 C) (Temporal)   Ht 5\' 7"  (1.702 m)   Wt 149 lb 3.2 oz (67.7 kg)   SpO2 95%   BMI 23.37 kg/m    Physical Exam Vitals and nursing note reviewed.  Constitutional:      General: He is not in acute distress.    Appearance: He is not ill-appearing, toxic-appearing or diaphoretic.  Cardiovascular:     Rate and Rhythm: Normal rate and regular rhythm.     Heart sounds: Normal heart sounds. No murmur heard. Pulmonary:     Effort: Pulmonary effort is normal. No respiratory distress.     Breath sounds: Normal breath sounds. No wheezing, rhonchi or rales.  Musculoskeletal:     Right lower leg: No edema.     Left lower leg: No edema.  Skin:    General: Skin is warm and dry.     Findings: Rash (erythematous rash to anterior neck with satelittle lesion. Peeling skin. No exudate.) present.  Neurological:     General: No focal deficit present.     Mental  Status: He is alert and oriented to person, place, and time.  Psychiatric:        Mood and Affect: Mood normal.        Behavior: Behavior normal.     No results found for any visits on 10/06/23.      Assessment & Plan:   Tyshon was seen today for rash.  Diagnoses and all orders for this visit:  Yeast dermatitis Nystatin as below. Keep area clean and dry. Avoid shaving until rash resolves.  -     nystatin cream (MYCOSTATIN); Apply 1 Application topically 2 (two) times daily for 10 days.  Centrilobular emphysema (HCC) Not well controlled. Samples of trelegy provided today. Discussed compliance with trelegy. Continue albuterol  prn.  -     albuterol  (VENTOLIN  HFA) 108 (90 Base) MCG/ACT inhaler; Inhale 1-2 puffs into the lungs every 4 (four) hours as needed for wheezing or shortness of breath.  Return if symptoms worsen or fail to improve.  The patient indicates understanding of these issues and agrees with the plan.  Albertha Huger, FNP

## 2023-10-12 ENCOUNTER — Ambulatory Visit: Payer: No Typology Code available for payment source | Admitting: Family Medicine

## 2023-10-12 VITALS — BP 131/72 | HR 64 | Temp 98.0°F | Ht 67.0 in | Wt 148.8 lb

## 2023-10-12 DIAGNOSIS — Z7984 Long term (current) use of oral hypoglycemic drugs: Secondary | ICD-10-CM

## 2023-10-12 DIAGNOSIS — J432 Centrilobular emphysema: Secondary | ICD-10-CM

## 2023-10-12 DIAGNOSIS — R899 Unspecified abnormal finding in specimens from other organs, systems and tissues: Secondary | ICD-10-CM | POA: Diagnosis not present

## 2023-10-12 DIAGNOSIS — K219 Gastro-esophageal reflux disease without esophagitis: Secondary | ICD-10-CM

## 2023-10-12 DIAGNOSIS — E1140 Type 2 diabetes mellitus with diabetic neuropathy, unspecified: Secondary | ICD-10-CM | POA: Diagnosis not present

## 2023-10-12 DIAGNOSIS — F5104 Psychophysiologic insomnia: Secondary | ICD-10-CM

## 2023-10-12 DIAGNOSIS — F411 Generalized anxiety disorder: Secondary | ICD-10-CM

## 2023-10-12 DIAGNOSIS — E1159 Type 2 diabetes mellitus with other circulatory complications: Secondary | ICD-10-CM | POA: Diagnosis not present

## 2023-10-12 DIAGNOSIS — E871 Hypo-osmolality and hyponatremia: Secondary | ICD-10-CM

## 2023-10-12 DIAGNOSIS — I152 Hypertension secondary to endocrine disorders: Secondary | ICD-10-CM

## 2023-10-12 DIAGNOSIS — R195 Other fecal abnormalities: Secondary | ICD-10-CM

## 2023-10-12 DIAGNOSIS — F1090 Alcohol use, unspecified, uncomplicated: Secondary | ICD-10-CM

## 2023-10-12 LAB — BAYER DCA HB A1C WAIVED: HB A1C (BAYER DCA - WAIVED): 5.9 % — ABNORMAL HIGH (ref 4.8–5.6)

## 2023-10-12 MED ORDER — ALBUTEROL SULFATE HFA 108 (90 BASE) MCG/ACT IN AERS
1.0000 | INHALATION_SPRAY | RESPIRATORY_TRACT | 2 refills | Status: DC | PRN
Start: 2023-10-12 — End: 2024-01-03

## 2023-10-12 MED ORDER — TRAZODONE HCL 50 MG PO TABS: 25.0000 mg | ORAL_TABLET | Freq: Every evening | ORAL | 3 refills | Status: AC | PRN

## 2023-10-12 NOTE — Progress Notes (Signed)
 Established Patient Office Visit  Subjective   Patient ID: Larry Floyd, male    DOB: Sep 03, 1960  Age: 63 y.o. MRN: [id number]  Chief Complaint  Patient presents with   Medical Management of Chronic Issues    HPI  T2DM Pt presents for follow up evaluation of Type 2 diabetes mellitus. Patient denies foot ulcerations, increased appetite, nausea, paresthesia of the feet, polydipsia, polyuria, visual disturbances, vomiting, and weight loss.  Current diabetic medications include januvia  100 mg, metformin  1000 mg BID Compliant with meds - yes  Current monitoring regimen: none Current diet: in general, an "unhealthy" diet Current exercise: none  Urine microalbumin UTD? Yes Is He on ACE inhibitor or angiotensin II receptor blocker?  No, declines Is He on statin? Yes atorvastatin  Is He on ASA 81 mg daily?  Yes  2. HTN Complaint with meds - Yes Current Medications - amlodipine , bisoprolol , chlorthalidone  Checking BP at home ranging 100s/60s Pertinent ROS:  Headache - No Fatigue - yes chronic Visual Disturbances - No Chest pain - No Dizziness - mostly in morning for a few hours Dyspnea - baseline Palpitations - No LE edema - No  Drinking up to 6 bottles a water a day. 7 cups of coffee a day.   3. COPD Using trelegy every day. Taking albuterol  2x a day. Has CT scan coming up with pulmonology.   4. GERD Compliant with medications - Yes Current medications - omeprazole  20 mg daily Voice change - No Hemoptysis - No Dysphagia or dyspepsia - No Water brash - No Red Flags (weight loss, hematochezia, melena, weight loss, early satiety, fevers, odynophagia, or persistent vomiting) - No  5. Anxiety/depression Compliant with zoloft  150 mg daily. Struggling with sleep. Falls asleep watching TV and then can't get back to sleep. Getting a few hours at night at most. Tried melatonin. Reports cutting back on alcohol use- has a few shots 1-2x a week.      10/12/2023    2:36 PM  10/06/2023    1:05 PM 07/11/2023    3:24 PM  Depression screen PHQ 2/9  Decreased Interest 0 0 0  Down, Depressed, Hopeless 0 0 0  PHQ - 2 Score 0 0 0  Altered sleeping 0 3 2  Tired, decreased energy 0 1 2  Change in appetite 0 1 1  Feeling bad or failure about yourself  0 0 0  Trouble concentrating 0 0 0  Moving slowly or fidgety/restless 0 0 0  Suicidal thoughts 0 0 0  PHQ-9 Score 0 5 5  Difficult doing work/chores Not difficult at all Somewhat difficult Somewhat difficult      10/12/2023    2:37 PM 10/06/2023    1:07 PM 07/11/2023    3:24 PM 06/10/2023   11:09 AM  GAD 7 : Generalized Anxiety Score  Nervous, Anxious, on Edge 0 3 2 2   Control/stop worrying 0 3 2 2   Worry too much - different things 0 3 2 2   Trouble relaxing 0 3 1 2   Restless 0 1 1 2   Easily annoyed or irritable 0 1 1 2   Afraid - awful might happen 0 0 0 0  Total GAD 7 Score 0 14 9 12   Anxiety Difficulty Not difficult at all Somewhat difficult Somewhat difficult Somewhat difficult      Past Medical History:  Diagnosis Date   Arthritis    Bronchitis    Cataract    Diabetes mellitus without complication (HCC)    Emphysema lung (HCC)  GERD (gastroesophageal reflux disease)    Hypercholesteremia    Hypertension       ROS As per HPI.    Objective:     BP 131/72   Pulse 64   Temp 98 F (36.7 C) (Temporal)   Ht 5\' 7"  (1.702 m)   Wt 148 lb 12.8 oz (67.5 kg)   SpO2 97%   BMI 23.31 kg/m   Wt Readings from Last 3 Encounters:  10/12/23 148 lb 12.8 oz (67.5 kg)  10/06/23 149 lb 3.2 oz (67.7 kg)  08/15/23 143 lb 9.6 oz (65.1 kg)     Physical Exam Vitals and nursing note reviewed.  Constitutional:      General: He is not in acute distress.    Appearance: Normal appearance. He is not ill-appearing, toxic-appearing or diaphoretic.  Neck:     Vascular: No carotid bruit.  Cardiovascular:     Rate and Rhythm: Normal rate and regular rhythm.     Heart sounds: Normal heart sounds. No murmur  heard. Pulmonary:     Effort: Pulmonary effort is normal. No respiratory distress.     Breath sounds: Normal breath sounds. No wheezing, rhonchi or rales.  Abdominal:     General: Bowel sounds are normal. There is no distension.     Palpations: Abdomen is soft.     Tenderness: There is no abdominal tenderness. There is no guarding or rebound.  Musculoskeletal:     Cervical back: Neck supple. No rigidity.     Right lower leg: No edema.     Left lower leg: No edema.  Skin:    General: Skin is warm and dry.  Neurological:     General: No focal deficit present.     Mental Status: He is alert and oriented to person, place, and time.     Motor: No weakness.     Gait: Gait normal.  Psychiatric:        Mood and Affect: Mood normal.        Behavior: Behavior normal.    No results found for any visits on 10/12/23.  The ASCVD Risk score (Arnett DK, et al., 2019) failed to calculate for the following reasons:   The valid total cholesterol range is 130 to 320 mg/dL    Assessment & Plan:   Labrandon was seen today for medical management of chronic issues.  Diagnoses and all orders for this visit:  Type 2 diabetes mellitus with diabetic neuropathy, without long-term current use of insulin  (HCC) A1c 5.9, at goal of <7. On statin. Declined ACE/ARB.  -     Bayer DCA Hb A1c Waived  Hypertension associated with diabetes (HCC) Well controlled on current regimen.   Centrilobular emphysema (HCC) Stable symptoms.  -     albuterol  (VENTOLIN  HFA) 108 (90 Base) MCG/ACT inhaler; Inhale 1-2 puffs into the lungs every 4 (four) hours as needed for wheezing or shortness of breath.  Generalized anxiety disorder Continue zoloft .   Chronic insomnia Try trazodone  prn. Discussed reducing caffeine intake, alcohol, and sleep hygiene.  -     traZODone  (DESYREL ) 50 MG tablet; Take 0.5-1 tablets (25-50 mg total) by mouth at bedtime as needed for sleep.  Gastroesophageal reflux disease, unspecified whether  esophagitis present Controlled with PPI.   Alcohol use disorder Cutting back.   Positive colorectal cancer screening using Cologuard test Referral discussed and placed.  -     Ambulatory referral to Gastroenterology  Abnormal laboratory test result -     Basic metabolic  panel with GFR  Low sodium levels -     Basic metabolic panel with GFR   Return in about 3 months (around 01/12/2024) for chronic follow up.  The patient indicates understanding of these issues and agrees with the plan.  Albertha Huger, FNP

## 2023-10-13 ENCOUNTER — Ambulatory Visit: Payer: Self-pay | Admitting: Cardiology

## 2023-10-13 ENCOUNTER — Ambulatory Visit: Payer: Self-pay | Admitting: Family Medicine

## 2023-10-13 LAB — BASIC METABOLIC PANEL WITH GFR
BUN/Creatinine Ratio: 15 (ref 10–24)
BUN: 13 mg/dL (ref 8–27)
CO2: 22 mmol/L (ref 20–29)
Calcium: 9.6 mg/dL (ref 8.6–10.2)
Chloride: 95 mmol/L — ABNORMAL LOW (ref 96–106)
Creatinine, Ser: 0.87 mg/dL (ref 0.76–1.27)
Glucose: 96 mg/dL (ref 70–99)
Potassium: 4.5 mmol/L (ref 3.5–5.2)
Sodium: 135 mmol/L (ref 134–144)
eGFR: 98 mL/min/{1.73_m2} (ref >59)

## 2023-10-18 ENCOUNTER — Encounter: Payer: Self-pay | Admitting: Family Medicine

## 2023-11-02 ENCOUNTER — Telehealth: Payer: Self-pay | Admitting: Family Medicine

## 2023-11-02 ENCOUNTER — Other Ambulatory Visit: Payer: Self-pay | Admitting: Family Medicine

## 2023-11-02 DIAGNOSIS — G8929 Other chronic pain: Secondary | ICD-10-CM

## 2023-11-02 NOTE — Telephone Encounter (Signed)
 Wife said she would call back tomorrow to schedule  diabetic eye exam

## 2023-11-06 ENCOUNTER — Other Ambulatory Visit: Payer: Self-pay | Admitting: Family Medicine

## 2023-11-06 DIAGNOSIS — G8929 Other chronic pain: Secondary | ICD-10-CM

## 2023-11-17 ENCOUNTER — Encounter: Payer: Self-pay | Admitting: Internal Medicine

## 2023-11-28 ENCOUNTER — Telehealth: Payer: Self-pay

## 2023-11-28 ENCOUNTER — Other Ambulatory Visit (HOSPITAL_COMMUNITY): Payer: Self-pay

## 2023-11-28 NOTE — Telephone Encounter (Signed)
 Pharmacy Patient Advocate Encounter   Received notification from Fax that prior authorization for BISOPROLOL  is required/requested.   Insurance verification completed.   The patient is insured through [facility] .   Per test claim: PA required; PA submitted to above mentioned insurance via CoverMyMeds Key/confirmation #/EOC [id number] Status is pending

## 2023-12-01 NOTE — Telephone Encounter (Signed)
 Will send this message to Dr. Anner and RN, to further review and advise on next steps for pts denied bisoprolol  regimen.

## 2023-12-01 NOTE — Telephone Encounter (Signed)
 Pharmacy Patient Advocate Encounter  Received notification from [facility] that Prior Authorization for BISOPROLOL  has been DENIED.  Full denial letter will be uploaded to the media tab. See denial reason below.   MUST TRY AND FAIL PREFERRED DRUGS FIRST (PREFERRED DRUG LIST ON DENIAL IN CHART MEDIA)

## 2023-12-02 MED ORDER — METOPROLOL SUCCINATE ER 25 MG PO TB24: 25.0000 mg | ORAL_TABLET | Freq: Every day | ORAL | 3 refills | Status: AC

## 2023-12-02 NOTE — Telephone Encounter (Signed)
 Called left message on voicemail - insurance would not cover Bisoprolol  .  Per Dr Anner, start Metoprolol succinate 25 mg  daily , discontinue bisoprolol  since prior authorization was denied.   Medication sent to pharmacy . Message to use all the bisoprolol  once bottle is empty start Metoprolol succinate 25 mg daily    Any question can call back .

## 2023-12-02 NOTE — Addendum Note (Signed)
 Addended by: GLADIS REENA GAILS on: 12/02/2023 04:56 PM   Modules accepted: Orders

## 2023-12-02 NOTE — Telephone Encounter (Signed)
 Try converting to Toprol 25 mg daily  New Rx metoprolol succinate 25 mg p.o. daily; dispense #90 tabs, 3 refills.  Alm Clay, MD

## 2023-12-06 ENCOUNTER — Ambulatory Visit (INDEPENDENT_AMBULATORY_CARE_PROVIDER_SITE_OTHER)

## 2023-12-06 DIAGNOSIS — E1140 Type 2 diabetes mellitus with diabetic neuropathy, unspecified: Secondary | ICD-10-CM

## 2023-12-06 LAB — HM DIABETES EYE EXAM

## 2023-12-06 NOTE — Progress Notes (Signed)
 Larry Floyd arrived 12/06/2023 and has given verbal consent to obtain images and complete their overdue diabetic retinal screening.  The images have been sent to an ophthalmologist or optometrist for review and interpretation.  Results will be sent back to Joesph Annabella HERO, FNP for review.  Patient has been informed they will be contacted when we receive the results via telephone or MyChart

## 2023-12-13 ENCOUNTER — Ambulatory Visit: Admitting: General Practice

## 2023-12-25 ENCOUNTER — Other Ambulatory Visit: Payer: Self-pay | Admitting: Family Medicine

## 2023-12-25 DIAGNOSIS — E1140 Type 2 diabetes mellitus with diabetic neuropathy, unspecified: Secondary | ICD-10-CM

## 2024-01-01 ENCOUNTER — Other Ambulatory Visit: Payer: Self-pay | Admitting: Family Medicine

## 2024-01-01 DIAGNOSIS — E1140 Type 2 diabetes mellitus with diabetic neuropathy, unspecified: Secondary | ICD-10-CM

## 2024-01-03 ENCOUNTER — Other Ambulatory Visit: Payer: Self-pay | Admitting: Family Medicine

## 2024-01-03 DIAGNOSIS — J432 Centrilobular emphysema: Secondary | ICD-10-CM

## 2024-01-03 NOTE — Telephone Encounter (Unsigned)
 Copied from CRM [id number]. Topic: Clinical - Medication Refill >> Jan 03, 2024 12:05 PM Emylou G wrote: Medication: albuterol  (VENTOLIN  HFA) 108 (90 Base) MCG/ACT inhaler  Has the patient contacted their pharmacy? Yes (Agent: If no, request that the patient contact the pharmacy for the refill. If patient does not wish to contact the pharmacy document the reason why and proceed with request.) (Agent: If yes, when and what did the pharmacy advise?) said to call us   This is the patient's preferred pharmacy:  [facility] - [place], [place] - [address] [place] [address] Phone: [phone] Fax: [phone]  Is this the correct pharmacy for this prescription? Yes If no, delete pharmacy and type the correct one.   Has the prescription been filled recently? No  Is the patient out of the medication? Yes  Has the patient been seen for an appointment in the last year OR does the patient have an upcoming appointment? Yes  Can we respond through MyChart? No  Agent: Please be advised that Rx refills may take up to 3 business days. We ask that you follow-up with your pharmacy.

## 2024-01-04 ENCOUNTER — Other Ambulatory Visit (HOSPITAL_COMMUNITY): Payer: Self-pay

## 2024-01-04 ENCOUNTER — Telehealth: Payer: Self-pay

## 2024-01-04 MED ORDER — ALBUTEROL SULFATE HFA 108 (90 BASE) MCG/ACT IN AERS: 1.0000 | INHALATION_SPRAY | RESPIRATORY_TRACT | 2 refills | Status: DC | PRN

## 2024-01-04 NOTE — Telephone Encounter (Signed)
 Pharmacy Patient Advocate Encounter   Received notification from Onbase that prior authorization for Ventolin  HFA 108 is required/requested.   Insurance verification completed.   The patient is insured through [employer] .   Per test claim: Refill too soon. PA is not needed at this time. Medication was filled 01/01/24. Next eligible fill date is 01/13/24. Current PA expires 11/13/24

## 2024-01-05 ENCOUNTER — Other Ambulatory Visit (HOSPITAL_COMMUNITY): Payer: Self-pay

## 2024-01-10 ENCOUNTER — Other Ambulatory Visit (HOSPITAL_COMMUNITY): Payer: Self-pay

## 2024-01-18 ENCOUNTER — Ambulatory Visit: Admitting: Family Medicine

## 2024-01-19 ENCOUNTER — Encounter: Payer: Self-pay | Admitting: Family Medicine

## 2024-01-23 ENCOUNTER — Encounter: Payer: Self-pay | Admitting: Family Medicine

## 2024-01-23 ENCOUNTER — Ambulatory Visit (INDEPENDENT_AMBULATORY_CARE_PROVIDER_SITE_OTHER): Admitting: Family Medicine

## 2024-01-23 VITALS — BP 133/73 | HR 77 | Temp 98.3°F | Ht 67.0 in | Wt 146.4 lb

## 2024-01-23 DIAGNOSIS — I2510 Atherosclerotic heart disease of native coronary artery without angina pectoris: Secondary | ICD-10-CM | POA: Diagnosis not present

## 2024-01-23 DIAGNOSIS — J441 Chronic obstructive pulmonary disease with (acute) exacerbation: Secondary | ICD-10-CM

## 2024-01-23 DIAGNOSIS — E1140 Type 2 diabetes mellitus with diabetic neuropathy, unspecified: Secondary | ICD-10-CM | POA: Diagnosis not present

## 2024-01-23 DIAGNOSIS — I471 Supraventricular tachycardia, unspecified: Secondary | ICD-10-CM

## 2024-01-23 DIAGNOSIS — I152 Hypertension secondary to endocrine disorders: Secondary | ICD-10-CM

## 2024-01-23 DIAGNOSIS — K219 Gastro-esophageal reflux disease without esophagitis: Secondary | ICD-10-CM

## 2024-01-23 DIAGNOSIS — F411 Generalized anxiety disorder: Secondary | ICD-10-CM | POA: Diagnosis not present

## 2024-01-23 DIAGNOSIS — E1169 Type 2 diabetes mellitus with other specified complication: Secondary | ICD-10-CM

## 2024-01-23 DIAGNOSIS — J301 Allergic rhinitis due to pollen: Secondary | ICD-10-CM | POA: Diagnosis not present

## 2024-01-23 DIAGNOSIS — F1090 Alcohol use, unspecified, uncomplicated: Secondary | ICD-10-CM | POA: Diagnosis not present

## 2024-01-23 DIAGNOSIS — Z23 Encounter for immunization: Secondary | ICD-10-CM | POA: Diagnosis not present

## 2024-01-23 DIAGNOSIS — E1159 Type 2 diabetes mellitus with other circulatory complications: Secondary | ICD-10-CM | POA: Diagnosis not present

## 2024-01-23 DIAGNOSIS — E785 Hyperlipidemia, unspecified: Secondary | ICD-10-CM | POA: Diagnosis not present

## 2024-01-23 LAB — BAYER DCA HB A1C WAIVED: HB A1C (BAYER DCA - WAIVED): 5.9 % — ABNORMAL HIGH (ref 4.8–5.6)

## 2024-01-23 MED ORDER — LEVOCETIRIZINE DIHYDROCHLORIDE 5 MG PO TABS: 5.0000 mg | ORAL_TABLET | Freq: Every evening | ORAL | 3 refills | Status: AC

## 2024-01-23 MED ORDER — AZITHROMYCIN 250 MG PO TABS: ORAL_TABLET | ORAL | 0 refills | Status: DC

## 2024-01-23 MED ORDER — PREDNISONE 20 MG PO TABS: 40.0000 mg | ORAL_TABLET | Freq: Every day | ORAL | 0 refills | Status: AC

## 2024-01-23 NOTE — Progress Notes (Signed)
 Established Patient Office Visit  Subjective   Patient ID: Larry Floyd, male    DOB: 1961/03/21  Age: 62 y.o. MRN: [id number]  Chief Complaint  Patient presents with   Medical Management of Chronic Issues    HPI  History of Present Illness   Larry Floyd is a 63 year old male with COPD who presents with worsening cough and shortness of breath.  Cough and dyspnea - Worsening productive cough for the past couple of weeks - Thick yellow sputum production - Increased shortness of breath - Wheezing present both day and night - Uses Breztri inhaler every morning - Uses rescue inhaler four to five times daily - No significant nocturnal cough  Hypertension - Occasional sharp chest pains described as 'needles sticking' him - Headaches present - No swelling in feet or ankles - No palpitations or dizziness  Supraventricular tachycardia - History of supraventricular tachycardia - Currently on metoprolol . Started by cardiology recently - No current palpitations or dizziness  Diabetes mellitus - Diabetes managed with metformin  - A1c of 5.9 - Discontinued Januvia  - Not checking blood sugars  Upper respiratory and allergic symptoms - Runny nose alternating with congestion - Takes Zyrtec  for allergies with minimal effect  Gastroesophageal reflux  - Controlled with PPI          01/23/2024    2:14 PM 10/12/2023    2:36 PM 10/06/2023    1:05 PM  Depression screen PHQ 2/9  Decreased Interest 0 0 0  Down, Depressed, Hopeless 0 0 0  PHQ - 2 Score 0 0 0  Altered sleeping 0 0 3  Tired, decreased energy 0 0 1  Change in appetite 0 0 1  Feeling bad or failure about yourself  0 0 0  Trouble concentrating 0 0 0  Moving slowly or fidgety/restless 0 0 0  Suicidal thoughts 0 0 0  PHQ-9 Score 0 0 5  Difficult doing work/chores Not difficult at all Not difficult at all Somewhat difficult      01/23/2024    2:14 PM 10/12/2023    2:37 PM 10/06/2023    1:07 PM 07/11/2023    3:24  PM  GAD 7 : Generalized Anxiety Score  Nervous, Anxious, on Edge 0 0 3 2  Control/stop worrying 0 0 3 2  Worry too much - different things 0 0 3 2  Trouble relaxing 0 0 3 1  Restless 0 0 1 1  Easily annoyed or irritable 0 0 1 1  Afraid - awful might happen 0 0 0 0  Total GAD 7 Score 0 0 14 9  Anxiety Difficulty Not difficult at all Not difficult at all Somewhat difficult Somewhat difficult        ROS As per HPI.    Objective:     BP 133/73   Pulse 77   Temp 98.3 F (36.8 C) (Temporal)   Ht 5' 7 (1.702 m)   Wt 146 lb 6.4 oz (66.4 kg)   SpO2 97%   BMI 22.93 kg/m    Physical Exam Vitals and nursing note reviewed.  Constitutional:      General: Larry Floyd is not in acute distress.    Appearance: Normal appearance. Larry Floyd is not ill-appearing, toxic-appearing or diaphoretic.  Cardiovascular:     Rate and Rhythm: Normal rate and regular rhythm.     Pulses: Normal pulses.     Heart sounds: Normal heart sounds. No murmur heard. Pulmonary:     Effort: Pulmonary effort is  normal. No respiratory distress.     Breath sounds: Normal breath sounds. No wheezing, rhonchi or rales.  Chest:     Chest wall: No tenderness.  Abdominal:     General: Bowel sounds are normal. There is no distension.     Palpations: Abdomen is soft. There is no mass.     Tenderness: There is no abdominal tenderness. There is no guarding or rebound.  Musculoskeletal:     Right lower leg: No edema.     Left lower leg: No edema.  Skin:    General: Skin is warm and dry.  Neurological:     General: No focal deficit present.     Mental Status: Larry Floyd is alert and oriented to person, place, and time.  Psychiatric:        Mood and Affect: Mood normal.        Behavior: Behavior normal.      No results found for any visits on 01/23/24.    The ASCVD Risk score (Arnett DK, et al., 2019) failed to calculate for the following reasons:   The valid total cholesterol range is 130 to 320 mg/dL    Assessment & Plan:    Larry Floyd was seen today for medical management of chronic issues.  Diagnoses and all orders for this visit:  Type 2 diabetes mellitus with diabetic neuropathy, without long-term current use of insulin  (HCC) -     Bayer DCA Hb A1c Waived  Hypertension associated with diabetes (HCC) -     CBC with Differential/Platelet -     CMP14+EGFR  Hyperlipidemia associated with type 2 diabetes mellitus (HCC) -     Lipid panel  Alcohol use disorder -     Vitamin B12 -     Vitamin B1  Generalized anxiety disorder  Gastroesophageal reflux disease, unspecified whether esophagitis present  SVT (supraventricular tachycardia) (HCC)  Coronary artery disease involving native coronary artery of native heart without angina pectoris  COPD with exacerbation (HCC) -     predniSONE  (DELTASONE ) 20 MG tablet; Take 2 tablets (40 mg total) by mouth daily with breakfast for 5 days. -     azithromycin  (ZITHROMAX  Z-PAK) 250 MG tablet; As directed  Seasonal allergic rhinitis due to pollen -     levocetirizine (XYZAL ) 5 MG tablet; Take 1 tablet (5 mg total) by mouth every evening.  Encounter for immunization -     Flu vaccine trivalent PF, 6mos and older(Flulaval,Afluria,Fluarix,Fluzone)      COPD with acute exacerbation Acute exacerbation likely due to seasonal allergies. - Prescribed Z-Pak (azithromycin ). - Prescribed prednisone  burst (two tablets daily for five days). - Provided Breztri inhaler samples.  Allergic rhinitis Persistent symptoms despite Zyrtec  use. - Prescribed Xyzal  to be taken in the evening.  Supraventricular tachycardia SVT managed with metoprolol , heart rate stabilized.  CAD of native coronary artery - continue aspirin, statin. Follow up with cardiology.  Type 2 diabetes mellitus Well-controlled with A1c of 5.9, managed with metformin . - Continue metformin .   HTN BP at goal.   HLD Continue statin  Gastroesophageal reflux disease Continue PPI.     Depression/anxiety Controlled with zoloft .       Return in about 3 months (around 04/23/2024) for chronic follow up.  The patient indicates understanding of these issues and agrees with the plan.    Annabella CHRISTELLA Search, FNP

## 2024-01-25 ENCOUNTER — Ambulatory Visit: Payer: Self-pay | Admitting: Family Medicine

## 2024-01-25 DIAGNOSIS — E538 Deficiency of other specified B group vitamins: Secondary | ICD-10-CM

## 2024-01-25 DIAGNOSIS — J441 Chronic obstructive pulmonary disease with (acute) exacerbation: Secondary | ICD-10-CM

## 2024-01-25 MED ORDER — VITAMIN B-12 1000 MCG PO TABS: 1000.0000 ug | ORAL_TABLET | Freq: Every day | ORAL | 3 refills | Status: AC

## 2024-01-28 LAB — LIPID PANEL
Chol/HDL Ratio: 2.7 ratio (ref 0.0–5.0)
Cholesterol, Total: 129 mg/dL (ref 100–199)
HDL: 47 mg/dL (ref >39)
LDL Chol Calc (NIH): 51 mg/dL (ref 0–99)
Triglycerides: 188 mg/dL — ABNORMAL HIGH (ref 0–149)
VLDL Cholesterol Cal: 31 mg/dL (ref 5–40)

## 2024-01-28 LAB — CBC WITH DIFFERENTIAL/PLATELET
Basophils Absolute: 0 x10E3/uL (ref 0.0–0.2)
Basos: 0 %
EOS (ABSOLUTE): 0.1 x10E3/uL (ref 0.0–0.4)
Eos: 1 %
Hematocrit: 39 % (ref 37.5–51.0)
Hemoglobin: 12.5 g/dL — ABNORMAL LOW (ref 13.0–17.7)
Immature Grans (Abs): 0.1 x10E3/uL (ref 0.0–0.1)
Immature Granulocytes: 1 %
Lymphocytes Absolute: 1.4 x10E3/uL (ref 0.7–3.1)
Lymphs: 10 %
MCH: 29 pg (ref 26.6–33.0)
MCHC: 32.1 g/dL (ref 31.5–35.7)
MCV: 91 fL (ref 79–97)
Monocytes Absolute: 0.9 x10E3/uL (ref 0.1–0.9)
Monocytes: 6 %
Neutrophils Absolute: 11.5 x10E3/uL — ABNORMAL HIGH (ref 1.4–7.0)
Neutrophils: 82 %
Platelets: 325 x10E3/uL (ref 150–450)
RBC: 4.31 x10E6/uL (ref 4.14–5.80)
RDW: 14.5 % (ref 11.6–15.4)
WBC: 13.9 x10E3/uL — ABNORMAL HIGH (ref 3.4–10.8)

## 2024-01-28 LAB — CMP14+EGFR
ALT: 18 IU/L (ref 0–44)
AST: 23 IU/L (ref 0–40)
Albumin: 4.5 g/dL (ref 3.9–4.9)
Alkaline Phosphatase: 124 IU/L — ABNORMAL HIGH (ref 44–121)
BUN/Creatinine Ratio: 10 (ref 10–24)
BUN: 10 mg/dL (ref 8–27)
Bilirubin Total: 0.3 mg/dL (ref 0.0–1.2)
CO2: 22 mmol/L (ref 20–29)
Calcium: 9.5 mg/dL (ref 8.6–10.2)
Chloride: 93 mmol/L — ABNORMAL LOW (ref 96–106)
Creatinine, Ser: 0.98 mg/dL (ref 0.76–1.27)
Globulin, Total: 3.1 g/dL (ref 1.5–4.5)
Glucose: 116 mg/dL — ABNORMAL HIGH (ref 70–99)
Potassium: 4.3 mmol/L (ref 3.5–5.2)
Sodium: 131 mmol/L — ABNORMAL LOW (ref 134–144)
Total Protein: 7.6 g/dL (ref 6.0–8.5)
eGFR: 87 mL/min/1.73 (ref >59)

## 2024-01-28 LAB — VITAMIN B1: Thiamine: 125.9 nmol/L (ref 66.5–200.0)

## 2024-01-28 LAB — VITAMIN B12: Vitamin B-12: 186 pg/mL — ABNORMAL LOW (ref 232–1245)

## 2024-01-31 NOTE — Progress Notes (Deleted)
 Cardiology Clinic Note   Patient Name: Larry Floyd Date of Encounter: 01/31/2024  Primary Care Provider:  Joesph Annabella HERO, FNP Primary Cardiologist:  Alm Clay, MD  Patient Profile    ***  Past Medical History    Past Medical History:  Diagnosis Date   Arthritis    Bronchitis    Cataract    Diabetes mellitus without complication (HCC)    Emphysema lung (HCC)    GERD (gastroesophageal reflux disease)    Hypercholesteremia    Hypertension    Past Surgical History:  Procedure Laterality Date   BACK SURGERY     Lumbar   CORONARY PRESSURE/FFR STUDY N/A 11/25/2021   Procedure: INTRAVASCULAR PRESSURE WIRE/FFR STUDY;  Surgeon: Clay Alm ORN, MD;  Location: [facility];  Service: Cardiovascular;  Laterality: N/A;   EYE SURGERY Bilateral    implant insertion   HERNIA REPAIR     x2   LEFT HEART CATH AND CORONARY ANGIOGRAPHY N/A 11/25/2021   Procedure: LEFT HEART CATH AND CORONARY ANGIOGRAPHY;  Surgeon: Clay Alm ORN, MD;  Location: [facility]::   Angiographically Mod 1 V CAD w/ Dist LCx 60% - RFR NEGATIVE lesion.   Prox RCA 20%.  Mid RCA 10%.  Prox LAD to Mid LAD 15% . Normal LV function-EF~50 to 55%, with mildly elevated LVEDP. LV- gram suggest possible anterior wall motion abnormality.  REC: TTE to better assess.    Allergies  No Known Allergies  History of Present Illness    ***  Home Medications    Prior to Admission medications   Medication Sig Start Date End Date Taking? Authorizing Provider  acetaminophen  (TYLENOL ) 325 MG tablet Take 2 tablets (650 mg total) by mouth every 6 (six) hours as needed for mild pain (pain score 1-3) (or Fever >/= 101). 05/08/23   Sebastian Toribio GAILS, MD  albuterol  (VENTOLIN  HFA) 108 [id number] Base) MCG/ACT inhaler Inhale 1-2 puffs into the lungs every 4 (four) hours as needed for wheezing or shortness of breath. 01/04/24   Joesph Annabella HERO, FNP  atorvastatin  (LIPITOR) 80 MG tablet Take 1 tablet (80 mg total) by  mouth daily. 04/25/23   Joesph Annabella HERO, FNP  azithromycin  (ZITHROMAX  Z-PAK) 250 MG tablet As directed 01/23/24   Joesph Annabella HERO, FNP  chlorthalidone  (HYGROTON ) 25 MG tablet Take 0.5 tablets (12.5 mg total) by mouth daily. 09/02/23   Clay Alm ORN, MD  cyanocobalamin  (VITAMIN B12) 1000 MCG tablet Take 1 tablet (1,000 mcg total) by mouth daily. 01/25/24   Joesph Annabella HERO, FNP  Fluticasone -Umeclidin-Vilant (TRELEGY ELLIPTA ) 100-62.5-25 MCG/ACT AEPB Inhale 1 Inhalation into the lungs daily. 07/28/22   Joesph Annabella HERO, FNP  folic acid  (FOLVITE ) 1 MG tablet Take 1 tablet (1 mg total) by mouth daily. 05/09/23   Sebastian Toribio GAILS, MD  gabapentin  (NEURONTIN ) 600 MG tablet TAKE 1/2 (ONE-HALF) TABLET BY MOUTH IN THE MORNING AND 1 AT BEDTIME 01/02/24   Joesph Annabella HERO, FNP  levocetirizine (XYZAL ) 5 MG tablet Take 1 tablet (5 mg total) by mouth every evening. 01/23/24   Joesph Annabella HERO, FNP  meloxicam  (MOBIC ) 15 MG tablet Take 1 tablet by mouth once daily 11/02/23   Joesph Annabella HERO, FNP  metFORMIN  (GLUCOPHAGE ) 1000 MG tablet Take 1 tablet (1,000 mg total) by mouth 2 (two) times daily with a meal. 12/20/22   Joesph Annabella HERO, FNP  metoprolol  succinate (TOPROL  XL) 25 MG 24 hr tablet Take 1 tablet (25 mg total) by mouth daily.  12/02/23   Anner Alm ORN, MD  Multiple Vitamin (MULTIVITAMIN WITH MINERALS) TABS tablet Take 1 tablet by mouth daily. 05/09/23   Sebastian Toribio GAILS, MD  Omega-3 Fatty Acids (FISH OIL ) 1000 MG CPDR Take 1,000 mg by mouth daily. And then increase to 3000 mg daily after 3 weeks 03/19/22   Anner Alm ORN, MD  omeprazole  (PRILOSEC) 40 MG capsule Take 40 mg by mouth daily. 06/16/23   [provider]  ondansetron  (ZOFRAN ) 4 MG tablet Take 1 tablet (4 mg total) by mouth every 6 (six) hours as needed for nausea. 05/08/23   Sebastian Toribio GAILS, MD  sertraline  (ZOLOFT ) 100 MG tablet Take 1.5 tablets (150 mg total) by mouth daily. 07/11/23   Joesph Annabella HERO, FNP  thiamine  (VITAMIN B-1)  100 MG tablet Take 1 tablet (100 mg total) by mouth daily. 05/09/23   Sebastian Toribio GAILS, MD  traZODone  (DESYREL ) 50 MG tablet Take 0.5-1 tablets (25-50 mg total) by mouth at bedtime as needed for sleep. 10/12/23   Joesph Annabella HERO, FNP    Family History    Family History  Problem Relation Age of Onset   Stroke Mother    Hyperlipidemia Mother    Cancer Mother        throat cancer   Hypertension Mother    Atrial fibrillation Mother    Hypertension Father    Hyperlipidemia Father    COPD Father    Heart disease Father    AAA (abdominal aortic aneurysm) Father    Heart attack Father    Diabetes Sister    Heart murmur Sister    Hearing loss Daughter    He indicated that his mother is alive. He indicated that his father is deceased. He indicated that his sister is alive. He indicated that his brother is alive. He indicated that his maternal grandmother is deceased. He indicated that his maternal grandfather is deceased. He indicated that his paternal grandmother is deceased. He indicated that his paternal grandfather is deceased. He indicated that his daughter is alive.  Social History    Social History   Socioeconomic History   Marital status: Married    Spouse name: Apolinar   Number of children: 1   Years of education: 7   Highest education level: 7th grade  Occupational History   Not on file  Tobacco Use   Smoking status: Every Day    Current packs/day: 3.00    Average packs/day: 3.0 packs/day for 45.0 years (135.0 ttl pk-yrs)    Types: Cigarettes   Smokeless tobacco: Never   Tobacco comments:    Started smoking at 16  Vaping Use   Vaping status: Never Used  Substance and Sexual Activity   Alcohol use: Yes    Alcohol/week: 5.0 standard drinks of alcohol    Types: 5 Shots of liquor per week    Comment: drinks a couple shots one night a week   Drug use: No   Sexual activity: Yes    Birth control/protection: None  Other Topics Concern   Not on file  Social History  Narrative   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   Still smokes.   Social Drivers of Corporate investment banker Strain: Not on file  Food Insecurity: No Food Insecurity (05/07/2023)   Hunger Vital Sign    Worried About Running Out of Food in the Last Year: Never true    Ran Out of Food in the Last Year: Never true  Transportation Needs: No Transportation Needs (05/07/2023)   PRAPARE - Administrator, Civil Service (Medical): No    Lack of Transportation (Non-Medical): No  Physical Activity: Not on file  Stress: Not on file  Social Connections: Not on file  Intimate Partner Violence: Not At Risk (05/07/2023)   Humiliation, Afraid, Rape, and Kick questionnaire    Fear of Current or Ex-Partner: No    Emotionally Abused: No    Physically Abused: No    Sexually Abused: No     Review of Systems    General:  No chills, fever, night sweats or weight changes.  Cardiovascular:  No chest pain, dyspnea on exertion, edema, orthopnea, palpitations, paroxysmal nocturnal dyspnea. Dermatological: No rash, lesions/masses Respiratory: No cough, dyspnea Urologic: No hematuria, dysuria Abdominal:   No nausea, vomiting, diarrhea, bright red blood per rectum, melena, or hematemesis Neurologic:  No visual changes, wkns, changes in mental status. All other systems reviewed and are otherwise negative except as noted above.  Physical Exam    VS:  There were no vitals taken for this visit. , BMI There is no height or weight on file to calculate BMI. GEN: Well nourished, well developed, in no acute distress. HEENT: normal. Neck: Supple, no JVD, carotid bruits, or masses. Cardiac: RRR, no murmurs, rubs, or gallops. No clubbing, cyanosis, edema.  Radials/DP/PT 2+ and equal bilaterally.  Respiratory:  Respirations regular and unlabored, clear to auscultation bilaterally. GI: Soft, nontender, nondistended, BS + x 4. MS: no deformity or atrophy. Skin: warm and dry,  no rash. Neuro:  Strength and sensation are intact. Psych: Normal affect.  Accessory Clinical Findings    Recent Labs: 05/07/2023: TSH 2.256 06/10/2023: Magnesium  1.7 01/23/2024: ALT 18; BUN 10; Creatinine, Ser 0.98; Hemoglobin 12.5; Platelets 325; Potassium 4.3; Sodium 131   Recent Lipid Panel    Component Value Date/Time   CHOL 129 01/23/2024 1417   TRIG 188 (H) 01/23/2024 1417   HDL 47 01/23/2024 1417   CHOLHDL 2.7 01/23/2024 1417   CHOLHDL 3.1 05/08/2023 0531   VLDL 21 05/08/2023 0531   LDLCALC 51 01/23/2024 1417    No BP recorded.  {Refresh Note OR Click here to enter BP  :1}***    ECG personally reviewed by me today- ***          Assessment & Plan   1.  ***   Josefa HERO. Noga Fogg NP-C     01/31/2024, 7:32 AM [facility] [address] [place], [place] [address] Office [phone]      I spent***minutes examining this patient, reviewing medications, and using patient centered shared decision making involving their cardiac care.   I spent  20 minutes reviewing past medical history,  medications, and prior cardiac tests.

## 2024-02-05 ENCOUNTER — Other Ambulatory Visit: Payer: Self-pay | Admitting: Family Medicine

## 2024-02-05 DIAGNOSIS — G8929 Other chronic pain: Secondary | ICD-10-CM

## 2024-02-07 ENCOUNTER — Ambulatory Visit: Admitting: General Practice

## 2024-02-08 ENCOUNTER — Other Ambulatory Visit

## 2024-02-13 ENCOUNTER — Other Ambulatory Visit: Payer: Self-pay | Admitting: Family Medicine

## 2024-02-13 DIAGNOSIS — E1140 Type 2 diabetes mellitus with diabetic neuropathy, unspecified: Secondary | ICD-10-CM

## 2024-02-17 ENCOUNTER — Other Ambulatory Visit

## 2024-02-29 ENCOUNTER — Encounter (INDEPENDENT_AMBULATORY_CARE_PROVIDER_SITE_OTHER): Payer: Self-pay | Admitting: Gastroenterology

## 2024-03-10 ENCOUNTER — Other Ambulatory Visit: Payer: Self-pay | Admitting: Family Medicine

## 2024-03-10 DIAGNOSIS — E1140 Type 2 diabetes mellitus with diabetic neuropathy, unspecified: Secondary | ICD-10-CM

## 2024-03-10 DIAGNOSIS — K219 Gastro-esophageal reflux disease without esophagitis: Secondary | ICD-10-CM

## 2024-03-13 ENCOUNTER — Ambulatory Visit: Admitting: General Practice

## 2024-03-20 ENCOUNTER — Other Ambulatory Visit: Payer: Self-pay

## 2024-03-20 ENCOUNTER — Emergency Department (HOSPITAL_COMMUNITY)

## 2024-03-20 ENCOUNTER — Encounter (HOSPITAL_COMMUNITY): Payer: Self-pay

## 2024-03-20 ENCOUNTER — Observation Stay (HOSPITAL_COMMUNITY)
Admission: EM | Admit: 2024-03-20 | Discharge: 2024-03-21 | Disposition: A | Discharge status: Home / Self Care | Attending: Family Medicine | Admitting: Family Medicine

## 2024-03-20 DIAGNOSIS — F17210 Nicotine dependence, cigarettes, uncomplicated: Secondary | ICD-10-CM | POA: Diagnosis not present

## 2024-03-20 DIAGNOSIS — R55 Syncope and collapse: Principal | ICD-10-CM | POA: Diagnosis present

## 2024-03-20 DIAGNOSIS — K219 Gastro-esophageal reflux disease without esophagitis: Secondary | ICD-10-CM | POA: Diagnosis not present

## 2024-03-20 DIAGNOSIS — E119 Type 2 diabetes mellitus without complications: Secondary | ICD-10-CM | POA: Diagnosis not present

## 2024-03-20 DIAGNOSIS — E871 Hypo-osmolality and hyponatremia: Secondary | ICD-10-CM | POA: Insufficient documentation

## 2024-03-20 DIAGNOSIS — M25562 Pain in left knee: Secondary | ICD-10-CM | POA: Diagnosis not present

## 2024-03-20 DIAGNOSIS — J439 Emphysema, unspecified: Secondary | ICD-10-CM | POA: Diagnosis not present

## 2024-03-20 DIAGNOSIS — E782 Mixed hyperlipidemia: Secondary | ICD-10-CM | POA: Diagnosis not present

## 2024-03-20 DIAGNOSIS — J449 Chronic obstructive pulmonary disease, unspecified: Secondary | ICD-10-CM | POA: Insufficient documentation

## 2024-03-20 DIAGNOSIS — R7303 Prediabetes: Secondary | ICD-10-CM | POA: Diagnosis not present

## 2024-03-20 DIAGNOSIS — S6992XA Unspecified injury of left wrist, hand and finger(s), initial encounter: Secondary | ICD-10-CM | POA: Diagnosis not present

## 2024-03-20 DIAGNOSIS — M79642 Pain in left hand: Secondary | ICD-10-CM | POA: Diagnosis not present

## 2024-03-20 DIAGNOSIS — F1090 Alcohol use, unspecified, uncomplicated: Secondary | ICD-10-CM | POA: Insufficient documentation

## 2024-03-20 DIAGNOSIS — J40 Bronchitis, not specified as acute or chronic: Secondary | ICD-10-CM | POA: Diagnosis not present

## 2024-03-20 DIAGNOSIS — R059 Cough, unspecified: Secondary | ICD-10-CM | POA: Diagnosis not present

## 2024-03-20 DIAGNOSIS — M438X2 Other specified deforming dorsopathies, cervical region: Secondary | ICD-10-CM | POA: Diagnosis not present

## 2024-03-20 DIAGNOSIS — K4090 Unilateral inguinal hernia, without obstruction or gangrene, not specified as recurrent: Secondary | ICD-10-CM | POA: Diagnosis not present

## 2024-03-20 DIAGNOSIS — M85842 Other specified disorders of bone density and structure, left hand: Secondary | ICD-10-CM | POA: Diagnosis not present

## 2024-03-20 DIAGNOSIS — M25519 Pain in unspecified shoulder: Secondary | ICD-10-CM | POA: Diagnosis not present

## 2024-03-20 DIAGNOSIS — I10 Essential (primary) hypertension: Secondary | ICD-10-CM | POA: Insufficient documentation

## 2024-03-20 DIAGNOSIS — R0789 Other chest pain: Secondary | ICD-10-CM | POA: Diagnosis not present

## 2024-03-20 DIAGNOSIS — M542 Cervicalgia: Secondary | ICD-10-CM | POA: Insufficient documentation

## 2024-03-20 DIAGNOSIS — Z79899 Other long term (current) drug therapy: Secondary | ICD-10-CM | POA: Diagnosis not present

## 2024-03-20 DIAGNOSIS — S069X9A Unspecified intracranial injury with loss of consciousness of unspecified duration, initial encounter: Secondary | ICD-10-CM | POA: Diagnosis not present

## 2024-03-20 DIAGNOSIS — R609 Edema, unspecified: Secondary | ICD-10-CM | POA: Diagnosis not present

## 2024-03-20 DIAGNOSIS — I700 Atherosclerosis of aorta: Secondary | ICD-10-CM | POA: Diagnosis not present

## 2024-03-20 DIAGNOSIS — S60222A Contusion of left hand, initial encounter: Secondary | ICD-10-CM | POA: Diagnosis not present

## 2024-03-20 DIAGNOSIS — R079 Chest pain, unspecified: Secondary | ICD-10-CM | POA: Diagnosis not present

## 2024-03-20 DIAGNOSIS — S199XXA Unspecified injury of neck, initial encounter: Secondary | ICD-10-CM | POA: Diagnosis not present

## 2024-03-20 DIAGNOSIS — M47812 Spondylosis without myelopathy or radiculopathy, cervical region: Secondary | ICD-10-CM | POA: Diagnosis not present

## 2024-03-20 DIAGNOSIS — K5730 Diverticulosis of large intestine without perforation or abscess without bleeding: Secondary | ICD-10-CM | POA: Diagnosis not present

## 2024-03-20 LAB — COMPREHENSIVE METABOLIC PANEL WITH GFR
ALT: 27 U/L (ref 0–44)
AST: 36 U/L (ref 15–41)
Albumin: 4.7 g/dL (ref 3.5–5.0)
Alkaline Phosphatase: 125 U/L (ref 38–126)
Anion gap: 12 (ref 5–15)
BUN: 12 mg/dL (ref 8–23)
CO2: 27 mmol/L (ref 22–32)
Calcium: 9.4 mg/dL (ref 8.9–10.3)
Chloride: 89 mmol/L — ABNORMAL LOW (ref 98–111)
Creatinine, Ser: 0.89 mg/dL (ref 0.61–1.24)
GFR, Estimated: >60 mL/min (ref >60)
Glucose, Bld: 118 mg/dL — ABNORMAL HIGH (ref 70–99)
Potassium: 4.2 mmol/L (ref 3.5–5.1)
Sodium: 127 mmol/L — ABNORMAL LOW (ref 135–145)
Total Bilirubin: 0.5 mg/dL (ref 0.0–1.2)
Total Protein: 8.2 g/dL — ABNORMAL HIGH (ref 6.5–8.1)

## 2024-03-20 LAB — CBC WITH DIFFERENTIAL/PLATELET
Abs Immature Granulocytes: 0.09 K/uL — ABNORMAL HIGH (ref 0.00–0.07)
Basophils Absolute: 0 K/uL (ref 0.0–0.1)
Basophils Relative: 0 %
Eosinophils Absolute: 0.1 K/uL (ref 0.0–0.5)
Eosinophils Relative: 1 %
HCT: 39.4 % (ref 39.0–52.0)
Hemoglobin: 13.2 g/dL (ref 13.0–17.0)
Immature Granulocytes: 1 %
Lymphocytes Relative: 6 %
Lymphs Abs: 1 K/uL (ref 0.7–4.0)
MCH: 28.9 pg (ref 26.0–34.0)
MCHC: 33.5 g/dL (ref 30.0–36.0)
MCV: 86.2 fL (ref 80.0–100.0)
Monocytes Absolute: 0.8 K/uL (ref 0.1–1.0)
Monocytes Relative: 6 %
Neutro Abs: 13.1 K/uL — ABNORMAL HIGH (ref 1.7–7.7)
Neutrophils Relative %: 86 %
Platelets: 366 K/uL (ref 150–400)
RBC: 4.57 MIL/uL (ref 4.22–5.81)
RDW: 14.8 % (ref 11.5–15.5)
WBC: 15.1 K/uL — ABNORMAL HIGH (ref 4.0–10.5)
nRBC: 0 % (ref 0.0–0.2)

## 2024-03-20 LAB — TROPONIN T, HIGH SENSITIVITY
Troponin T High Sensitivity: <15 ng/L (ref 0–19)
Troponin T High Sensitivity: <15 ng/L (ref 0–19)

## 2024-03-20 LAB — I-STAT CHEM 8, ED
BUN: 12 mg/dL (ref 8–23)
Calcium, Ion: 1.15 mmol/L (ref 1.15–1.40)
Chloride: 90 mmol/L — ABNORMAL LOW (ref 98–111)
Creatinine, Ser: 1 mg/dL (ref 0.61–1.24)
Glucose, Bld: 108 mg/dL — ABNORMAL HIGH (ref 70–99)
HCT: 44 % (ref 39.0–52.0)
Hemoglobin: 15 g/dL (ref 13.0–17.0)
Potassium: 4.1 mmol/L (ref 3.5–5.1)
Sodium: 128 mmol/L — ABNORMAL LOW (ref 135–145)
TCO2: 25 mmol/L (ref 22–32)

## 2024-03-20 LAB — URINE DRUG SCREEN
Amphetamines: NEGATIVE
Barbiturates: NEGATIVE
Benzodiazepines: NEGATIVE
Cocaine: NEGATIVE
Fentanyl: NEGATIVE
Methadone Scn, Ur: NEGATIVE
Opiates: NEGATIVE
Tetrahydrocannabinol: NEGATIVE

## 2024-03-20 LAB — LIPASE, BLOOD: Lipase: 57 U/L — ABNORMAL HIGH (ref 11–51)

## 2024-03-20 LAB — CBG MONITORING, ED: Glucose-Capillary: 105 mg/dL — ABNORMAL HIGH (ref 70–99)

## 2024-03-20 LAB — ETHANOL: Alcohol, Ethyl (B): <15 mg/dL (ref <15)

## 2024-03-20 MED ORDER — ACETAMINOPHEN 325 MG PO TABS
650.0000 mg | ORAL_TABLET | Freq: Four times a day (QID) | ORAL | Status: DC | PRN
  Administered 2024-03-20 – 2024-03-21 (×2): 650 mg via ORAL
  Filled 2024-03-20 (×2): qty 2

## 2024-03-20 MED ORDER — ACETAMINOPHEN 650 MG RE SUPP: 650.0000 mg | Freq: Four times a day (QID) | RECTAL | Status: DC | PRN

## 2024-03-20 MED ORDER — ONDANSETRON HCL 4 MG/2ML IJ SOLN: 4.0000 mg | Freq: Four times a day (QID) | INTRAMUSCULAR | Status: DC | PRN

## 2024-03-20 MED ORDER — SODIUM CHLORIDE 0.9 % IV BOLUS
1000.0000 mL | Freq: Once | INTRAVENOUS | Status: AC
  Administered 2024-03-20: 1000 mL via INTRAVENOUS

## 2024-03-20 MED ORDER — IOHEXOL 300 MG/ML  SOLN
100.0000 mL | Freq: Once | INTRAMUSCULAR | Status: AC | PRN
  Administered 2024-03-20: 100 mL via INTRAVENOUS

## 2024-03-20 MED ORDER — ONDANSETRON HCL 4 MG PO TABS: 4.0000 mg | ORAL_TABLET | Freq: Four times a day (QID) | ORAL | Status: DC | PRN

## 2024-03-20 MED ORDER — NICOTINE 21 MG/24HR TD PT24
21.0000 mg | MEDICATED_PATCH | Freq: Once | TRANSDERMAL | Status: AC
  Administered 2024-03-20: 21 mg via TRANSDERMAL
  Filled 2024-03-20: qty 1

## 2024-03-20 MED ORDER — MORPHINE SULFATE (PF) 4 MG/ML IV SOLN
4.0000 mg | Freq: Once | INTRAVENOUS | Status: AC
  Administered 2024-03-20: 4 mg via INTRAVENOUS
  Filled 2024-03-20: qty 1

## 2024-03-20 MED ORDER — OXYCODONE-ACETAMINOPHEN 5-325 MG PO TABS
1.0000 | ORAL_TABLET | Freq: Once | ORAL | Status: AC
  Administered 2024-03-20: 1 via ORAL
  Filled 2024-03-20: qty 1
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
## 2024-03-21 ENCOUNTER — Telehealth: Payer: Self-pay

## 2024-03-21 ENCOUNTER — Observation Stay (HOSPITAL_COMMUNITY)

## 2024-03-21 DIAGNOSIS — R55 Syncope and collapse: Secondary | ICD-10-CM

## 2024-03-21 DIAGNOSIS — I6523 Occlusion and stenosis of bilateral carotid arteries: Secondary | ICD-10-CM | POA: Diagnosis not present

## 2024-03-21 LAB — GLUCOSE, CAPILLARY
Glucose-Capillary: 136 mg/dL — ABNORMAL HIGH (ref 70–99)
Glucose-Capillary: 167 mg/dL — ABNORMAL HIGH (ref 70–99)

## 2024-03-21 LAB — ECHOCARDIOGRAM COMPLETE
AR max vel: 3.74 cm2
AV Area VTI: 3.7 cm2
AV Area mean vel: 3.47 cm2
AV Mean grad: 3 mmHg
AV Peak grad: 5.8 mmHg
Ao pk vel: 1.2 m/s
Area-P 1/2: 3.5 cm2
Calc EF: 60.1 %
Height: 67 in
MV VTI: 3.48 cm2
S' Lateral: 2.8 cm
Single Plane A2C EF: 63.5 %
Single Plane A4C EF: 56.2 %
Weight: 2272 [oz_av]

## 2024-03-21 LAB — CBC
HCT: 35.7 % — ABNORMAL LOW (ref 39.0–52.0)
Hemoglobin: 12 g/dL — ABNORMAL LOW (ref 13.0–17.0)
MCH: 28.8 pg (ref 26.0–34.0)
MCHC: 33.6 g/dL (ref 30.0–36.0)
MCV: 85.8 fL (ref 80.0–100.0)
Platelets: 350 K/uL (ref 150–400)
RBC: 4.16 MIL/uL — ABNORMAL LOW (ref 4.22–5.81)
RDW: 14.9 % (ref 11.5–15.5)
WBC: 10.6 K/uL — ABNORMAL HIGH (ref 4.0–10.5)
nRBC: 0 % (ref 0.0–0.2)

## 2024-03-21 LAB — BASIC METABOLIC PANEL WITH GFR
Anion gap: 11 (ref 5–15)
BUN: 11 mg/dL (ref 8–23)
CO2: 26 mmol/L (ref 22–32)
Calcium: 9.5 mg/dL (ref 8.9–10.3)
Chloride: 93 mmol/L — ABNORMAL LOW (ref 98–111)
Creatinine, Ser: 0.87 mg/dL (ref 0.61–1.24)
GFR, Estimated: >60 mL/min (ref >60)
Glucose, Bld: 133 mg/dL — ABNORMAL HIGH (ref 70–99)
Potassium: 4.2 mmol/L (ref 3.5–5.1)
Sodium: 130 mmol/L — ABNORMAL LOW (ref 135–145)

## 2024-03-21 LAB — COMPREHENSIVE METABOLIC PANEL WITH GFR
ALT: 22 U/L (ref 0–44)
AST: 27 U/L (ref 15–41)
Albumin: 4 g/dL (ref 3.5–5.0)
Alkaline Phosphatase: 109 U/L (ref 38–126)
Anion gap: 11 (ref 5–15)
BUN: 11 mg/dL (ref 8–23)
CO2: 26 mmol/L (ref 22–32)
Calcium: 9.1 mg/dL (ref 8.9–10.3)
Chloride: 95 mmol/L — ABNORMAL LOW (ref 98–111)
Creatinine, Ser: 1.06 mg/dL (ref 0.61–1.24)
GFR, Estimated: >60 mL/min (ref >60)
Glucose, Bld: 115 mg/dL — ABNORMAL HIGH (ref 70–99)
Potassium: 3.9 mmol/L (ref 3.5–5.1)
Sodium: 132 mmol/L — ABNORMAL LOW (ref 135–145)
Total Bilirubin: 0.3 mg/dL (ref 0.0–1.2)
Total Protein: 7 g/dL (ref 6.5–8.1)

## 2024-03-21 LAB — MAGNESIUM: Magnesium: 1.9 mg/dL (ref 1.7–2.4)

## 2024-03-21 LAB — HEMOGLOBIN A1C
Hgb A1c MFr Bld: 6.7 % — ABNORMAL HIGH (ref 4.8–5.6)
Mean Plasma Glucose: 145.59 mg/dL

## 2024-03-21 LAB — OSMOLALITY, URINE: Osmolality, Ur: 250 mosm/kg — ABNORMAL LOW (ref 300–900)

## 2024-03-21 LAB — PHOSPHORUS: Phosphorus: 3.4 mg/dL (ref 2.5–4.6)

## 2024-03-21 LAB — SODIUM, URINE, RANDOM: Sodium, Ur: 55 mmol/L

## 2024-03-21 LAB — OSMOLALITY: Osmolality: 276 mosm/kg (ref 275–295)

## 2024-03-21 MED ORDER — INSULIN ASPART 100 UNIT/ML IJ SOLN
0.0000 [IU] | Freq: Three times a day (TID) | INTRAMUSCULAR | Status: DC
  Administered 2024-03-21: 1 [IU] via SUBCUTANEOUS
  Filled 2024-03-21: qty 1

## 2024-03-21 MED ORDER — INSULIN ASPART 100 UNIT/ML IJ SOLN
0.0000 [IU] | Freq: Three times a day (TID) | INTRAMUSCULAR | Status: DC
  Administered 2024-03-21: 2 [IU] via SUBCUTANEOUS
  Filled 2024-03-21: qty 1

## 2024-03-21 NOTE — Telephone Encounter (Signed)
 Per FABIENE Come, MD (hospitalist): could you please help me with arranging a cardiac monitor for this patient. It's for syncope, recommend 30 days. He's established with Cardiology in [place].    [employer] monitor ordered.
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
## 2024-03-21 NOTE — TOC CM/SW Note (Signed)
 Transition of Care [facility]) - Inpatient Brief Assessment   Patient Details  Name: Larry Floyd MRN: [id number] Date of Birth: [birth year]  Transition of [facility] CM/SW Contact:    Noreen KATHEE Pinal, LCSWA Phone Number: 03/21/2024, 12:50 PM   Clinical Narrative:  Inpatient Care Management (ICM) has reviewed patient and no other ICM needs have been identified at this time. We will continue to monitor patient advancement through interdisciplinary progression rounds. If new patient transition needs arise, please place a ICM consult.  Transition of Care Asessment: Insurance and Status: Insurance coverage has been reviewed Patient has primary care physician: Yes Home environment has been reviewed: Single Family Home Prior level of function:: Independent Prior/Current Home Services: No current home services Social Drivers of Health Review: SDOH reviewed no interventions necessary Readmission risk has been reviewed: Yes Transition of care needs: no transition of care needs at this time

## 2024-03-21 NOTE — Plan of Care (Signed)
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
## 2024-03-27 NOTE — Telephone Encounter (Signed)
 Wife of the patient called. The patient's heart monitor came in the mail on 03/27/2024 and is having difficulty putting the monitor on. Has attempted to reach out to Cariology; not able to reach the Cardiologist's Practice. If able, please call the patient's wife back to discuss.  Phone: [phone]

## 2024-03-27 NOTE — Telephone Encounter (Signed)
 Called pt and they already got it taken care of. LS

## 2024-04-01 ENCOUNTER — Other Ambulatory Visit: Payer: Self-pay | Admitting: *Deleted

## 2024-04-01 DIAGNOSIS — E1140 Type 2 diabetes mellitus with diabetic neuropathy, unspecified: Secondary | ICD-10-CM

## 2024-04-11 ENCOUNTER — Ambulatory Visit: Admitting: General Practice

## 2024-04-25 ENCOUNTER — Ambulatory Visit: Payer: Self-pay | Admitting: Family Medicine

## 2024-04-27 ENCOUNTER — Ambulatory Visit: Attending: Cardiology

## 2024-04-27 DIAGNOSIS — R55 Syncope and collapse: Secondary | ICD-10-CM

## 2024-05-04 ENCOUNTER — Ambulatory Visit: Payer: Self-pay | Admitting: Cardiology

## 2024-05-04 DIAGNOSIS — R55 Syncope and collapse: Secondary | ICD-10-CM | POA: Diagnosis not present

## 2024-05-19 ENCOUNTER — Other Ambulatory Visit: Payer: Self-pay | Admitting: Family Medicine

## 2024-05-19 DIAGNOSIS — G8929 Other chronic pain: Secondary | ICD-10-CM

## 2024-05-29 ENCOUNTER — Other Ambulatory Visit: Payer: Self-pay | Admitting: Family Medicine

## 2024-05-29 ENCOUNTER — Other Ambulatory Visit: Payer: Self-pay | Admitting: Cardiology

## 2024-05-29 DIAGNOSIS — E1140 Type 2 diabetes mellitus with diabetic neuropathy, unspecified: Secondary | ICD-10-CM

## 2024-05-29 DIAGNOSIS — J432 Centrilobular emphysema: Secondary | ICD-10-CM
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
## 2024-06-01 ENCOUNTER — Ambulatory Visit

## 2024-06-01 ENCOUNTER — Ambulatory Visit: Admitting: General Practice

## 2024-06-04 ENCOUNTER — Ambulatory Visit: Admitting: Family Medicine

## 2024-06-04 ENCOUNTER — Telehealth: Payer: Self-pay | Admitting: Family Medicine

## 2024-06-04 NOTE — Telephone Encounter (Signed)
 Copied from CRM [id number]. Topic: Appointments - Scheduling Inquiry for Clinic >> Jun 04, 2024  8:45 AM Larry Floyd wrote: Reason for CRM: Patient's spouse, Larry Floyd, called in to reschedule patient's appt for today since it was cancelled due to provider being out sick. Offered the next available appts with Larry Floyd or Larry Floyd for 06/05/24 but she stated he needed to see someone today. Informed her that there is no availability for today. She's requesting for Larry Floyd to give her a call. CB #: [phone].

## 2024-06-13 ENCOUNTER — Ambulatory Visit: Admitting: Family Medicine

## 2024-06-14 ENCOUNTER — Other Ambulatory Visit: Payer: Self-pay | Admitting: Family Medicine

## 2024-06-14 DIAGNOSIS — K219 Gastro-esophageal reflux disease without esophagitis: Secondary | ICD-10-CM

## 2024-06-15 ENCOUNTER — Ambulatory Visit (INDEPENDENT_AMBULATORY_CARE_PROVIDER_SITE_OTHER): Admitting: Family Medicine

## 2024-06-15 ENCOUNTER — Encounter: Payer: Self-pay | Admitting: Family Medicine

## 2024-06-15 VITALS — BP 110/67 | HR 73 | Temp 97.5°F | Ht 67.0 in | Wt 137.4 lb

## 2024-06-15 DIAGNOSIS — J441 Chronic obstructive pulmonary disease with (acute) exacerbation: Secondary | ICD-10-CM

## 2024-06-15 MED ORDER — DOXYCYCLINE HYCLATE 100 MG PO TABS: 100.0000 mg | ORAL_TABLET | Freq: Two times a day (BID) | ORAL | 0 refills | Status: AC

## 2024-06-15 MED ORDER — PREDNISONE 20 MG PO TABS: 20.0000 mg | ORAL_TABLET | Freq: Every day | ORAL | 0 refills | Status: AC

## 2024-06-15 NOTE — Progress Notes (Signed)
 "  Acute Office Visit  Subjective:     Patient ID: Larry Floyd, male    DOB: 12/25/1960, 64 y.o.   MRN: [id number]  Chief Complaint  Patient presents with   Cough    HPI  History of Present Illness   Larry Floyd is a 64 year old male with COPD who presents with a persistent cough.  Cough and sputum production - Persistent cough for the past 2-3 weeks - Coughing up yellow sputum - Continues to expectorate significant amount of sputum - Uses albuterol  inhaler approximately four times daily due to increased coughing, shortness of breath, and wheezing  Associated symptoms - No fever, chills, head congestion, nasal congestion, ear pain, sore throat, nausea, or vomiting - Dry throat, attributed to diabetes  Current medications for respiratory symptoms - Mucinex  - Albuterol  - Trelegy       ROS As per HPI.      Objective:    BP 110/67   Pulse 73   Temp (!) 97.5 F (36.4 C) (Temporal)   Ht 5' 7 (1.702 m)   Wt 137 lb 6.4 oz (62.3 kg)   SpO2 93%   BMI 21.52 kg/m    Physical Exam Vitals and nursing note reviewed.  Constitutional:      General: He is not in acute distress.    Appearance: He is not ill-appearing, toxic-appearing or diaphoretic.  Cardiovascular:     Rate and Rhythm: Normal rate and regular rhythm.     Pulses: Normal pulses.     Heart sounds: Normal heart sounds. No murmur heard. Pulmonary:     Effort: Pulmonary effort is normal. No respiratory distress.     Breath sounds: Normal breath sounds. No wheezing, rhonchi or rales.  Chest:     Chest wall: No tenderness.  Abdominal:     General: Bowel sounds are normal. There is no distension.     Palpations: Abdomen is soft. There is no mass.     Tenderness: There is no abdominal tenderness. There is no guarding or rebound.  Musculoskeletal:     Cervical back: Neck supple. No tenderness.     Right lower leg: No edema.     Left lower leg: No edema.  Lymphadenopathy:     Cervical: No cervical  adenopathy.  Skin:    General: Skin is warm and dry.  Neurological:     General: No focal deficit present.     Mental Status: He is alert and oriented to person, place, and time.  Psychiatric:        Mood and Affect: Mood normal.        Behavior: Behavior normal.     No results found for any visits on 06/15/24.      Assessment & Plan:   Shishir was seen today for cough.  Diagnoses and all orders for this visit:  COPD with exacerbation (HCC) -     doxycycline  (VIBRA -TABS) 100 MG tablet; Take 1 tablet (100 mg total) by mouth 2 (two) times daily for 7 days. 1 po bid -     predniSONE  (DELTASONE ) 20 MG tablet; Take 1 tablet (20 mg total) by mouth daily with breakfast for 5 days.   Assessment and Plan    Chronic obstructive pulmonary disease with acute exacerbation COPD exacerbation with increased cough, sputum, wheezing, and dyspnea. Decision to treat with antibiotics and steroids based on clinical presentation. - Prescribed doxycycline . - Prescribed prednisone , two pills once daily for five days. - Advised monitoring blood  sugars due to prednisone . - Instructed to return if symptoms do not improve.      Return to office for new or worsening symptoms, or if symptoms persist.   The patient indicates understanding of these issues and agrees with the plan.  Annabella CHRISTELLA Search, FNP   "

## 2024-07-19 ENCOUNTER — Ambulatory Visit: Admitting: General Practice
